# Patient Record
Sex: Female | Born: 1960 | Race: White | Hispanic: No | Marital: Married | Smoking: Never smoker
Health system: Southern US, Community
[De-identification: ages and names within clinical notes are randomized; demographics above are authoritative.]

## PROBLEM LIST (undated history)

## (undated) DIAGNOSIS — C801 Malignant (primary) neoplasm, unspecified: Secondary | ICD-10-CM

## (undated) DIAGNOSIS — F329 Major depressive disorder, single episode, unspecified: Secondary | ICD-10-CM

## (undated) DIAGNOSIS — I10 Essential (primary) hypertension: Secondary | ICD-10-CM

## (undated) DIAGNOSIS — I4891 Unspecified atrial fibrillation: Secondary | ICD-10-CM

## (undated) DIAGNOSIS — M1990 Unspecified osteoarthritis, unspecified site: Secondary | ICD-10-CM

## (undated) DIAGNOSIS — F32A Depression, unspecified: Secondary | ICD-10-CM

## (undated) DIAGNOSIS — M797 Fibromyalgia: Secondary | ICD-10-CM

## (undated) HISTORY — DX: Unspecified atrial fibrillation: I48.91

## (undated) HISTORY — PX: LASER ABLATION: SHX1947

## (undated) HISTORY — PX: TONSILLECTOMY: SUR1361

## (undated) HISTORY — PX: BACK SURGERY: SHX140

## (undated) HISTORY — PX: VARICOSE VEIN SURGERY: SHX832

## (undated) HISTORY — PX: FOOT OSTEOTOMY W/ PLANTAR FASCIA RELEASE: SHX1665

## (undated) HISTORY — DX: Unspecified osteoarthritis, unspecified site: M19.90

## (undated) HISTORY — PX: CARPAL TUNNEL RELEASE: SHX101

---

## 2003-03-06 ENCOUNTER — Encounter: Payer: Self-pay | Admitting: Neurosurgery

## 2003-03-06 ENCOUNTER — Inpatient Hospital Stay (HOSPITAL_COMMUNITY): Admission: RE | Admit: 2003-03-06 | Discharge: 2003-03-07 | Payer: Self-pay | Admitting: Neurosurgery

## 2003-06-18 ENCOUNTER — Other Ambulatory Visit: Admission: RE | Admit: 2003-06-18 | Discharge: 2003-06-18 | Payer: Self-pay | Admitting: *Deleted

## 2005-08-30 ENCOUNTER — Ambulatory Visit (HOSPITAL_COMMUNITY): Admission: RE | Admit: 2005-08-30 | Discharge: 2005-08-30 | Payer: Self-pay | Admitting: Internal Medicine

## 2006-05-23 ENCOUNTER — Ambulatory Visit (HOSPITAL_COMMUNITY): Admission: RE | Admit: 2006-05-23 | Discharge: 2006-05-23 | Payer: Self-pay | Admitting: Neurosurgery

## 2006-05-23 IMAGING — RF IR MYELOGRAM [PERSON_NAME]
13 of 14 series · 13 of 14 positions shown · IV contrast (omnipaque)
Comparison: none

CLINICAL DATA: Low back pain.
 MYELOGRAM ? LUMBAR :
TECHNIQUE: Lumbar puncture was performed by Dr. KLEVER at L3-4 in a left paramedian approach.  
 Contrast:  10 cc Omnipaque 300 IV.
TECHNIQUE: Contrast was maneuvered into the thoracic subarachnoid space.
TECHNIQUE: Multidetector CT imaging of the thoracic spine was performed after intrathecal injection of contrast.  Multiplanar CT image reconstructions were also generated.
TECHNIQUE: Multidetector CT imaging of the lumbar spine was performed after intrathecal injection of contrast.  Multiplanar CT image reconstructions were also generated.

[Series 1: run · 1 of 1 slices shown (1 of 11)]
[im 1/1]
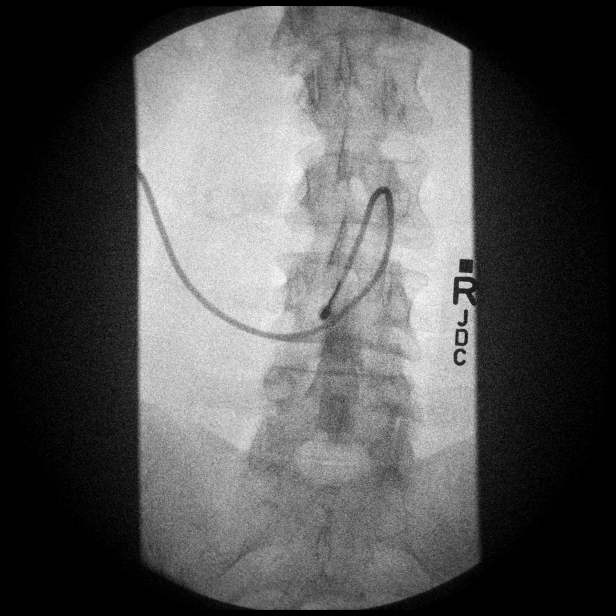

[Series 2: run · 1 of 1 slices shown (2 of 11)]
[im 1/1]
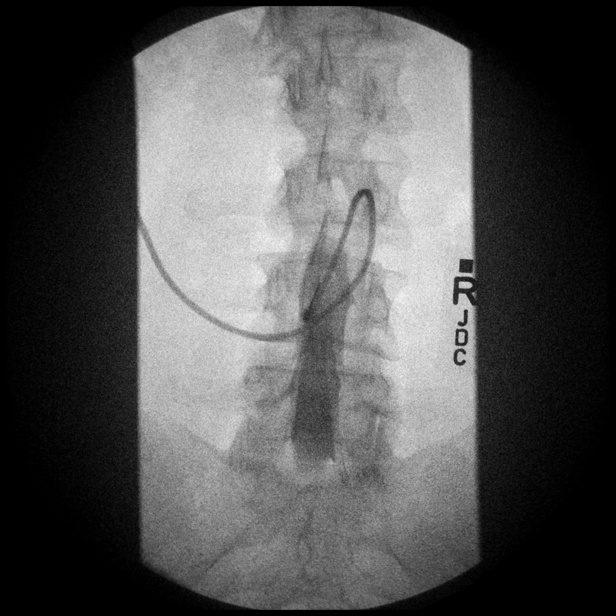

[Series 3: run · 1 of 1 slices shown (3 of 11)]
[im 1/1]
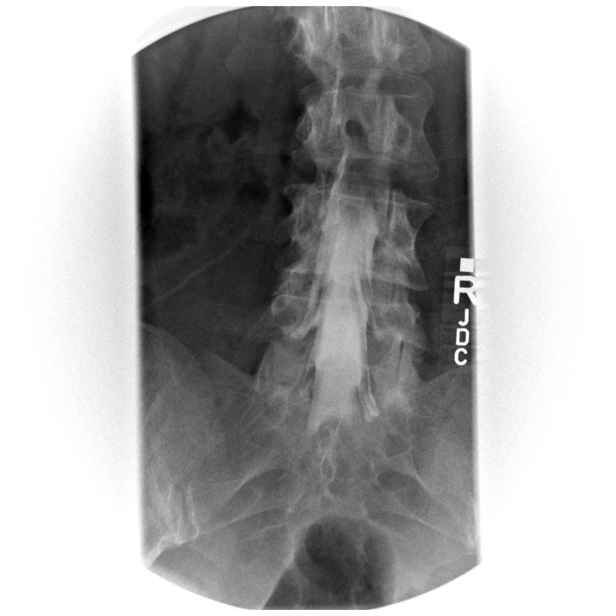

[Series 4: run · 1 of 1 slices shown (4 of 11)]
[im 1/1]
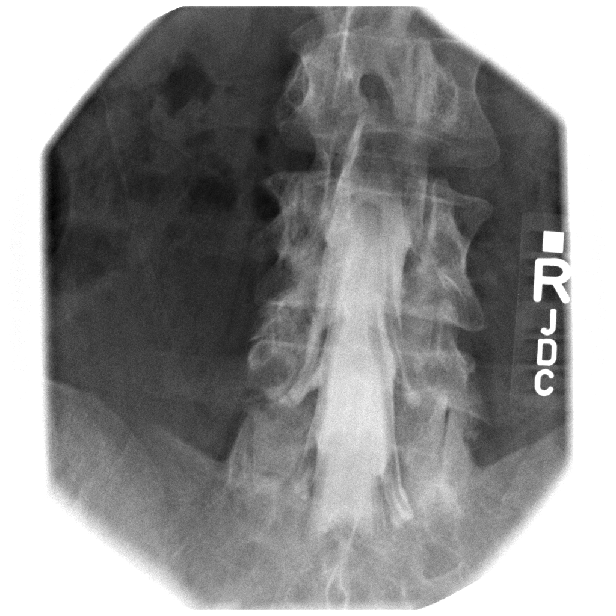

[Series 5: run · 1 of 1 slices shown (5 of 11)]
[im 1/1]
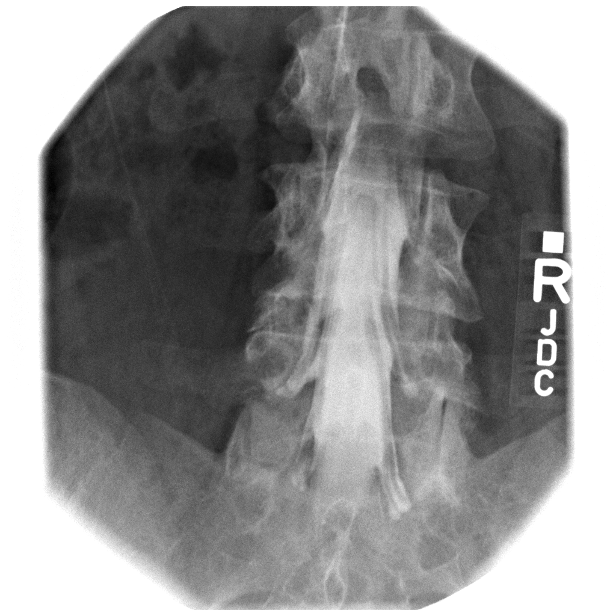

[Series 6: run · 1 of 1 slices shown (6 of 11)]
[im 1/1]
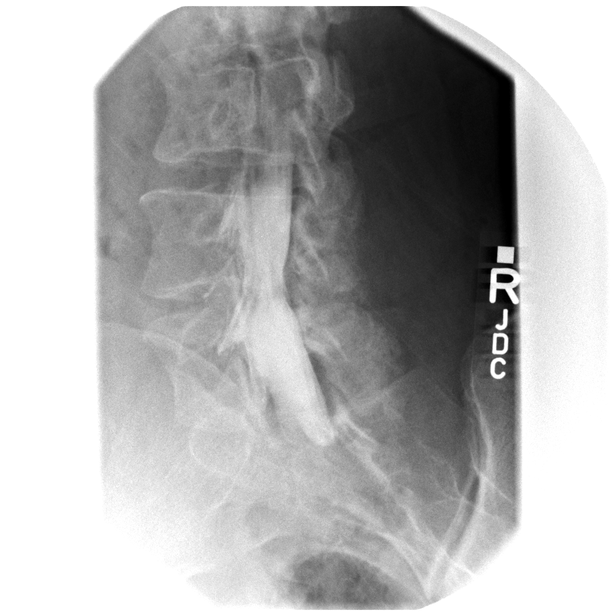

[Series 8: run · 1 of 1 slices shown (7 of 11)]
[im 1/1]
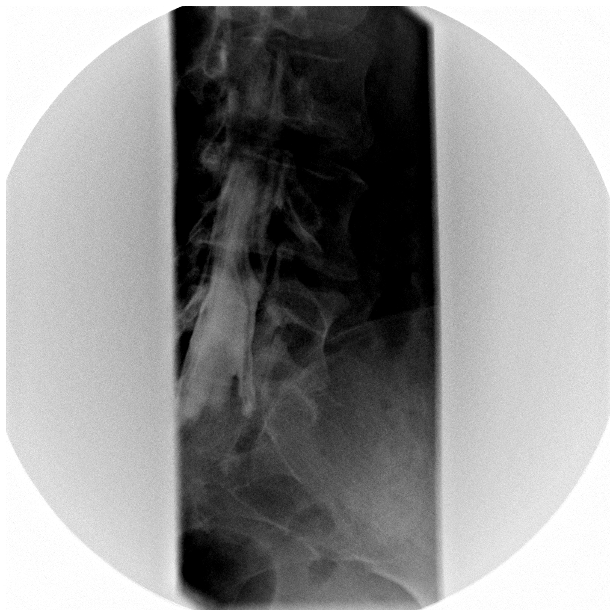

[Series 9: run · 1 of 1 slices shown (8 of 11)]
[im 1/1]
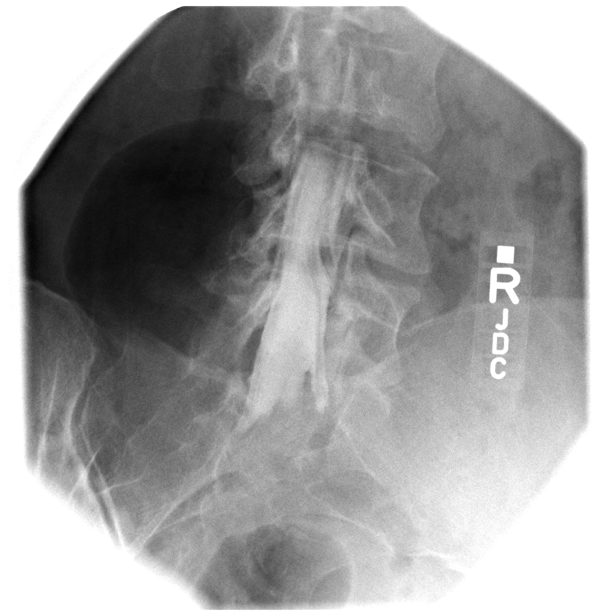

[Series 10: run · 1 of 1 slices shown (9 of 11)]
[im 1/1]
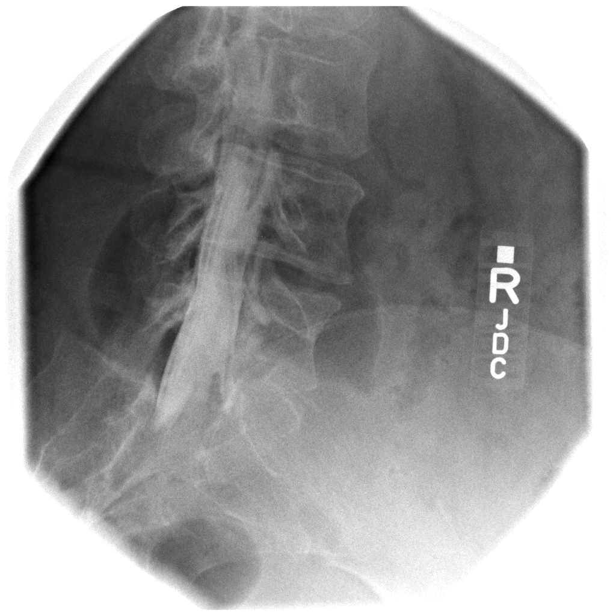

[Series 11: run · 1 of 1 slices shown (10 of 11)]
[im 1/1]
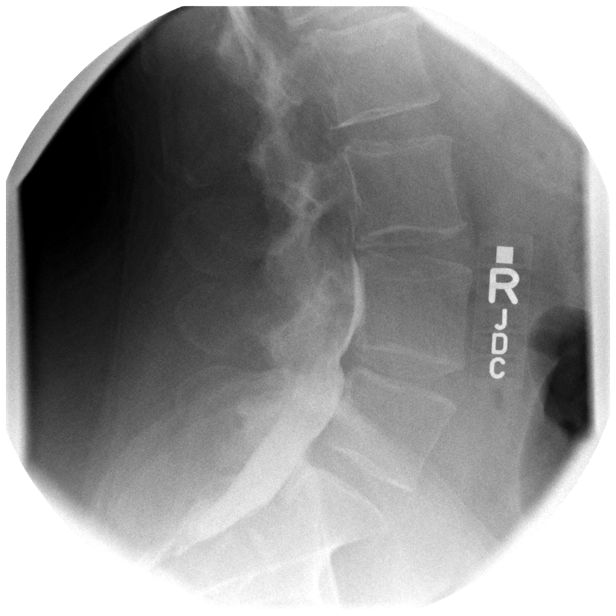

[Series 12: run · 1 of 1 slices shown (11 of 11)]
[im 1/1]
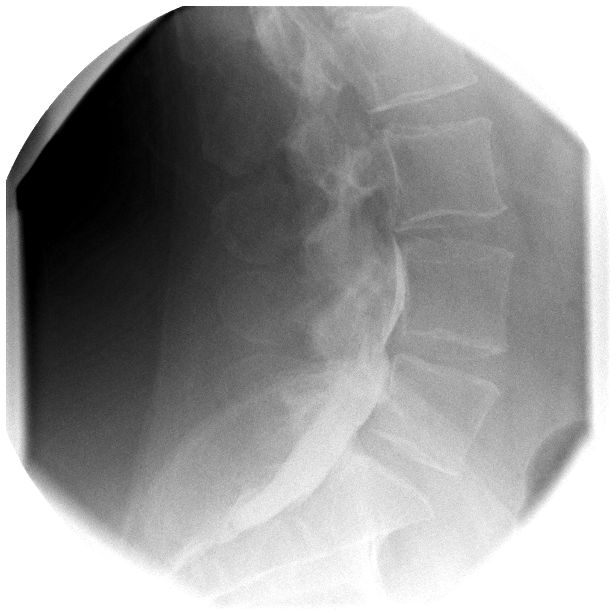

[Series 1001: view not recorded · 0.20mm/px · 1 of 1 slices shown (1 of 2)]
[im 1/1]
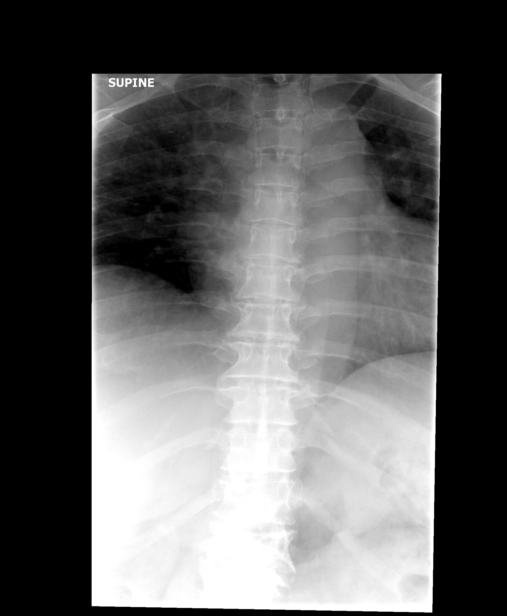

[Series 1002: view not recorded · 0.20mm/px · 1 of 1 slices shown (2 of 2)]
[im 1/1]
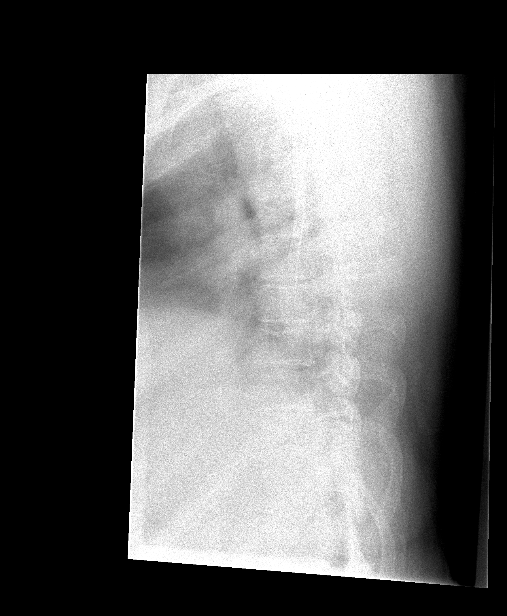

[13 of 14 positions shown; findings below may reference images not displayed]

FINDINGS: AP, lateral, and oblique views show a ventral defect at L4-5.  No definite L5 nerve root encroachment.  There is a wedge defect at L1 with slight retropulsion of bone.
IMPRESSION: As above.
 MYELOGRAM ? THORACIC:
FINDINGS: No frank cord compression is seen.  The conus is mildly displaced posteriorly by the superior end-plate of L1.
IMPRESSION: Thoracic defects as described.
 CT THORACIC SPINE WITH CONTRAST (POST-MYELOGRAM):
FINDINGS: Previous cervical fusion at C6 and above is incidentally noted.
 T1-2:   Normal interspace. 
 T2-3:  Normal interspace. 
 T3-4:  Normal interspace. 
 T4-5:  Normal interspace. 
 T5-6:  Normal interspace. 
 T6-7:  Normal interspace. 
 T7-8:  Normal interspace. 
 T8-9:  Shallow and partially calcified left paracentral protrusion.  Mild cord flattening.  
 T9-10:  Normal interspace. 
 T10-11:   Normal interspace. 
 T11-12:  Normal interspace. 
 T12-L1:  Calcified central protrusion on the right.  Mild stenosis with canal diameter of 10-11 mm.
IMPRESSION: 1.  Left paracentral protrusion, partially calcified, at T8-9, as described, with mild cord flattening.
 2.  Right paracentral protrusion at T12-L1 with mild posterior displacement of the conus.
 CT LUMBAR SPINE WITH CONTRAST (POST-MYELOGRAM):
FINDINGS: There is marked wedging of L1 with loss of approximately two/thirds of vertebral body height.  There is slight retropulsion of bone into the canal.
 L1-2:  Left paracentral protrusion along with asymmetric posterior displacement of the vertebral body on the left.  This compresses the left L1 and L2 nerve roots.  There may be associated soft disk protrusion at this level.
 L2-3:  Mild bulge.
 L3-4:  Mild bulge.
 L4-5:  Central protrusion with posterior element hypertrophy.  Mild bilateral L5 nerve root effacement is seen.
 L5-S1:  Mild facet arthropathy.  No stenosis or disk protrusion.
IMPRESSION: 1.  The dominant finding is significant compression fracture at L1 with retropulsion of bone and possibly disk material on the left at L1-2; left L1 and left L2 nerve root encroachment is present.  
 2.  Central protrusion at L4-5 associated with posterior element hypertrophy; moderate stenosis is present along with bilateral L5 nerve root effacement.

## 2006-05-23 IMAGING — CT CT L SPINE W/ CM
4 of 12 series · 10 of 33 positions shown, 11 images · IV contrast (omnipaque)
Comparison: none

CLINICAL DATA: Low back pain.
 MYELOGRAM ? LUMBAR :
TECHNIQUE: Lumbar puncture was performed by Dr. KLEVER at L3-4 in a left paramedian approach.  
 Contrast:  10 cc Omnipaque 300 IV.
TECHNIQUE: Contrast was maneuvered into the thoracic subarachnoid space.
TECHNIQUE: Multidetector CT imaging of the thoracic spine was performed after intrathecal injection of contrast.  Multiplanar CT image reconstructions were also generated.
TECHNIQUE: Multidetector CT imaging of the lumbar spine was performed after intrathecal injection of contrast.  Multiplanar CT image reconstructions were also generated.

[Series 2: t spine · axial · 0.33mm/px · z∈[-343,-180]mm · 2 of 196 slices shown, 3 images]
[im 66/196  soft-tissue]
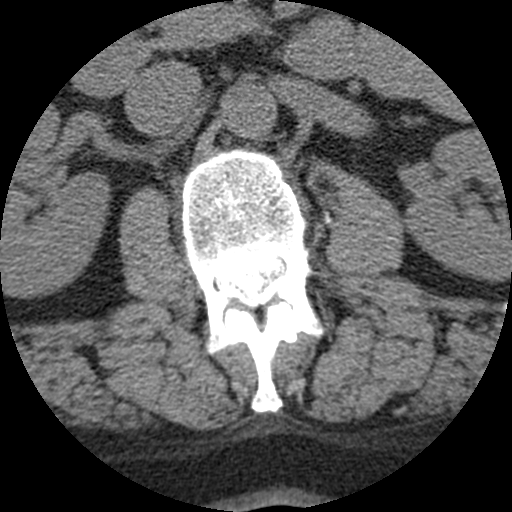
[im 66/196  bone]
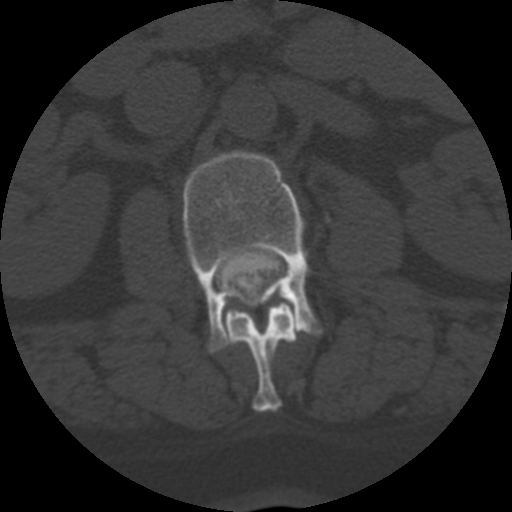
[im 131/196  bone]
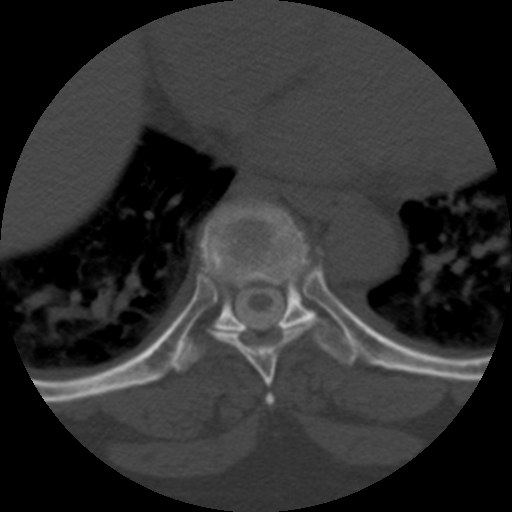

[Series 3: recon 2: t spine · axial · 0.33mm/px · z∈[-343,-180]mm · 2 of 196 slices shown]
[im 66/196  bone]
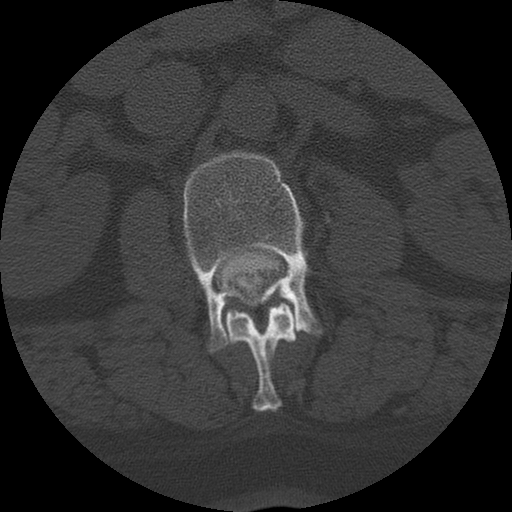
[im 131/196  bone]
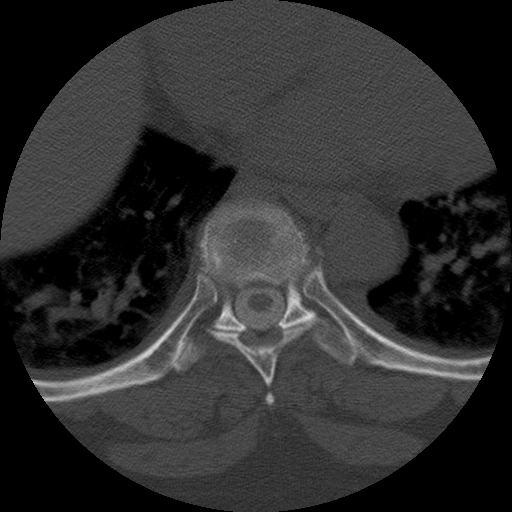

[Series 103: reformatted · coronal · 0.98mm/px · 1 of 60 slices shown (1 of 2)]
[im 30/60  bone]
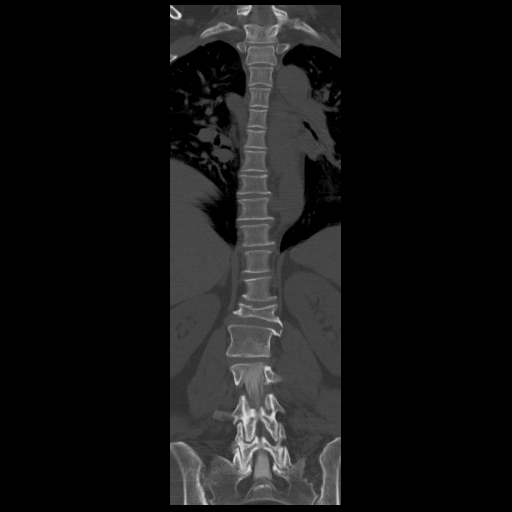

[Series 104: reformatted · sagittal · 0.98mm/px · 5 of 40 slices shown (2 of 2)]
[im 7/40  bone]
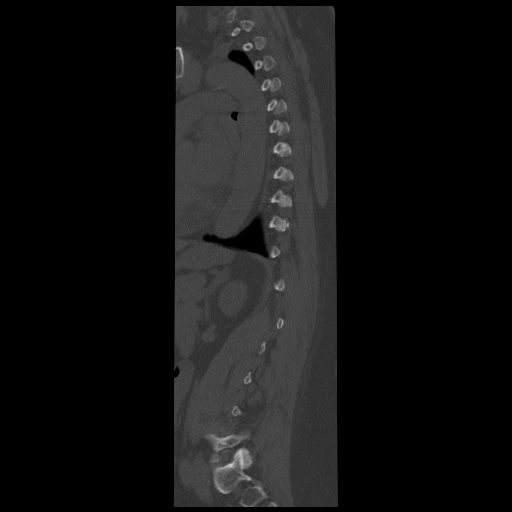
[im 14/40  bone]
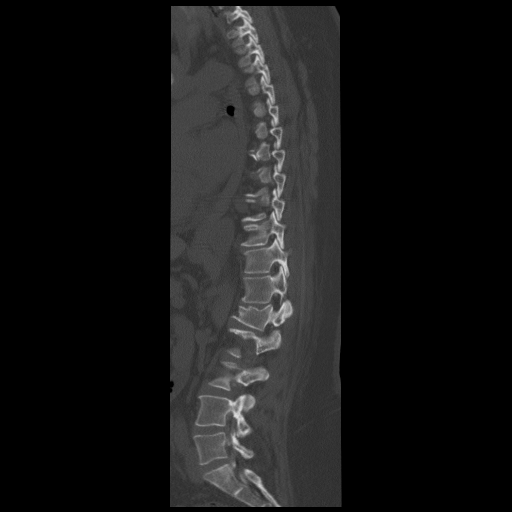
[im 20/40  bone]
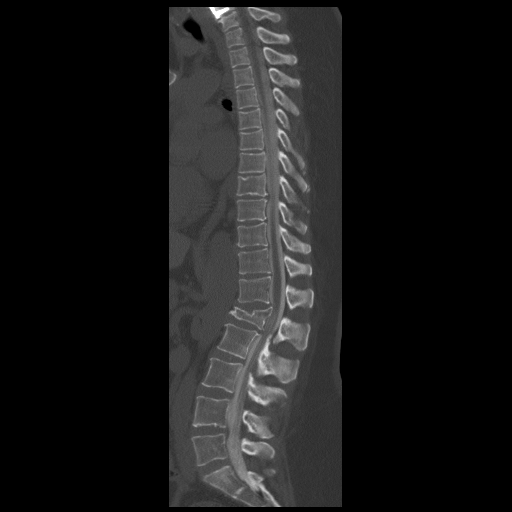
[im 27/40  bone]
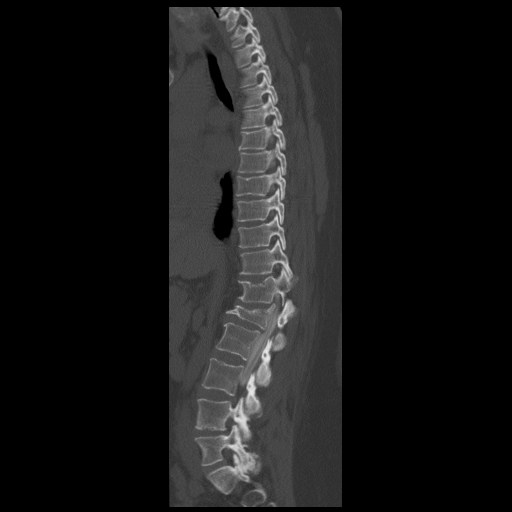
[im 33/40  bone]
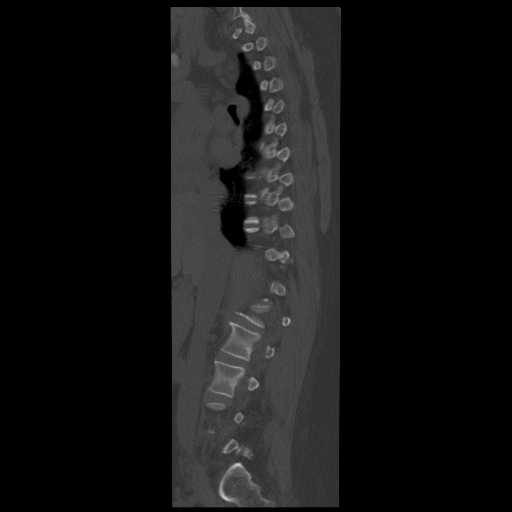

[10 of 33 positions shown; findings below may reference images not displayed]

FINDINGS: AP, lateral, and oblique views show a ventral defect at L4-5.  No definite L5 nerve root encroachment.  There is a wedge defect at L1 with slight retropulsion of bone.
IMPRESSION: As above.
 MYELOGRAM ? THORACIC:
FINDINGS: No frank cord compression is seen.  The conus is mildly displaced posteriorly by the superior end-plate of L1.
IMPRESSION: Thoracic defects as described.
 CT THORACIC SPINE WITH CONTRAST (POST-MYELOGRAM):
FINDINGS: Previous cervical fusion at C6 and above is incidentally noted.
 T1-2:   Normal interspace. 
 T2-3:  Normal interspace. 
 T3-4:  Normal interspace. 
 T4-5:  Normal interspace. 
 T5-6:  Normal interspace. 
 T6-7:  Normal interspace. 
 T7-8:  Normal interspace. 
 T8-9:  Shallow and partially calcified left paracentral protrusion.  Mild cord flattening.  
 T9-10:  Normal interspace. 
 T10-11:   Normal interspace. 
 T11-12:  Normal interspace. 
 T12-L1:  Calcified central protrusion on the right.  Mild stenosis with canal diameter of 10-11 mm.
IMPRESSION: 1.  Left paracentral protrusion, partially calcified, at T8-9, as described, with mild cord flattening.
 2.  Right paracentral protrusion at T12-L1 with mild posterior displacement of the conus.
 CT LUMBAR SPINE WITH CONTRAST (POST-MYELOGRAM):
FINDINGS: There is marked wedging of L1 with loss of approximately two/thirds of vertebral body height.  There is slight retropulsion of bone into the canal.
 L1-2:  Left paracentral protrusion along with asymmetric posterior displacement of the vertebral body on the left.  This compresses the left L1 and L2 nerve roots.  There may be associated soft disk protrusion at this level.
 L2-3:  Mild bulge.
 L3-4:  Mild bulge.
 L4-5:  Central protrusion with posterior element hypertrophy.  Mild bilateral L5 nerve root effacement is seen.
 L5-S1:  Mild facet arthropathy.  No stenosis or disk protrusion.
IMPRESSION: 1.  The dominant finding is significant compression fracture at L1 with retropulsion of bone and possibly disk material on the left at L1-2; left L1 and left L2 nerve root encroachment is present.  
 2.  Central protrusion at L4-5 associated with posterior element hypertrophy; moderate stenosis is present along with bilateral L5 nerve root effacement.

## 2006-08-31 ENCOUNTER — Ambulatory Visit (HOSPITAL_COMMUNITY): Admission: RE | Admit: 2006-08-31 | Discharge: 2006-08-31 | Payer: Self-pay | Admitting: Internal Medicine

## 2007-12-04 ENCOUNTER — Ambulatory Visit (HOSPITAL_COMMUNITY): Admission: RE | Admit: 2007-12-04 | Discharge: 2007-12-04 | Payer: Self-pay | Admitting: Internal Medicine

## 2007-12-19 ENCOUNTER — Encounter: Admission: RE | Admit: 2007-12-19 | Discharge: 2007-12-19 | Payer: Self-pay | Admitting: Internal Medicine

## 2008-12-05 ENCOUNTER — Encounter: Admission: RE | Admit: 2008-12-05 | Discharge: 2008-12-05 | Payer: Self-pay | Admitting: Internal Medicine

## 2009-04-30 ENCOUNTER — Encounter: Admission: RE | Admit: 2009-04-30 | Discharge: 2009-04-30 | Payer: Self-pay | Admitting: Neurosurgery

## 2009-04-30 IMAGING — CT CT L SPINE W/ CM
3 of 17 series · 5 of 33 positions shown, 6 images · non-contrast
Comparison: none

CLINICAL DATA: Back and neck pain.  History of prior cervical
fusion.  History of L1 compression fracture (traumatic).
TECHNIQUE: Contiguous axial images were obtained through the
Cervical spine without infusion. Coronal and sagittal
reconstructions were obtained of the axial image sets.
Thoracic spine without infusion. Coronal and sagittal
TECHNIQUE: Contiguous axial images were obtained through the lumbar
spine without infusion. Coronal, sagittal, and disc space

[Series 6: t spine soft · axial · 0.33mm/px · z∈[-280,+58]mm · 3 of 136 slices shown, 4 images]
[im 1/136  soft-tissue]
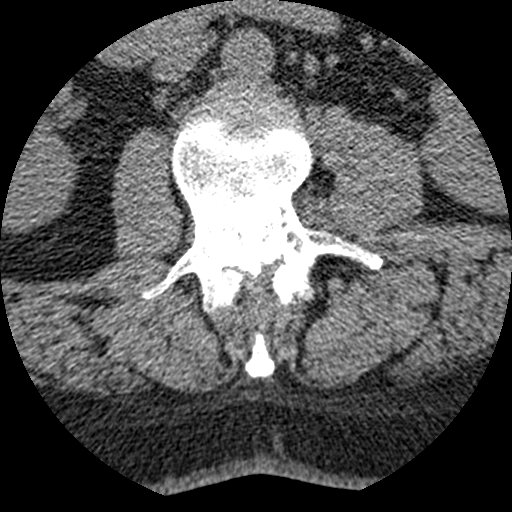
[im 1/136  bone]
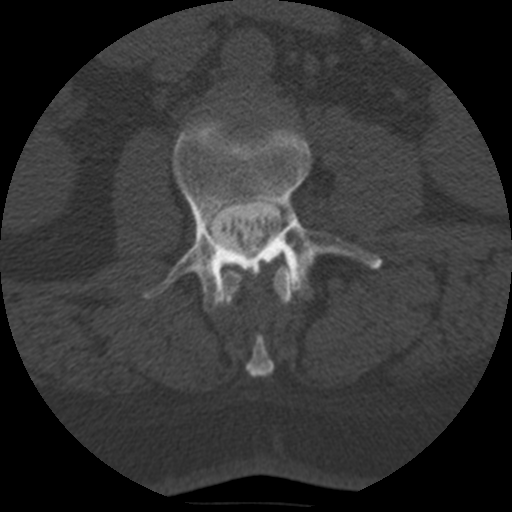
[im 68/136  bone]
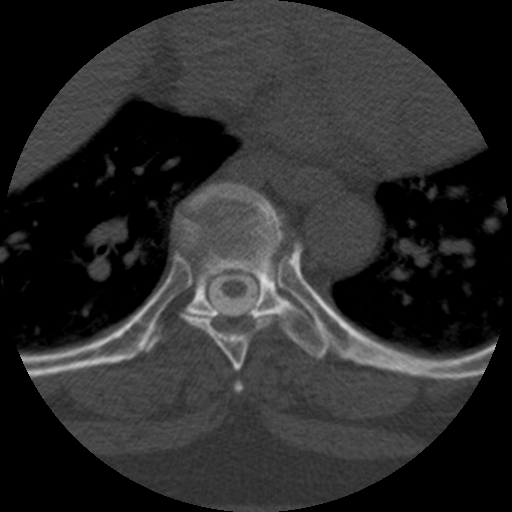
[im 136/136  bone]
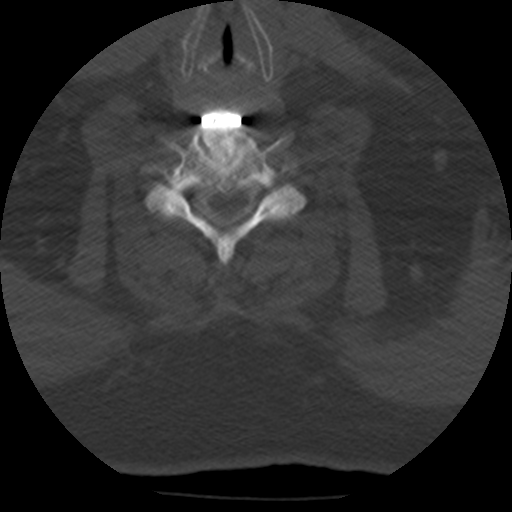

[Series 400: coronal · coronal · 0.44mm/px · 1 of 47 slices shown]
[im 24/47  bone]
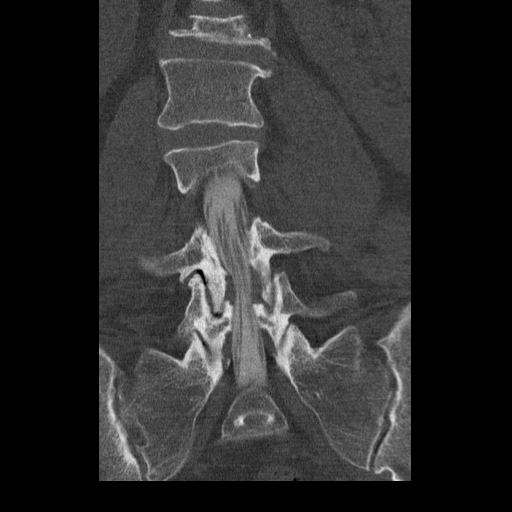

[Series 1000: sag c spine · sagittal · 0.30mm/px · 1 of 30 slices shown]
[im 15/30  bone]
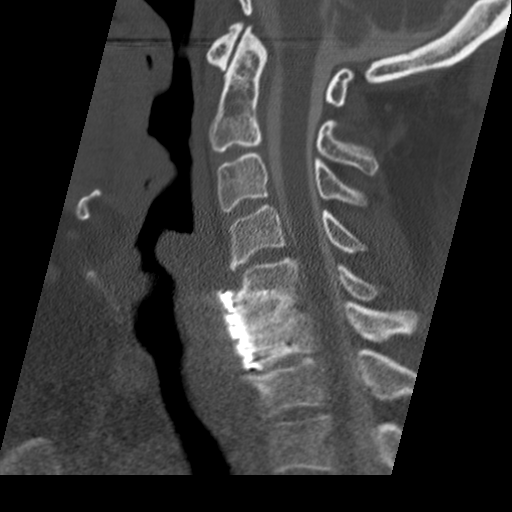

[5 of 33 positions shown; findings below may reference images not displayed]

INTRATHECAL INJECTION OF CONTRAST FOR CERVICAL AND LUMBAR
MYELOGRAM,CT LUMBAR SPINE WITH CONTRAST,CT CERVICAL SPINE WITH
CONTRAST,CT THORACIC SPINE WITH CONTRAST

Procedure: After thorough discussion of risks and benefits of the
procedure including bleeding, infection, injury to nerves, blood
vessels, adjacent structures as well as headache and CSF leak,
written and oral informed consent was obtained.   Consent was
obtained by Dr.KARAOGLAN.

Patient was positioned prone on the fluoroscopy table. Local
anesthesia was provided with 1% lidocaine without epinephrine after
prepped and draped in the usual sterile fashion. Puncture was
performed at L3-L4 using a 5 inches 22-gauge spinal needle via
right paramedian approach.  Using a single pass through the dura,
the needle was placed within the thecal sac, with return of clear
CSF. 15 mL of [9V] was injected into the thecal sac, with
normal opacification of the nerve roots and cauda equina consistent
with free flow within the subarachnoid space.

Fluoroscopy time: 4 minutes 49 seconds
FINDINGS: Cervical myelogram demonstrates C5-C6 anterior cervical
discectomy fusion plate.  Dilution of contrast and patient body
habitus produce suboptimal evaluation on plain film myelogram of
the cervical spine.  The thoracic spine demonstrates a small
anterior extradural impressions at T8-T9 and T9-T10.  The lumbar
spine shows a dextroconvex thoracolumbar scoliosis, probably
associated with remote L1 compression fracture.  Retropulsion of
the L1 vertebral body produces moderate central canal stenosis.
There is accelerated degenerative disease at L1-L2 with anterior
extradural impression.
IMPRESSION: 1.  Technically successful lumbar puncture for lumbar myelogram.
2.  C5-C6 anterior cervical discectomy fusion.
3.  Chronic L1 compression fracture with retropulsion.
4.  Degenerative disease of the thoracic and lumbar spine most
pronounced at T8-T9 and T9-T10.
5.  L1-L2 degenerative disc disease with anterior extradural
impression.

CT CERVICAL MYELOGRAM
FINDINGS: The alignment is nearly anatomic with 2 mm of
anterolisthesis of C4 on C5.  Anterior cervical discectomy fusion
plate is present at C5-C6.  There is no evidence of hardware
loosening or failure.  Small amount of bridging bone is present
consistent with completed fusion, best seen on coronal image number
11.  Cervical cord demonstrates a normal caliber.  Atlantodental
degenerative disease.  Craniocervical alignment is normal.

C2-C3:  Mild right uncovertebral spurring is present which has mass
effect on the ventral C3 nerve root.  Minimal central stenosis.
Right foraminal stenosis is mild.
C3-C4:  Mild to moderate left greater than right facet arthrosis
and uncovertebral spurring produce right foraminal stenosis without
significant left foraminal stenosis.  Central canal is adequately
patent.
C4-C5:  2 millimeters anterolisthesis of C4 on C5. Mild uncoverage
of the disc and a shallow broad-based posterior disc bulge produces
mild central canal stenosis.  The AP diameter of the central canal
is 1 cm.  Neural foramina appear patent.  There is mild flattening
of the cervical cord associated with the broad-based posterior disc
bulge.
C5-C6:  There is some posterior osseous ridging at C6, with this
exerts mass effect on the thecal sac and contacts the ventral
aspect of the cervical cord.  No foraminal stenosis is identified.
The hardware appears within normal limits.
C6-C7:  Degenerated disc with loss of height and anterior
osteophyte.  Mild right-sided uncovertebral spurring produces right
foraminal narrowing.  The facets appear within normal limits.
C7-T1:  Mild bilateral facet arthrosis, slightly worse on the
right.  No central stenosis.
IMPRESSION: 1.  C5-C6 anterior cervical discectomy fusion.  Posterior osseous
ridging is present inferior to C6 which produces mass effect on the
ventral aspect of the thecal sac and indents the cervical cord.
Foramina patent.
2.  2 mm of anterolisthesis of C4 on C5 with uncoverage of the
disc.  Mild central stenosis.
3.  Right C2-C3 uncovertebral spurring with mass effect on the
ventral C3 nerve root.
4.  Right C3-C4 foraminal stenosis associated with facet arthrosis
and uncovertebral spurring.

CT THORACIC MYELOGRAM
FINDINGS: There is a very mild S-shaped thoracic scoliosis.
Prominent Schmorl's node in the superior T6 endplate.  Large
Schmorl's node is also present in the superior T12 endplate.  Aside
from levels mentioned below, there is no significant stenosis.

T4-T5:  Minimal left-sided disc protrusion and posterior
longitudinal ligament thickening without significant stenosis.

T7-T8:  Shallow broad-based disc protrusion with minimal mass
effect on the thecal sac.
T8-T9:  Central and left paracentral disc protrusion with endplate
osteophytes produce mild to moderate central canal stenosis with
effacement of ventral CSF space.  This disc osteophyte complex
contacts the ventral aspect of the thoracic cord, indenting it
slightly.  The dorsal CSF space preserved.  AP diameter of the
spinal canal is 1 cm.
T9-T10:  Minimal disc bulging without stenosis.
IMPRESSION: Mild thoracic spine degenerative disease most pronounced at T8-T9
where there is a disc osteophyte complex that produces mild to
moderate central canal stenosis.  Disc osteophyte complex contacts
and indents the ventral aspect of the thoracic cord, displacing it
slightly posterior.

CT LUMBAR MYELOGRAM
FINDINGS: The paraspinal soft tissues appear within normal limits.
Dextroconvex lumbar scoliosis centered around L1-L2.  L1
compression fracture with 75% loss of anterior vertebral body
height and retropulsion of the posterior vertebral body.  Five
lumbar type vertebral bodies are identified.

T12-L1: There is moderate central stenosis associated with
retropulsion of the L1 vertebral body.  Mild bilateral facet
arthrosis. The neural foramina appear adequately patent.  Mild
bilateral lateral recess stenosis. Bone from the compression
fracture in the left extraforaminal region contacts the exiting
left a L1 nerve root.

L1-L2: Retropulsion of the L1 vertebral body and disc protrusion at
L1-L2 produce mild to moderate central canal stenosis.  There is a
broad-based posterior disc bulge, which has endplate osteophytes.
There is prominent osteophyte in the left paracentral region
impressing on the thecal sac and narrowing of the left lateral
recess. Mild left foraminal stenosis that is multifactorial.

L2-L3: Small amount of calcification in the disc.  Disc height is
preserved.  Facet hypertrophy and mild ligamentum flavum redundancy
indents the left posterior aspect of the thecal sac.  No neural
compression.  Foramina appear adequately patent bilaterally.

L3-L4: Relative preservation of disc height.  Small left foraminal
protrusion is present which may contact the exiting left L3 nerve
root in the lateral aspect of the foramen.  No definite neural
compression.  Right neural foramen appears adequately patent.
Lateral recesses patent. Moderate bilateral facet arthrosis, with
mild spurring.

L4-L5: Mild loss of disc height with shallow posterior bulging.  A
combination of facet arthrosis, scoliosis and disc bulging narrows
the right neural foramen.  Minimal bilateral lateral recess
narrowing without compression.  The left neural foramen is patent.
Severe bilateral facet arthrosis is present with vacuum joint on
the right.  Mild central canal stenosis predominately secondary to
ligamentum flavum redundancy and facet hypertrophy.

L5-S1: Severe right greater than left facet arthrosis.  There is a
small central disc protrusion that indents the ventral aspect of
the thecal sac in the right paracentral location.  Minimal mass
effect on the right lateral recess, potentially affecting the right
S1 nerve root.  Severe right facet spurring also encroaches on the
right lateral recess.  The left neural foramen is patent.  Despite
the degenerated facet, right foraminal stenosis is mild.
IMPRESSION: 1.  Chronic L1 compression fracture with retropulsion appears
unchanged compared to prior exam.  Moderate central canal stenosis
with retropulsed disc contacting and displacing the conus
medullaris.  Adjacent L1-L2 degenerative disc disease and
retropulsion, with left-sided central stenosis.
2.  Multilevel severe facet arthrosis in the lower lumbar spine.
3.  L5-S1 central disc protrusion with mild mass effect on the
thecal sac.  This protrusion and facet hypertrophy/spurring
encroach on the the right lateral recess, potentially affecting the
right S1 nerve root.

## 2009-04-30 IMAGING — RF IR MYELOGRAM [PERSON_NAME]
9 of 24 series · 9 of 24 positions shown · non-contrast
Comparison: none

CLINICAL DATA: Back and neck pain.  History of prior cervical
fusion.  History of L1 compression fracture (traumatic).
TECHNIQUE: Contiguous axial images were obtained through the
Cervical spine without infusion. Coronal and sagittal
reconstructions were obtained of the axial image sets.
Thoracic spine without infusion. Coronal and sagittal
TECHNIQUE: Contiguous axial images were obtained through the lumbar
spine without infusion. Coronal, sagittal, and disc space

[Series 2: myelogram  white · 1 of 1 slices shown (1 of 9)]
[im 1/1]
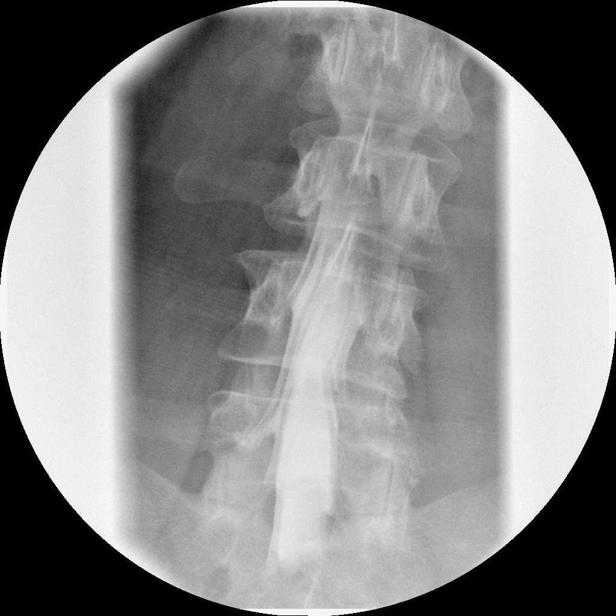

[Series 5: myelogram  white · 1 of 1 slices shown (2 of 9)]
[im 1/1]
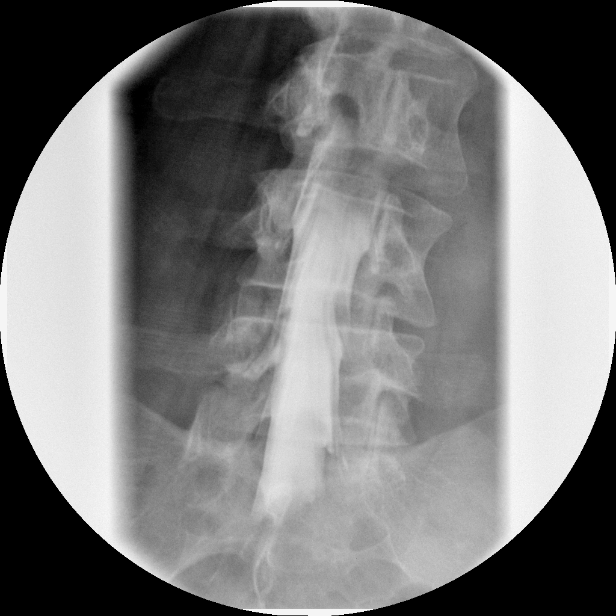

[Series 7: myelogram  white · 1 of 1 slices shown (3 of 9)]
[im 1/1]
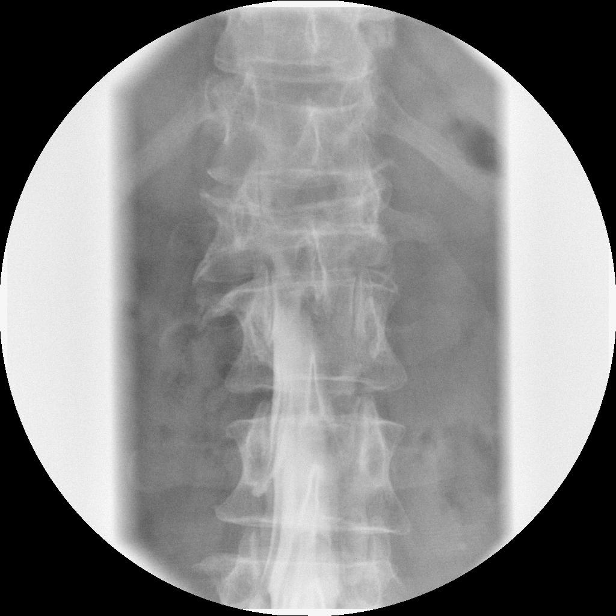

[Series 10: myelogram  white · 1 of 1 slices shown (4 of 9)]
[im 1/1]
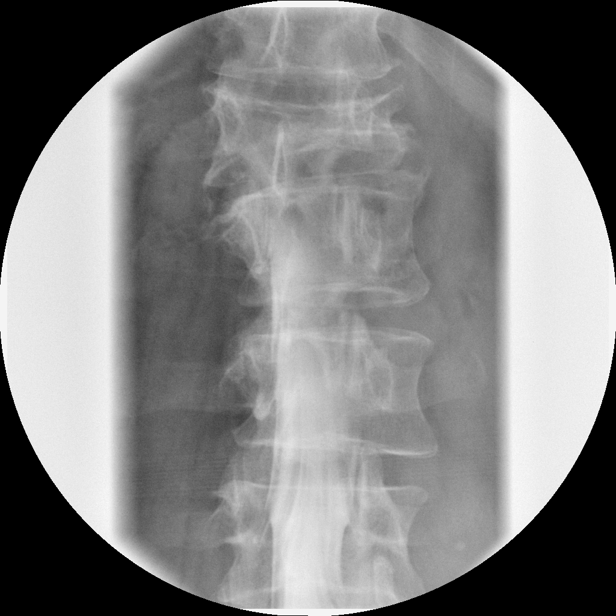

[Series 13: myelogram  white · 1 of 1 slices shown (5 of 9)]
[im 1/1]
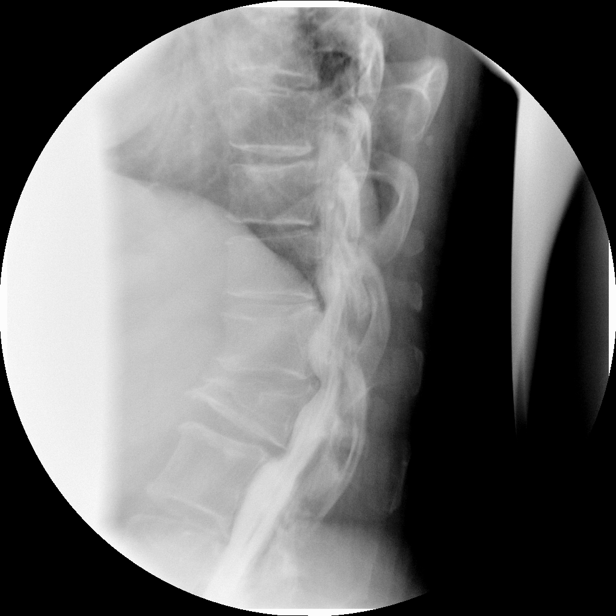

[Series 15: myelogram  white · 1 of 1 slices shown (6 of 9)]
[im 1/1]
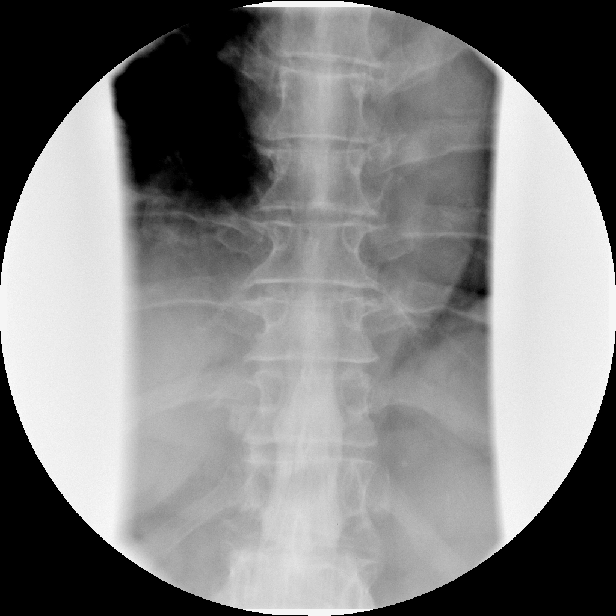

[Series 18: myelogram  white · 1 of 1 slices shown (7 of 9)]
[im 1/1]
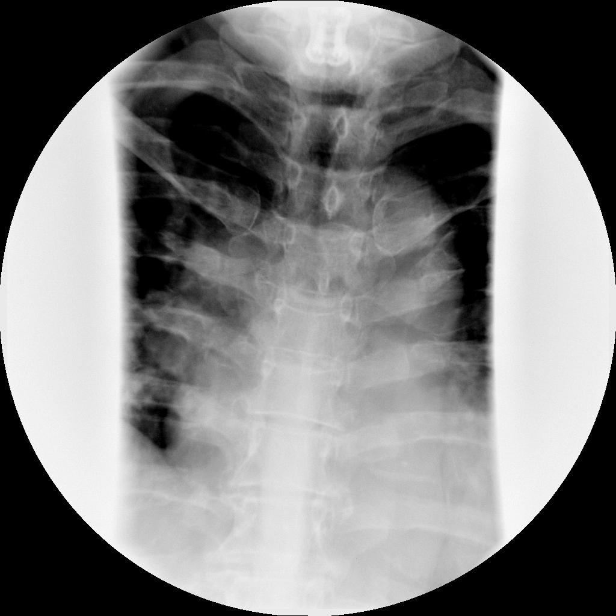

[Series 21: myelogram  white · 1 of 1 slices shown (8 of 9)]
[im 1/1]
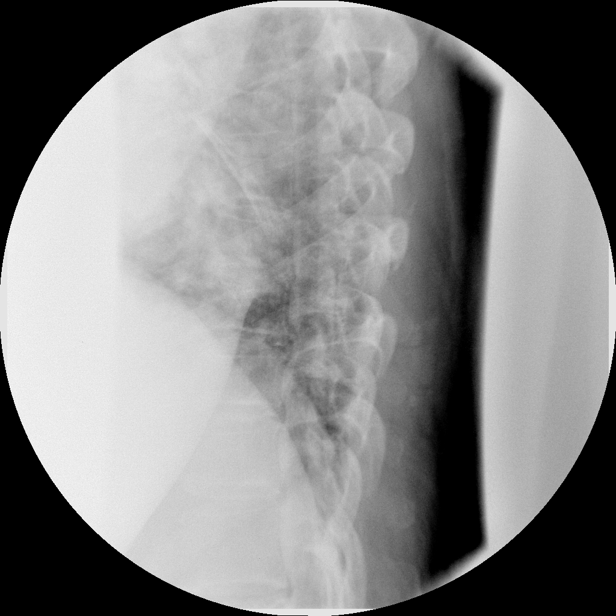

[Series 23: myelogram  white · 1 of 1 slices shown (9 of 9)]
[im 1/1]
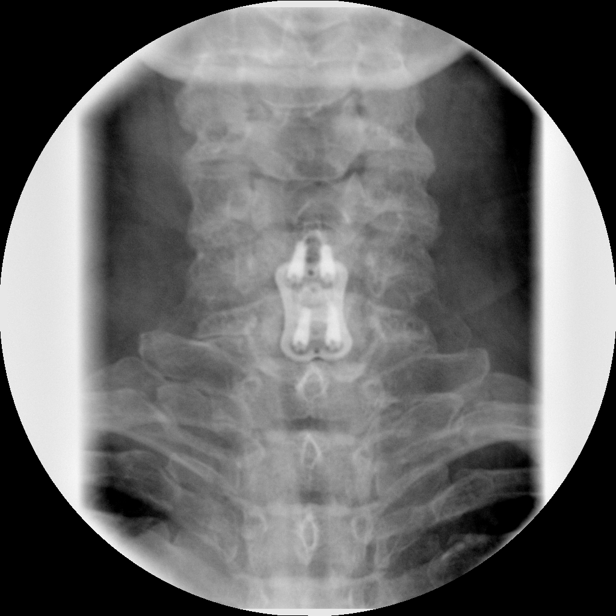

[9 of 24 positions shown; findings below may reference images not displayed]

INTRATHECAL INJECTION OF CONTRAST FOR CERVICAL AND LUMBAR
MYELOGRAM,CT LUMBAR SPINE WITH CONTRAST,CT CERVICAL SPINE WITH
CONTRAST,CT THORACIC SPINE WITH CONTRAST

Procedure: After thorough discussion of risks and benefits of the
procedure including bleeding, infection, injury to nerves, blood
vessels, adjacent structures as well as headache and CSF leak,
written and oral informed consent was obtained.   Consent was
obtained by Dr.KARAOGLAN.

Patient was positioned prone on the fluoroscopy table. Local
anesthesia was provided with 1% lidocaine without epinephrine after
prepped and draped in the usual sterile fashion. Puncture was
performed at L3-L4 using a 5 inches 22-gauge spinal needle via
right paramedian approach.  Using a single pass through the dura,
the needle was placed within the thecal sac, with return of clear
CSF. 15 mL of [9V] was injected into the thecal sac, with
normal opacification of the nerve roots and cauda equina consistent
with free flow within the subarachnoid space.

Fluoroscopy time: 4 minutes 49 seconds
FINDINGS: Cervical myelogram demonstrates C5-C6 anterior cervical
discectomy fusion plate.  Dilution of contrast and patient body
habitus produce suboptimal evaluation on plain film myelogram of
the cervical spine.  The thoracic spine demonstrates a small
anterior extradural impressions at T8-T9 and T9-T10.  The lumbar
spine shows a dextroconvex thoracolumbar scoliosis, probably
associated with remote L1 compression fracture.  Retropulsion of
the L1 vertebral body produces moderate central canal stenosis.
There is accelerated degenerative disease at L1-L2 with anterior
extradural impression.
IMPRESSION: 1.  Technically successful lumbar puncture for lumbar myelogram.
2.  C5-C6 anterior cervical discectomy fusion.
3.  Chronic L1 compression fracture with retropulsion.
4.  Degenerative disease of the thoracic and lumbar spine most
pronounced at T8-T9 and T9-T10.
5.  L1-L2 degenerative disc disease with anterior extradural
impression.

CT CERVICAL MYELOGRAM
FINDINGS: The alignment is nearly anatomic with 2 mm of
anterolisthesis of C4 on C5.  Anterior cervical discectomy fusion
plate is present at C5-C6.  There is no evidence of hardware
loosening or failure.  Small amount of bridging bone is present
consistent with completed fusion, best seen on coronal image number
11.  Cervical cord demonstrates a normal caliber.  Atlantodental
degenerative disease.  Craniocervical alignment is normal.

C2-C3:  Mild right uncovertebral spurring is present which has mass
effect on the ventral C3 nerve root.  Minimal central stenosis.
Right foraminal stenosis is mild.
C3-C4:  Mild to moderate left greater than right facet arthrosis
and uncovertebral spurring produce right foraminal stenosis without
significant left foraminal stenosis.  Central canal is adequately
patent.
C4-C5:  2 millimeters anterolisthesis of C4 on C5. Mild uncoverage
of the disc and a shallow broad-based posterior disc bulge produces
mild central canal stenosis.  The AP diameter of the central canal
is 1 cm.  Neural foramina appear patent.  There is mild flattening
of the cervical cord associated with the broad-based posterior disc
bulge.
C5-C6:  There is some posterior osseous ridging at C6, with this
exerts mass effect on the thecal sac and contacts the ventral
aspect of the cervical cord.  No foraminal stenosis is identified.
The hardware appears within normal limits.
C6-C7:  Degenerated disc with loss of height and anterior
osteophyte.  Mild right-sided uncovertebral spurring produces right
foraminal narrowing.  The facets appear within normal limits.
C7-T1:  Mild bilateral facet arthrosis, slightly worse on the
right.  No central stenosis.
IMPRESSION: 1.  C5-C6 anterior cervical discectomy fusion.  Posterior osseous
ridging is present inferior to C6 which produces mass effect on the
ventral aspect of the thecal sac and indents the cervical cord.
Foramina patent.
2.  2 mm of anterolisthesis of C4 on C5 with uncoverage of the
disc.  Mild central stenosis.
3.  Right C2-C3 uncovertebral spurring with mass effect on the
ventral C3 nerve root.
4.  Right C3-C4 foraminal stenosis associated with facet arthrosis
and uncovertebral spurring.

CT THORACIC MYELOGRAM
FINDINGS: There is a very mild S-shaped thoracic scoliosis.
Prominent Schmorl's node in the superior T6 endplate.  Large
Schmorl's node is also present in the superior T12 endplate.  Aside
from levels mentioned below, there is no significant stenosis.

T4-T5:  Minimal left-sided disc protrusion and posterior
longitudinal ligament thickening without significant stenosis.

T7-T8:  Shallow broad-based disc protrusion with minimal mass
effect on the thecal sac.
T8-T9:  Central and left paracentral disc protrusion with endplate
osteophytes produce mild to moderate central canal stenosis with
effacement of ventral CSF space.  This disc osteophyte complex
contacts the ventral aspect of the thoracic cord, indenting it
slightly.  The dorsal CSF space preserved.  AP diameter of the
spinal canal is 1 cm.
T9-T10:  Minimal disc bulging without stenosis.
IMPRESSION: Mild thoracic spine degenerative disease most pronounced at T8-T9
where there is a disc osteophyte complex that produces mild to
moderate central canal stenosis.  Disc osteophyte complex contacts
and indents the ventral aspect of the thoracic cord, displacing it
slightly posterior.

CT LUMBAR MYELOGRAM
FINDINGS: The paraspinal soft tissues appear within normal limits.
Dextroconvex lumbar scoliosis centered around L1-L2.  L1
compression fracture with 75% loss of anterior vertebral body
height and retropulsion of the posterior vertebral body.  Five
lumbar type vertebral bodies are identified.

T12-L1: There is moderate central stenosis associated with
retropulsion of the L1 vertebral body.  Mild bilateral facet
arthrosis. The neural foramina appear adequately patent.  Mild
bilateral lateral recess stenosis. Bone from the compression
fracture in the left extraforaminal region contacts the exiting
left a L1 nerve root.

L1-L2: Retropulsion of the L1 vertebral body and disc protrusion at
L1-L2 produce mild to moderate central canal stenosis.  There is a
broad-based posterior disc bulge, which has endplate osteophytes.
There is prominent osteophyte in the left paracentral region
impressing on the thecal sac and narrowing of the left lateral
recess. Mild left foraminal stenosis that is multifactorial.

L2-L3: Small amount of calcification in the disc.  Disc height is
preserved.  Facet hypertrophy and mild ligamentum flavum redundancy
indents the left posterior aspect of the thecal sac.  No neural
compression.  Foramina appear adequately patent bilaterally.

L3-L4: Relative preservation of disc height.  Small left foraminal
protrusion is present which may contact the exiting left L3 nerve
root in the lateral aspect of the foramen.  No definite neural
compression.  Right neural foramen appears adequately patent.
Lateral recesses patent. Moderate bilateral facet arthrosis, with
mild spurring.

L4-L5: Mild loss of disc height with shallow posterior bulging.  A
combination of facet arthrosis, scoliosis and disc bulging narrows
the right neural foramen.  Minimal bilateral lateral recess
narrowing without compression.  The left neural foramen is patent.
Severe bilateral facet arthrosis is present with vacuum joint on
the right.  Mild central canal stenosis predominately secondary to
ligamentum flavum redundancy and facet hypertrophy.

L5-S1: Severe right greater than left facet arthrosis.  There is a
small central disc protrusion that indents the ventral aspect of
the thecal sac in the right paracentral location.  Minimal mass
effect on the right lateral recess, potentially affecting the right
S1 nerve root.  Severe right facet spurring also encroaches on the
right lateral recess.  The left neural foramen is patent.  Despite
the degenerated facet, right foraminal stenosis is mild.
IMPRESSION: 1.  Chronic L1 compression fracture with retropulsion appears
unchanged compared to prior exam.  Moderate central canal stenosis
with retropulsed disc contacting and displacing the conus
medullaris.  Adjacent L1-L2 degenerative disc disease and
retropulsion, with left-sided central stenosis.
2.  Multilevel severe facet arthrosis in the lower lumbar spine.
3.  L5-S1 central disc protrusion with mild mass effect on the
thecal sac.  This protrusion and facet hypertrophy/spurring
encroach on the the right lateral recess, potentially affecting the
right S1 nerve root.

## 2009-04-30 IMAGING — CR IR MYELOGRAM [PERSON_NAME]
1 series · 1 of 1 positions shown · non-contrast
Comparison: none

CLINICAL DATA: Back and neck pain.  History of prior cervical
fusion.  History of L1 compression fracture (traumatic).
TECHNIQUE: Contiguous axial images were obtained through the
Cervical spine without infusion. Coronal and sagittal
reconstructions were obtained of the axial image sets.
Thoracic spine without infusion. Coronal and sagittal
TECHNIQUE: Contiguous axial images were obtained through the lumbar
spine without infusion. Coronal, sagittal, and disc space

[view not recorded]
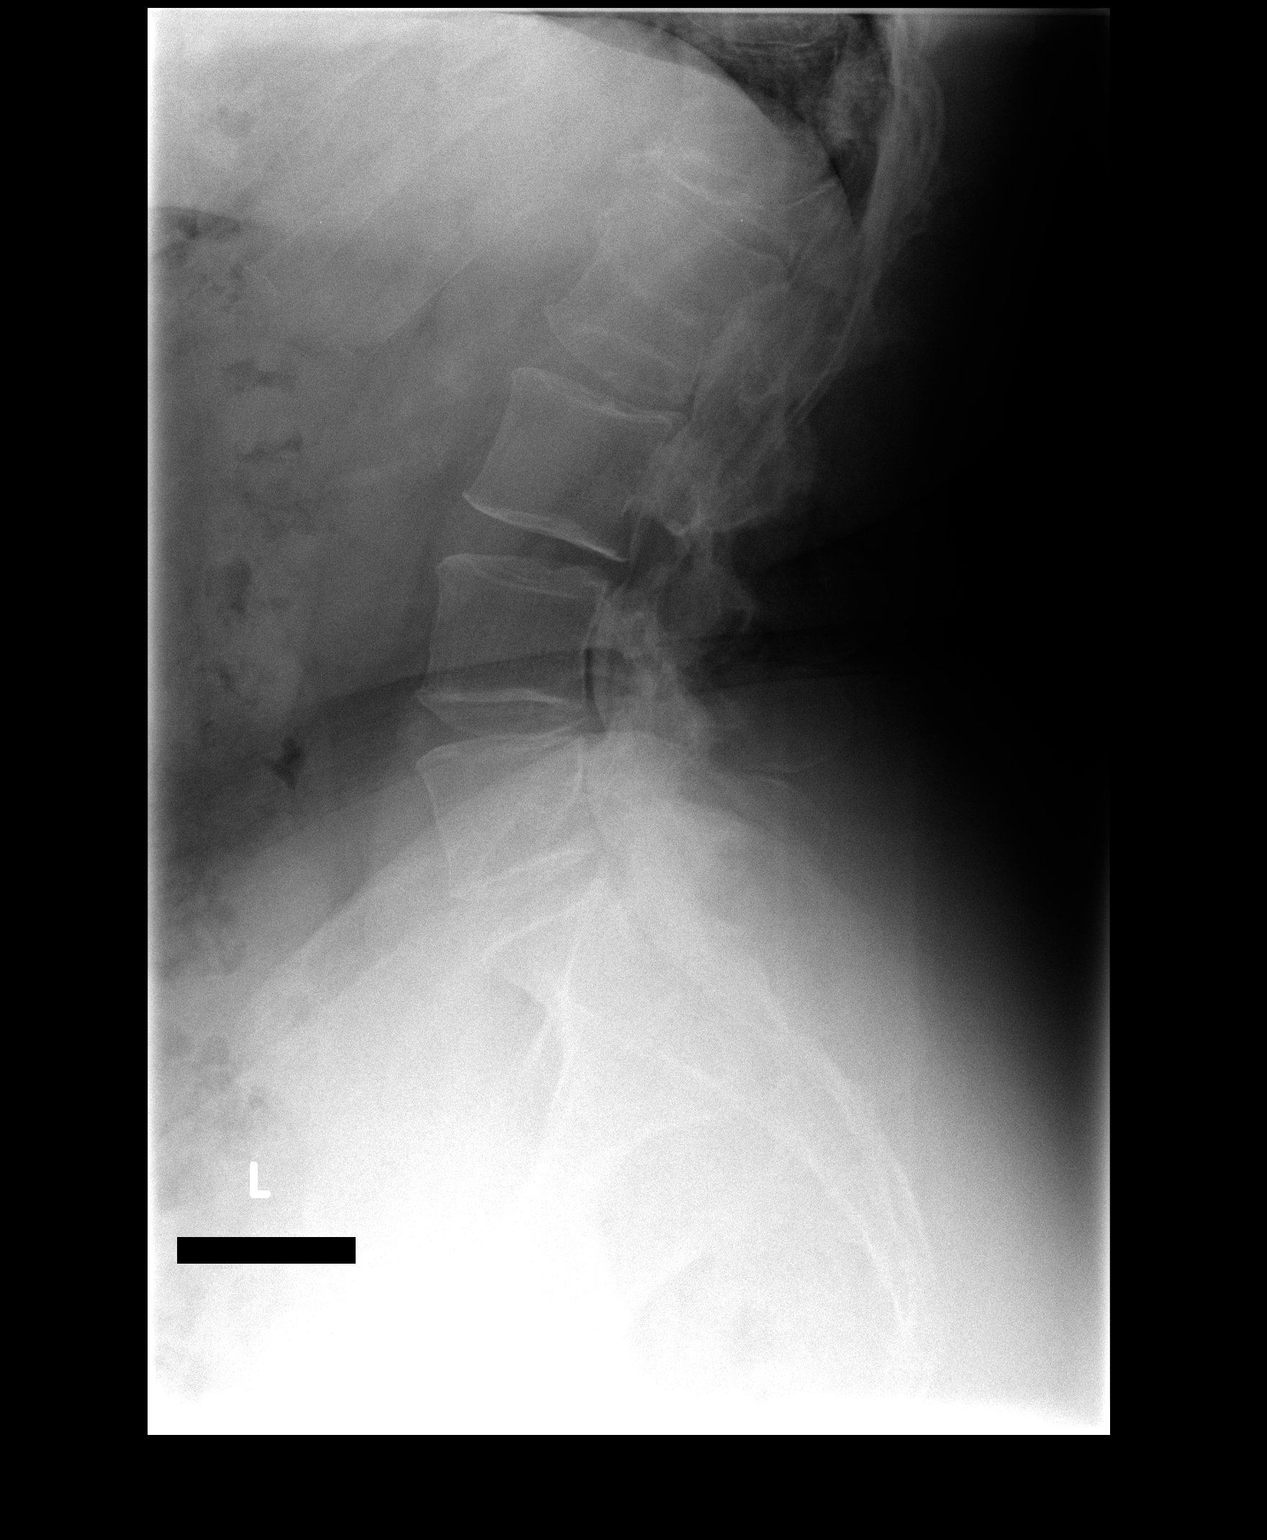

[1 of 1 positions shown; findings below may reference images not displayed]

INTRATHECAL INJECTION OF CONTRAST FOR CERVICAL AND LUMBAR
MYELOGRAM,CT LUMBAR SPINE WITH CONTRAST,CT CERVICAL SPINE WITH
CONTRAST,CT THORACIC SPINE WITH CONTRAST

Procedure: After thorough discussion of risks and benefits of the
procedure including bleeding, infection, injury to nerves, blood
vessels, adjacent structures as well as headache and CSF leak,
written and oral informed consent was obtained.   Consent was
obtained by Dr.KARAOGLAN.

Patient was positioned prone on the fluoroscopy table. Local
anesthesia was provided with 1% lidocaine without epinephrine after
prepped and draped in the usual sterile fashion. Puncture was
performed at L3-L4 using a 5 inches 22-gauge spinal needle via
right paramedian approach.  Using a single pass through the dura,
the needle was placed within the thecal sac, with return of clear
CSF. 15 mL of [9V] was injected into the thecal sac, with
normal opacification of the nerve roots and cauda equina consistent
with free flow within the subarachnoid space.

Fluoroscopy time: 4 minutes 49 seconds
FINDINGS: Cervical myelogram demonstrates C5-C6 anterior cervical
discectomy fusion plate.  Dilution of contrast and patient body
habitus produce suboptimal evaluation on plain film myelogram of
the cervical spine.  The thoracic spine demonstrates a small
anterior extradural impressions at T8-T9 and T9-T10.  The lumbar
spine shows a dextroconvex thoracolumbar scoliosis, probably
associated with remote L1 compression fracture.  Retropulsion of
the L1 vertebral body produces moderate central canal stenosis.
There is accelerated degenerative disease at L1-L2 with anterior
extradural impression.
IMPRESSION: 1.  Technically successful lumbar puncture for lumbar myelogram.
2.  C5-C6 anterior cervical discectomy fusion.
3.  Chronic L1 compression fracture with retropulsion.
4.  Degenerative disease of the thoracic and lumbar spine most
pronounced at T8-T9 and T9-T10.
5.  L1-L2 degenerative disc disease with anterior extradural
impression.

CT CERVICAL MYELOGRAM
FINDINGS: The alignment is nearly anatomic with 2 mm of
anterolisthesis of C4 on C5.  Anterior cervical discectomy fusion
plate is present at C5-C6.  There is no evidence of hardware
loosening or failure.  Small amount of bridging bone is present
consistent with completed fusion, best seen on coronal image number
11.  Cervical cord demonstrates a normal caliber.  Atlantodental
degenerative disease.  Craniocervical alignment is normal.

C2-C3:  Mild right uncovertebral spurring is present which has mass
effect on the ventral C3 nerve root.  Minimal central stenosis.
Right foraminal stenosis is mild.
C3-C4:  Mild to moderate left greater than right facet arthrosis
and uncovertebral spurring produce right foraminal stenosis without
significant left foraminal stenosis.  Central canal is adequately
patent.
C4-C5:  2 millimeters anterolisthesis of C4 on C5. Mild uncoverage
of the disc and a shallow broad-based posterior disc bulge produces
mild central canal stenosis.  The AP diameter of the central canal
is 1 cm.  Neural foramina appear patent.  There is mild flattening
of the cervical cord associated with the broad-based posterior disc
bulge.
C5-C6:  There is some posterior osseous ridging at C6, with this
exerts mass effect on the thecal sac and contacts the ventral
aspect of the cervical cord.  No foraminal stenosis is identified.
The hardware appears within normal limits.
C6-C7:  Degenerated disc with loss of height and anterior
osteophyte.  Mild right-sided uncovertebral spurring produces right
foraminal narrowing.  The facets appear within normal limits.
C7-T1:  Mild bilateral facet arthrosis, slightly worse on the
right.  No central stenosis.
IMPRESSION: 1.  C5-C6 anterior cervical discectomy fusion.  Posterior osseous
ridging is present inferior to C6 which produces mass effect on the
ventral aspect of the thecal sac and indents the cervical cord.
Foramina patent.
2.  2 mm of anterolisthesis of C4 on C5 with uncoverage of the
disc.  Mild central stenosis.
3.  Right C2-C3 uncovertebral spurring with mass effect on the
ventral C3 nerve root.
4.  Right C3-C4 foraminal stenosis associated with facet arthrosis
and uncovertebral spurring.

CT THORACIC MYELOGRAM
FINDINGS: There is a very mild S-shaped thoracic scoliosis.
Prominent Schmorl's node in the superior T6 endplate.  Large
Schmorl's node is also present in the superior T12 endplate.  Aside
from levels mentioned below, there is no significant stenosis.

T4-T5:  Minimal left-sided disc protrusion and posterior
longitudinal ligament thickening without significant stenosis.

T7-T8:  Shallow broad-based disc protrusion with minimal mass
effect on the thecal sac.
T8-T9:  Central and left paracentral disc protrusion with endplate
osteophytes produce mild to moderate central canal stenosis with
effacement of ventral CSF space.  This disc osteophyte complex
contacts the ventral aspect of the thoracic cord, indenting it
slightly.  The dorsal CSF space preserved.  AP diameter of the
spinal canal is 1 cm.
T9-T10:  Minimal disc bulging without stenosis.
IMPRESSION: Mild thoracic spine degenerative disease most pronounced at T8-T9
where there is a disc osteophyte complex that produces mild to
moderate central canal stenosis.  Disc osteophyte complex contacts
and indents the ventral aspect of the thoracic cord, displacing it
slightly posterior.

CT LUMBAR MYELOGRAM
FINDINGS: The paraspinal soft tissues appear within normal limits.
Dextroconvex lumbar scoliosis centered around L1-L2.  L1
compression fracture with 75% loss of anterior vertebral body
height and retropulsion of the posterior vertebral body.  Five
lumbar type vertebral bodies are identified.

T12-L1: There is moderate central stenosis associated with
retropulsion of the L1 vertebral body.  Mild bilateral facet
arthrosis. The neural foramina appear adequately patent.  Mild
bilateral lateral recess stenosis. Bone from the compression
fracture in the left extraforaminal region contacts the exiting
left a L1 nerve root.

L1-L2: Retropulsion of the L1 vertebral body and disc protrusion at
L1-L2 produce mild to moderate central canal stenosis.  There is a
broad-based posterior disc bulge, which has endplate osteophytes.
There is prominent osteophyte in the left paracentral region
impressing on the thecal sac and narrowing of the left lateral
recess. Mild left foraminal stenosis that is multifactorial.

L2-L3: Small amount of calcification in the disc.  Disc height is
preserved.  Facet hypertrophy and mild ligamentum flavum redundancy
indents the left posterior aspect of the thecal sac.  No neural
compression.  Foramina appear adequately patent bilaterally.

L3-L4: Relative preservation of disc height.  Small left foraminal
protrusion is present which may contact the exiting left L3 nerve
root in the lateral aspect of the foramen.  No definite neural
compression.  Right neural foramen appears adequately patent.
Lateral recesses patent. Moderate bilateral facet arthrosis, with
mild spurring.

L4-L5: Mild loss of disc height with shallow posterior bulging.  A
combination of facet arthrosis, scoliosis and disc bulging narrows
the right neural foramen.  Minimal bilateral lateral recess
narrowing without compression.  The left neural foramen is patent.
Severe bilateral facet arthrosis is present with vacuum joint on
the right.  Mild central canal stenosis predominately secondary to
ligamentum flavum redundancy and facet hypertrophy.

L5-S1: Severe right greater than left facet arthrosis.  There is a
small central disc protrusion that indents the ventral aspect of
the thecal sac in the right paracentral location.  Minimal mass
effect on the right lateral recess, potentially affecting the right
S1 nerve root.  Severe right facet spurring also encroaches on the
right lateral recess.  The left neural foramen is patent.  Despite
the degenerated facet, right foraminal stenosis is mild.
IMPRESSION: 1.  Chronic L1 compression fracture with retropulsion appears
unchanged compared to prior exam.  Moderate central canal stenosis
with retropulsed disc contacting and displacing the conus
medullaris.  Adjacent L1-L2 degenerative disc disease and
retropulsion, with left-sided central stenosis.
2.  Multilevel severe facet arthrosis in the lower lumbar spine.
3.  L5-S1 central disc protrusion with mild mass effect on the
thecal sac.  This protrusion and facet hypertrophy/spurring
encroach on the the right lateral recess, potentially affecting the
right S1 nerve root.

## 2009-05-26 ENCOUNTER — Encounter: Admission: RE | Admit: 2009-05-26 | Discharge: 2009-05-26 | Payer: Self-pay | Admitting: Neurosurgery

## 2009-06-23 ENCOUNTER — Encounter: Admission: RE | Admit: 2009-06-23 | Discharge: 2009-06-23 | Payer: Self-pay | Admitting: Neurosurgery

## 2009-12-12 ENCOUNTER — Encounter: Admission: RE | Admit: 2009-12-12 | Discharge: 2009-12-12 | Payer: Self-pay | Admitting: Internal Medicine

## 2010-11-23 ENCOUNTER — Other Ambulatory Visit: Payer: Self-pay | Admitting: Internal Medicine

## 2010-11-23 DIAGNOSIS — Z1231 Encounter for screening mammogram for malignant neoplasm of breast: Secondary | ICD-10-CM

## 2010-12-11 NOTE — H&P (Signed)
NAME:  Jody Turner, Jody Turner                             ACCOUNT NO.:  [id number]   MEDICAL RECORD NO.:  [id number]                   PATIENT TYPE:  INP   LOCATION:  3172                                 FACILITY:  [facility]   PHYSICIAN:  Hilda Lias, M.D.                DATE OF BIRTH:  09/13/1960   DATE OF ADMISSION:  03/06/2003  DATE OF DISCHARGE:                                HISTORY & PHYSICAL   HISTORY OF PRESENT ILLNESS:  Ms. Harston is a lady who was seen by me in the  past because of lower back pain. 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 She was seen by me ten days ago  because of the sudden onset of neck pain. She was seen at the request of her  medical doctor as an emergency. She said that for the past four months she  had been complaining of weakness in the arm, associated with neck pain going  to both arms, and the sensation of difficulty moving her hand. Because of  the MRI, she was sent to [place] as emergency.   PAST MEDICAL HISTORY:  1. Foot surgery.  2. Varicose veins.  3. Tonsillectomy.   ALLERGIES:  She is allergic to DEMEROL.   SOCIAL HISTORY:  Negative.   FAMILY HISTORY:  Positive for high blood pressure, chest pressure.   PHYSICAL EXAMINATION:  VITAL SIGNS:  Height 5' 7, weight 208 pounds.  GENERAL:  The patient came to my office walking with short steps.  HEENT:  Normal.  NECK:  She is able to flex, but extension laterally causes discomfort going  to the shoulder.  LUNGS:  Clear.  HEART:  Normal.  ABDOMEN:  Normal.  EXTREMITIES:  Normal pulses.  NEUROLOGICAL:  Mental status normal. Cranial nerves normal. Strength is  normal in both biceps and both wrist extensors. Reflexes symmetrical 1+ with  absence of the right biceps. She has radiation to the lumbar spine.   DIAGNOSTIC STUDIES:  The MRI showed markedly herniated disk at C5-6 with  calcification and displacement of the spinal cord.   CLINICAL IMPRESSION:  1.  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  2. Chronic back pain with degenerative disk disease at L4-5 and L5-S1 and     old fracture at L5-S1.    RECOMMENDATIONS:  The patient is going to be admitted for anterior cervical  diskectomy at C5-6. She knows all the risks such as infection, CSF leak,  worsening pain, need for further surgery, and no improvement whatsoever in  her pain.                                                Hilda Lias, M.D.  EB/MEDQ  D:  03/06/2003  T:  03/06/2003  Job:  [id number]

## 2010-12-11 NOTE — Op Note (Signed)
NAME:  Jody Turner, MACFARLANE                             ACCOUNT NO.:  [id number]   MEDICAL RECORD NO.:  [id number]                   PATIENT TYPE:  INP   LOCATION:  3011                                 FACILITY:  [facility]   PHYSICIAN:  Hilda Lias, M.D.                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   OPERATION PERFORMED:  Anterior 5-6 diskectomy, decompression of the spinal  cord, bilateral foraminotomy, interbody allograft fusion, plate A2-1,  microscope.   SURGEON:  Hilda Lias, M.D.   ASSISTANT:  Hewitt Shorts, M.D.   ANESTHESIA:  General.   INDICATIONS FOR PROCEDURE:  The patient is a 50 year old female, 5 feet 7  inches, over 280 pounds complaining of neck pain with radiation to both  upper extremities.  MRI showed that she had a large herniated disk  at the  level of 5-6 with displacement of spinal cord.  Surgery was advised.  The  risks were explained in the history and physical.   DESCRIPTION OF PROCEDURE:  The patient was taken to the operating room  __________  position.  No traction was used.  No pillow under the shoulder.  Nevertheless the patient has a short neck.  The neck was prepped with  Betadine and transverse incision was made.  The incision was carried down to  the platysma, down to cervical spine.  We put three needles in three  different disks and the highest one was the level 4-5.  From then on, we  identified 5-6.  We brought the microscope into the area.  She had anterior  osteophytes which were removed.  We opened the anterior ligament and we did  total diskectomy.  Indeed, the patient has a large herniated disk with  __________  in the midline.  We opened the posterior  ligament with good  decompression of the spinal cord and bilateral foraminotomy was done.  At  the end we had good decompression.  The endplate was trimmed and a piece of  allograft 7 mm high was inserted, followed by a plate which was bifixed.  Investigation of the esophagus, trachea and carotids was normal.  Indeed  because of the size of the patient, we decided not to do any more x-ray, but  we knew there was good space between posterior aspect of the graft and  spinal cord.  From then on the area was irrigated and dressed with Vicryl  and Steri-Strips.  Hilda Lias, M.D.    EB/MEDQ  D:  03/06/2003  T:  03/06/2003  Job:  [id number]

## 2010-12-15 ENCOUNTER — Ambulatory Visit
Admission: RE | Admit: 2010-12-15 | Discharge: 2010-12-15 | Disposition: A | Discharge status: Home / Self Care | Payer: Medicare Other | Source: Ambulatory Visit | Attending: Internal Medicine | Admitting: Internal Medicine

## 2010-12-15 DIAGNOSIS — Z1231 Encounter for screening mammogram for malignant neoplasm of breast: Secondary | ICD-10-CM

## 2011-07-16 ENCOUNTER — Other Ambulatory Visit: Payer: Self-pay | Admitting: Neurosurgery

## 2011-07-16 DIAGNOSIS — M549 Dorsalgia, unspecified: Secondary | ICD-10-CM

## 2011-07-22 ENCOUNTER — Ambulatory Visit
Admission: RE | Admit: 2011-07-22 | Discharge: 2011-07-22 | Disposition: A | Discharge status: Home / Self Care | Payer: Medicare Other | Source: Ambulatory Visit | Attending: Neurosurgery | Admitting: Neurosurgery

## 2011-07-22 DIAGNOSIS — M549 Dorsalgia, unspecified: Secondary | ICD-10-CM
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
## 2011-07-22 MED ORDER — MEPERIDINE HCL 50 MG/ML IJ SOLN: 50.0000 mg | Freq: Once | INTRAMUSCULAR | Status: DC

## 2011-07-22 MED ORDER — IOHEXOL 300 MG/ML  SOLN
10.0000 mL | Freq: Once | INTRAMUSCULAR | Status: AC | PRN
  Administered 2011-07-22: 10 mL via INTRATHECAL

## 2011-07-22 MED ORDER — ONDANSETRON HCL 4 MG/2ML IJ SOLN: 4.0000 mg | Freq: Once | INTRAMUSCULAR | Status: DC

## 2011-07-22 MED ORDER — DIAZEPAM 5 MG PO TABS: 10.0000 mg | ORAL_TABLET | Freq: Once | ORAL | Status: DC

## 2011-07-22 NOTE — Progress Notes (Signed)
8413  Informed Consent obtained.  Review verbal & written discharge instructions w/ patient, she understands.  1015 Patient c/o back discomfort & nausea.  Given Rx per Dr Benard Rink.  1040  Resting more comfortably at present.  States that pain is easing.  Does state that she has a mild h/a.  1105  Dr Benard Rink here to see patient.    1110  Reviewed discharge instructions w/ patient & her daughter.  Ambulates alone, gait steady.  1115  Discharged to home.  Daughter to drive.

## 2011-08-13 ENCOUNTER — Other Ambulatory Visit: Payer: Self-pay | Admitting: Neurosurgery

## 2011-08-24 ENCOUNTER — Encounter (HOSPITAL_COMMUNITY): Payer: Self-pay | Admitting: Pharmacy Technician

## 2011-08-27 ENCOUNTER — Encounter (HOSPITAL_COMMUNITY)
Admission: RE | Admit: 2011-08-27 | Discharge: 2011-08-27 | Disposition: A | Discharge status: Home / Self Care | Payer: Medicare Other | Source: Ambulatory Visit | Attending: Neurosurgery | Admitting: Neurosurgery

## 2011-08-27 ENCOUNTER — Other Ambulatory Visit: Payer: Self-pay

## 2011-08-27 ENCOUNTER — Encounter (HOSPITAL_COMMUNITY): Payer: Self-pay

## 2011-08-27 ENCOUNTER — Encounter (HOSPITAL_COMMUNITY)
Admission: RE | Admit: 2011-08-27 | Discharge: 2011-08-27 | Disposition: A | Discharge status: Home / Self Care | Payer: Medicare Other | Source: Ambulatory Visit | Attending: Anesthesiology | Admitting: Anesthesiology

## 2011-08-27 DIAGNOSIS — Z01818 Encounter for other preprocedural examination: Secondary | ICD-10-CM | POA: Insufficient documentation

## 2011-08-27 HISTORY — DX: Fibromyalgia: M79.7

## 2011-08-27 HISTORY — DX: Malignant (primary) neoplasm, unspecified: C80.1

## 2011-08-27 HISTORY — DX: Essential (primary) hypertension: I10

## 2011-08-27 HISTORY — DX: Unspecified osteoarthritis, unspecified site: M19.90

## 2011-08-27 HISTORY — DX: Major depressive disorder, single episode, unspecified: F32.9

## 2011-08-27 HISTORY — DX: Depression, unspecified: F32.A

## 2011-08-27 LAB — SURGICAL PCR SCREEN
MRSA, PCR: NEGATIVE
Staphylococcus aureus: NEGATIVE

## 2011-08-27 IMAGING — CR DG CHEST 2V
2 series · 2 of 2 positions shown · non-contrast
Comparison: CT thoracic myelogram [DATE].

CLINICAL DATA: 50-year-old female preoperative study for lumbar
surgery.

CHEST - 2 VIEW

[view not recorded (1 of 2)]
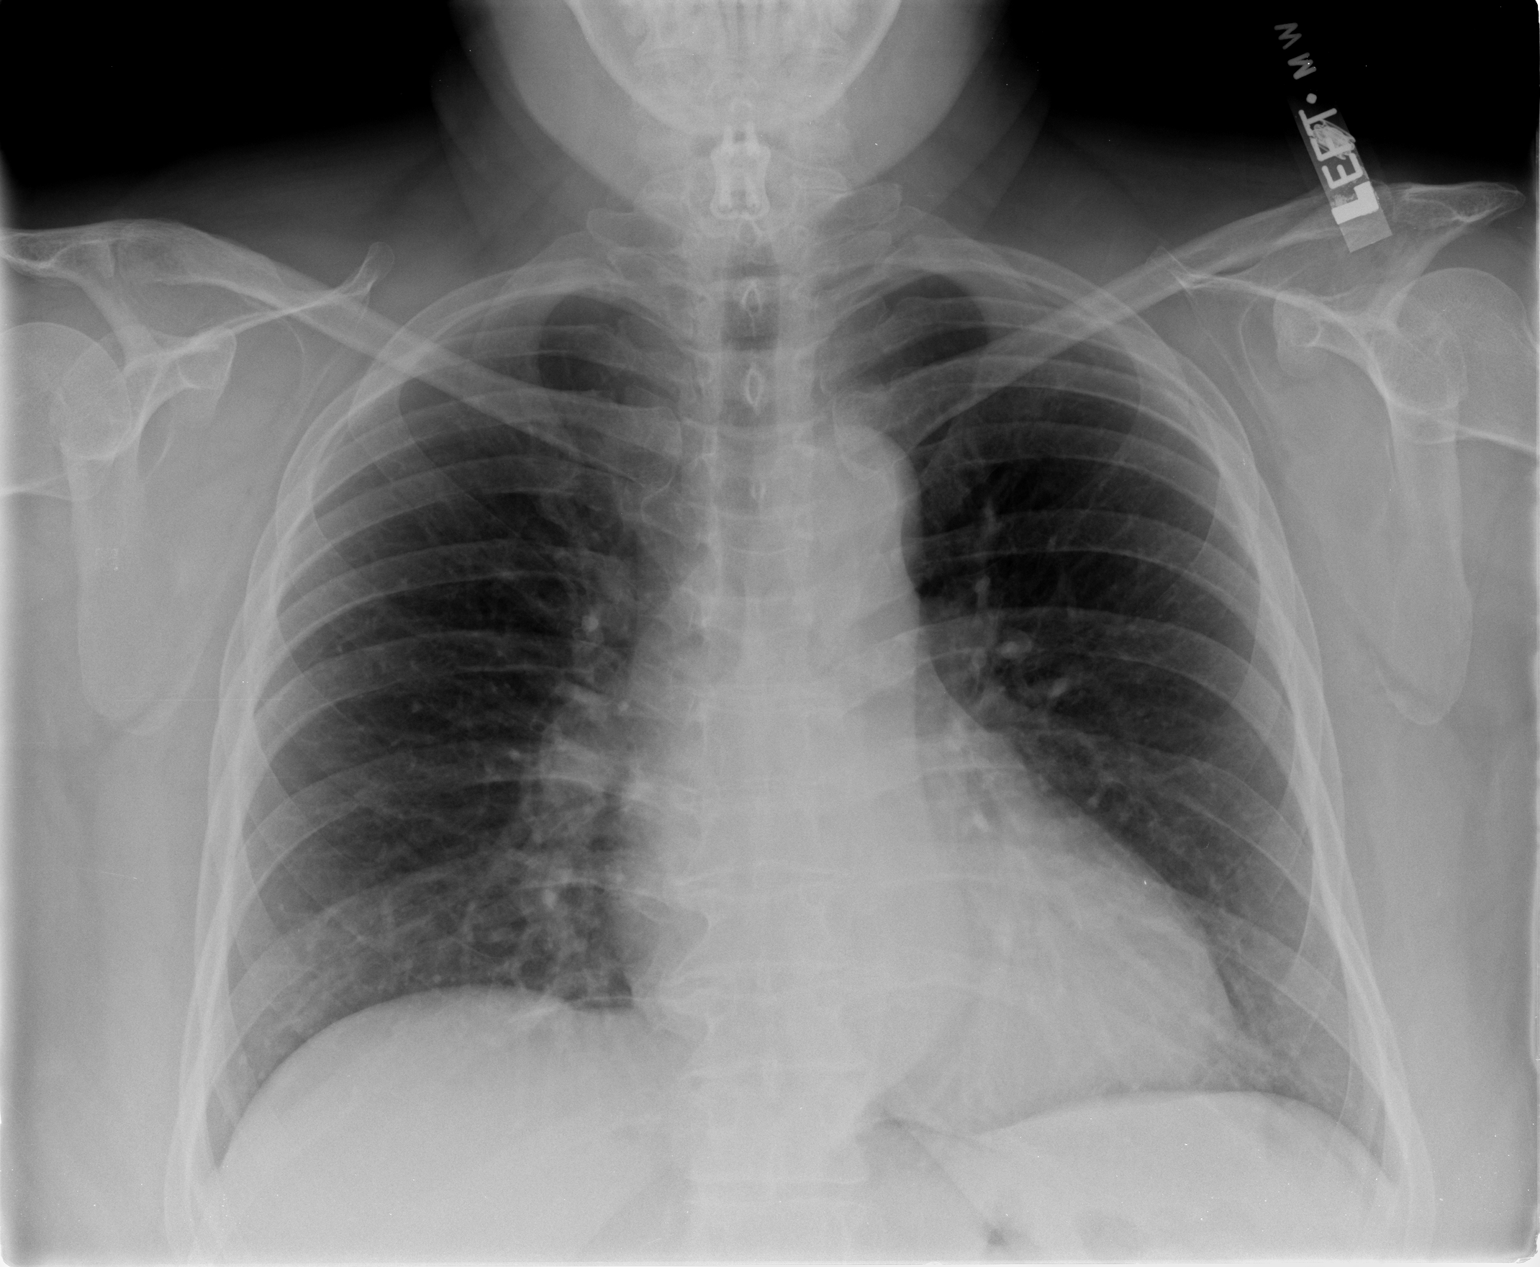

[view not recorded (2 of 2)]
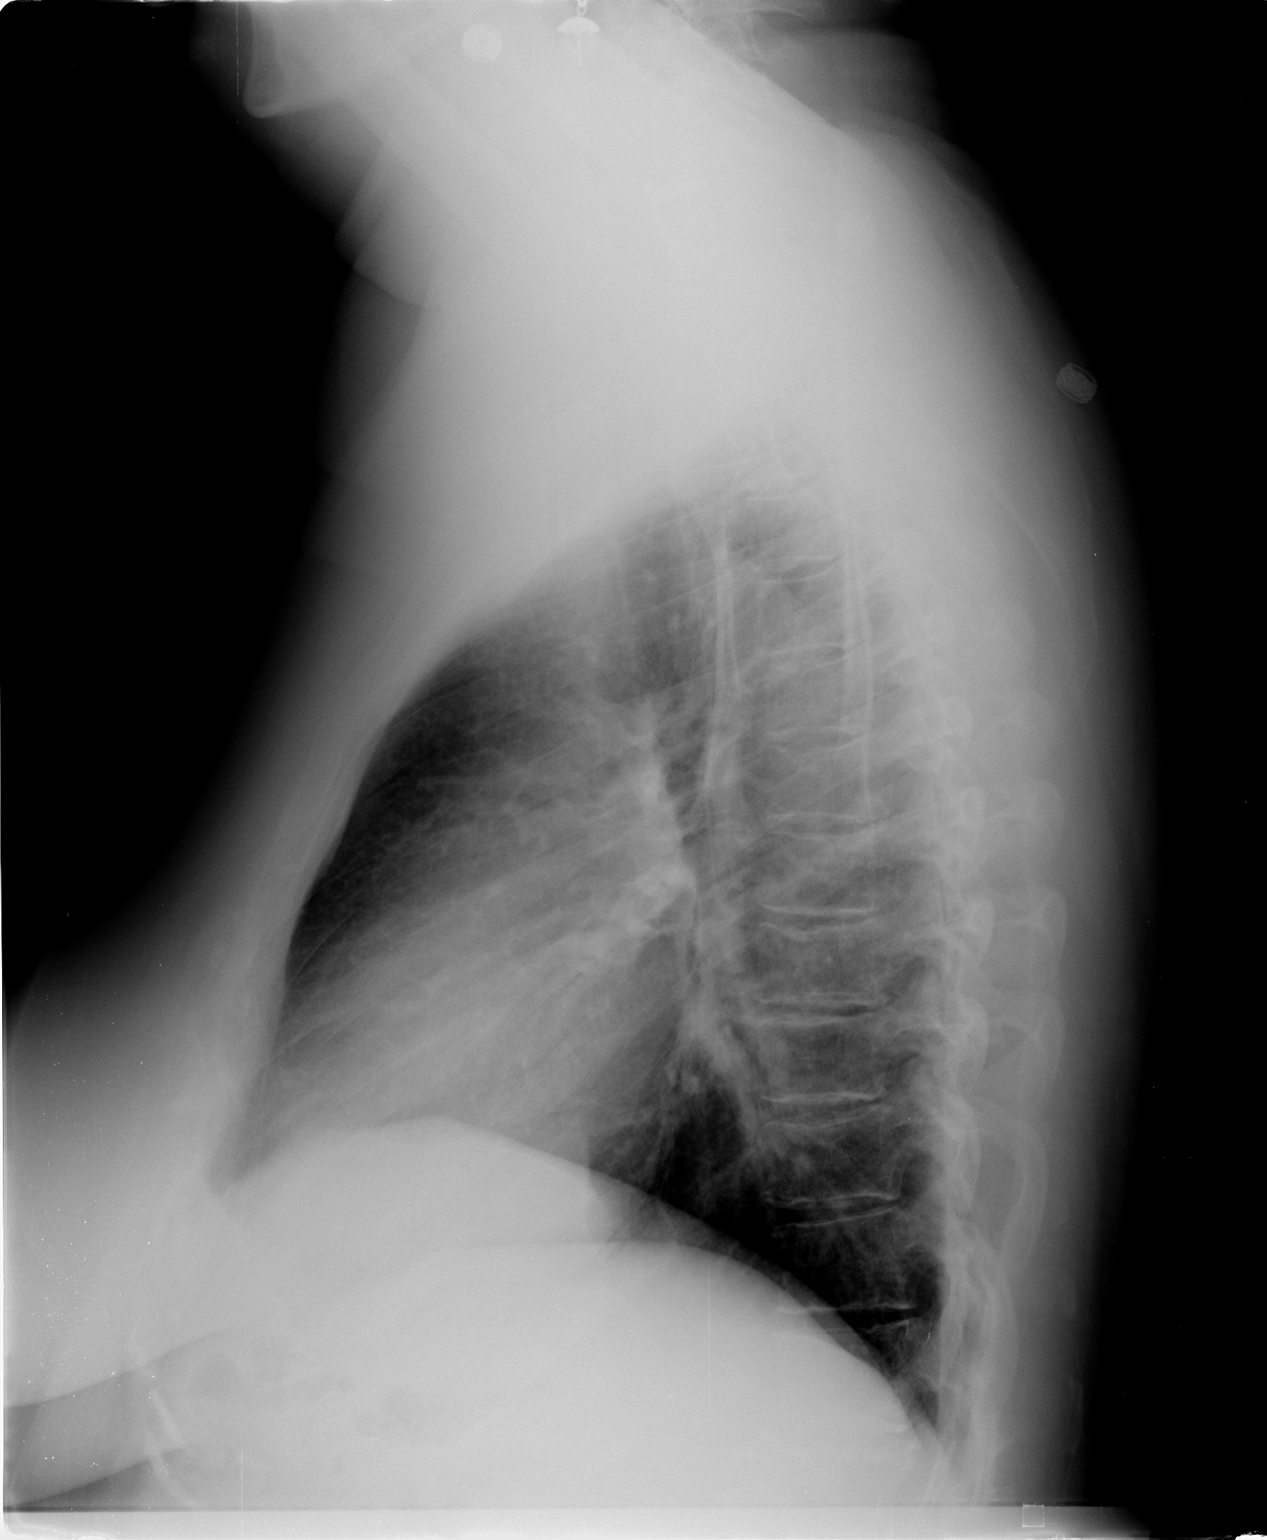

[2 of 2 positions shown; findings below may reference images not displayed]

FINDINGS: Lung volumes are within normal limits.  Cardiac size at
the upper limits of normal. Other mediastinal contours are within
normal limits.  Visualized tracheal air column is within normal
limits.  The lungs are clear.  Cervical ACDF hardware partially
visible.  Chronic L1 compression fracture.
IMPRESSION: No acute cardiopulmonary abnormality.

## 2011-08-27 NOTE — Pre-Procedure Instructions (Signed)
20 Jody Turner  08/27/2011   Your procedure is scheduled on:  09/17/2011  Report to [facility] at 5:30 AM.  Call this number if you have problems the morning of surgery: [phone]   Remember:   Do not eat food:After Midnight.  May have clear liquids: up to 4 Hours before arrival.  Clear liquids include soda, tea, black coffee, apple or grape juice, broth.  Take these medicines the morning of surgery with A SIP OF WATER: NONE   Do not wear jewelry, make-up or nail polish.  Do not wear lotions, powders, or perfumes. You may wear deodorant.  Do not shave 48 hours prior to surgery.  Do not bring valuables to the hospital.  Contacts, dentures or bridgework may not be worn into surgery.  Leave suitcase in the car. After surgery it may be brought to your room.  For patients admitted to the hospital, checkout time is 11:00 AM the day of discharge.   Patients discharged the day of surgery will not be allowed to drive home.  Name and phone number of your driver:   With spouse  Special Instructions: CHG Shower Use Special Wash: 1/2 bottle night before surgery and 1/2 bottle morning of surgery.   Please read over the following fact sheets that you were given: Pain Booklet, Coughing and Deep Breathing, MRSA Information and Surgical Site Infection Prevention

## 2011-09-16 MED ORDER — CEFAZOLIN SODIUM-DEXTROSE 2-3 GM-% IV SOLR
2.0000 g | INTRAVENOUS | Status: AC
  Administered 2011-09-17: 2 g via INTRAVENOUS
  Filled 2011-09-16: qty 50

## 2011-09-17 ENCOUNTER — Encounter (HOSPITAL_COMMUNITY)
Admission: RE | Disposition: A | Discharge status: Home / Self Care | Payer: Self-pay | Source: Ambulatory Visit | Attending: Neurosurgery

## 2011-09-17 ENCOUNTER — Encounter (HOSPITAL_COMMUNITY): Payer: Self-pay | Admitting: Anesthesiology

## 2011-09-17 ENCOUNTER — Inpatient Hospital Stay (HOSPITAL_COMMUNITY): Payer: Medicare Other

## 2011-09-17 ENCOUNTER — Inpatient Hospital Stay (HOSPITAL_COMMUNITY)
Admission: RE | Admit: 2011-09-17 | Discharge: 2011-09-18 | DRG: 491 | Disposition: A | Discharge status: Home / Self Care | Payer: Medicare Other | Source: Ambulatory Visit | Attending: Neurosurgery | Admitting: Neurosurgery

## 2011-09-17 ENCOUNTER — Encounter (HOSPITAL_COMMUNITY): Payer: Self-pay | Admitting: *Deleted

## 2011-09-17 ENCOUNTER — Inpatient Hospital Stay (HOSPITAL_COMMUNITY): Payer: Medicare Other | Admitting: Anesthesiology

## 2011-09-17 DIAGNOSIS — M5126 Other intervertebral disc displacement, lumbar region: Secondary | ICD-10-CM | POA: Diagnosis present

## 2011-09-17 DIAGNOSIS — I129 Hypertensive chronic kidney disease with stage 1 through stage 4 chronic kidney disease, or unspecified chronic kidney disease: Secondary | ICD-10-CM | POA: Diagnosis present

## 2011-09-17 DIAGNOSIS — Z79899 Other long term (current) drug therapy: Secondary | ICD-10-CM

## 2011-09-17 DIAGNOSIS — N189 Chronic kidney disease, unspecified: Secondary | ICD-10-CM | POA: Diagnosis present

## 2011-09-17 DIAGNOSIS — E669 Obesity, unspecified: Secondary | ICD-10-CM | POA: Diagnosis present

## 2011-09-17 DIAGNOSIS — Z981 Arthrodesis status: Secondary | ICD-10-CM

## 2011-09-17 DIAGNOSIS — IMO0001 Reserved for inherently not codable concepts without codable children: Secondary | ICD-10-CM | POA: Diagnosis present

## 2011-09-17 DIAGNOSIS — Z85828 Personal history of other malignant neoplasm of skin: Secondary | ICD-10-CM

## 2011-09-17 DIAGNOSIS — Z882 Allergy status to sulfonamides status: Secondary | ICD-10-CM

## 2011-09-17 DIAGNOSIS — Z9889 Other specified postprocedural states: Secondary | ICD-10-CM

## 2011-09-17 DIAGNOSIS — F3289 Other specified depressive episodes: Secondary | ICD-10-CM | POA: Diagnosis present

## 2011-09-17 DIAGNOSIS — F329 Major depressive disorder, single episode, unspecified: Secondary | ICD-10-CM | POA: Diagnosis present

## 2011-09-17 DIAGNOSIS — Z885 Allergy status to narcotic agent status: Secondary | ICD-10-CM

## 2011-09-17 DIAGNOSIS — Z888 Allergy status to other drugs, medicaments and biological substances status: Secondary | ICD-10-CM

## 2011-09-17 DIAGNOSIS — M47817 Spondylosis without myelopathy or radiculopathy, lumbosacral region: Principal | ICD-10-CM | POA: Diagnosis present

## 2011-09-17 DIAGNOSIS — M1990 Unspecified osteoarthritis, unspecified site: Secondary | ICD-10-CM | POA: Diagnosis present

## 2011-09-17 HISTORY — PX: LUMBAR LAMINECTOMY/DECOMPRESSION MICRODISCECTOMY: SHX5026

## 2011-09-17 LAB — BASIC METABOLIC PANEL
BUN: 16 mg/dL (ref 6–23)
Calcium: 9.3 mg/dL (ref 8.4–10.5)
Chloride: 105 mEq/L (ref 96–112)
Creatinine, Ser: 0.72 mg/dL (ref 0.50–1.10)
GFR calc Af Amer: >90 mL/min (ref >90)

## 2011-09-17 LAB — CBC
HCT: 35.9 % — ABNORMAL LOW (ref 36.0–46.0)
MCHC: 34.5 g/dL (ref 30.0–36.0)
Platelets: 230 10*3/uL (ref 150–400)
RDW: 14.6 % (ref 11.5–15.5)
WBC: 4.4 10*3/uL (ref 4.0–10.5)

## 2011-09-17 IMAGING — CR DG LUMBAR SPINE 1V
1 series · 1 of 1 positions shown · non-contrast
Comparison: [DATE] post myelogram CT.

CLINICAL DATA: Localization for lumbar surgery.

LUMBAR SPINE - 1 VIEW

[view not recorded]
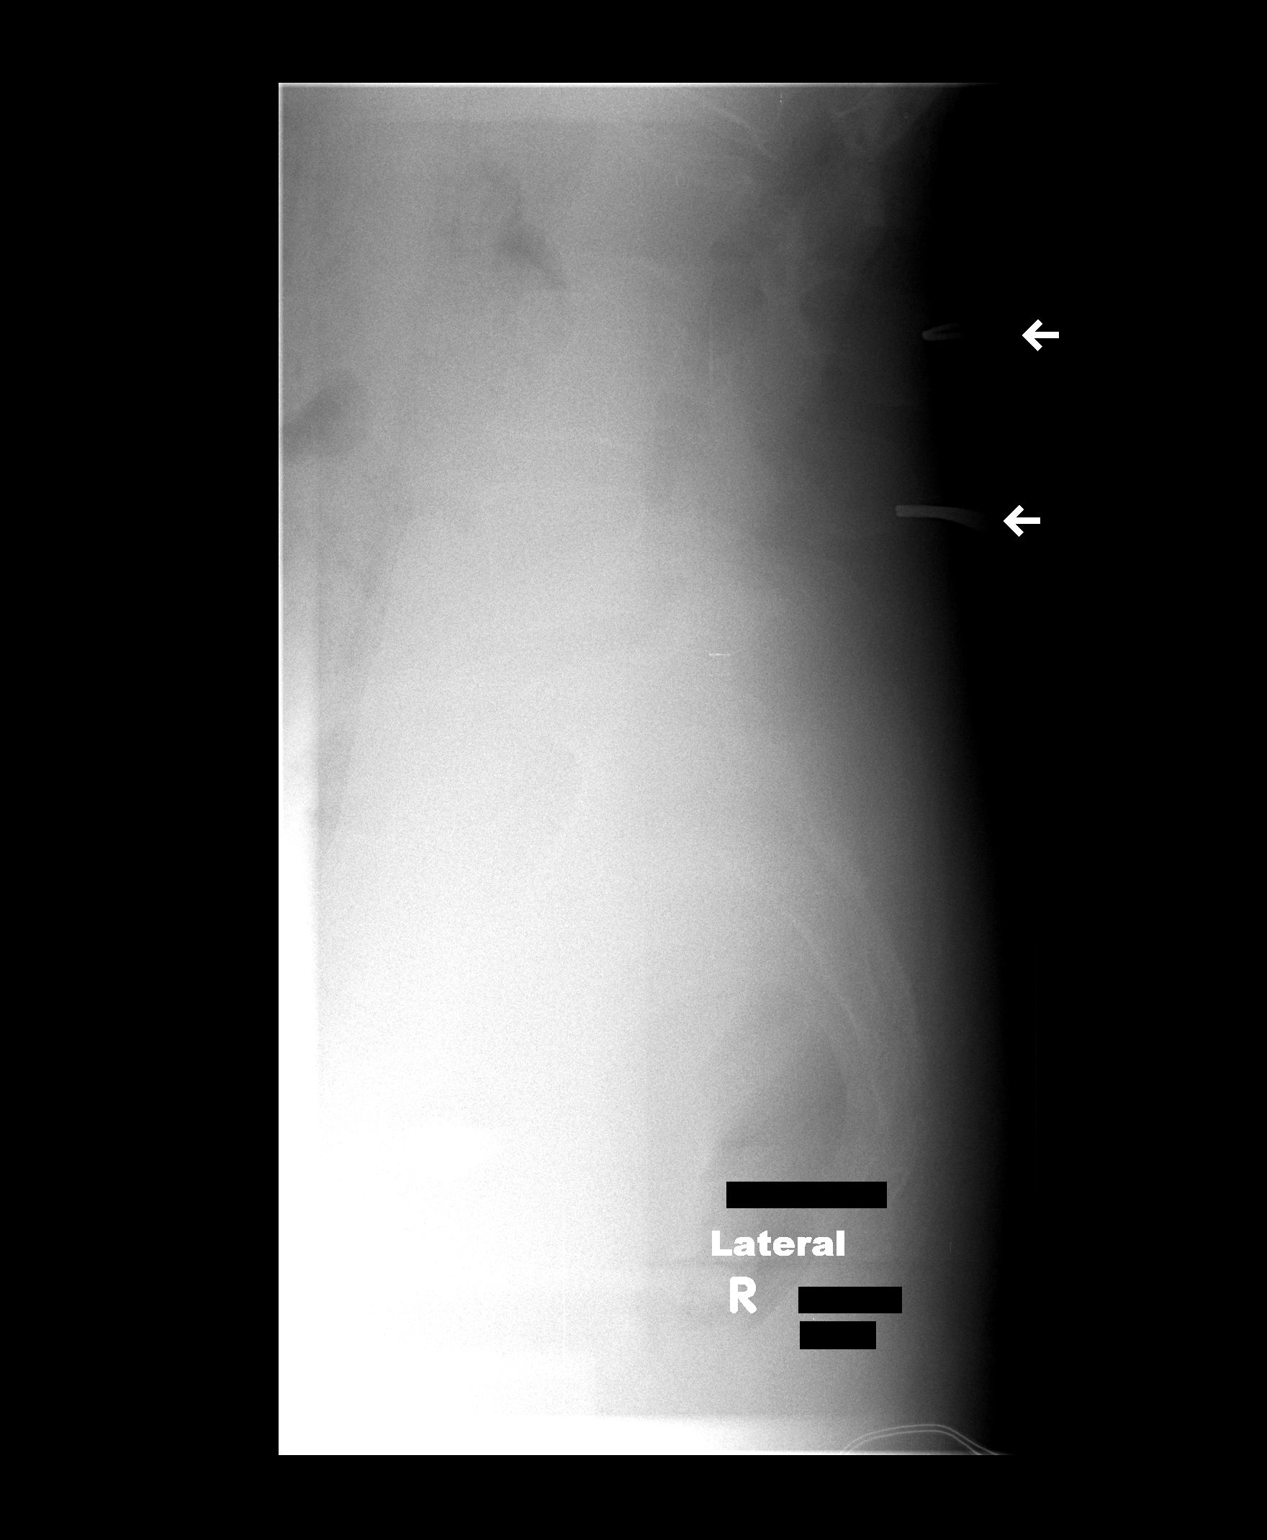

[1 of 1 positions shown; findings below may reference images not displayed]

FINDINGS: Exam limited by patient's habitus.  Lateral view
intraoperative film reveals two metallic probes in place.  The
superior metallic probe appears at the L3 spinous process level and
the inferior metallic probe at the L4 spinous process.

Results discussed by phone with Dr. SONIAH.  On follow-up
examinations, increased penetration may prove helpful.
IMPRESSION: Localization L3 and L4 spinous process.  Please see above.

## 2011-09-17 IMAGING — CR DG LUMBAR SPINE 1V
1 series · 1 of 1 positions shown · non-contrast
Comparison: Studies from same date and [DATE].

CLINICAL DATA: Lumbar microdiskectomy.

LUMBAR SPINE - 1 VIEW

[view not recorded]
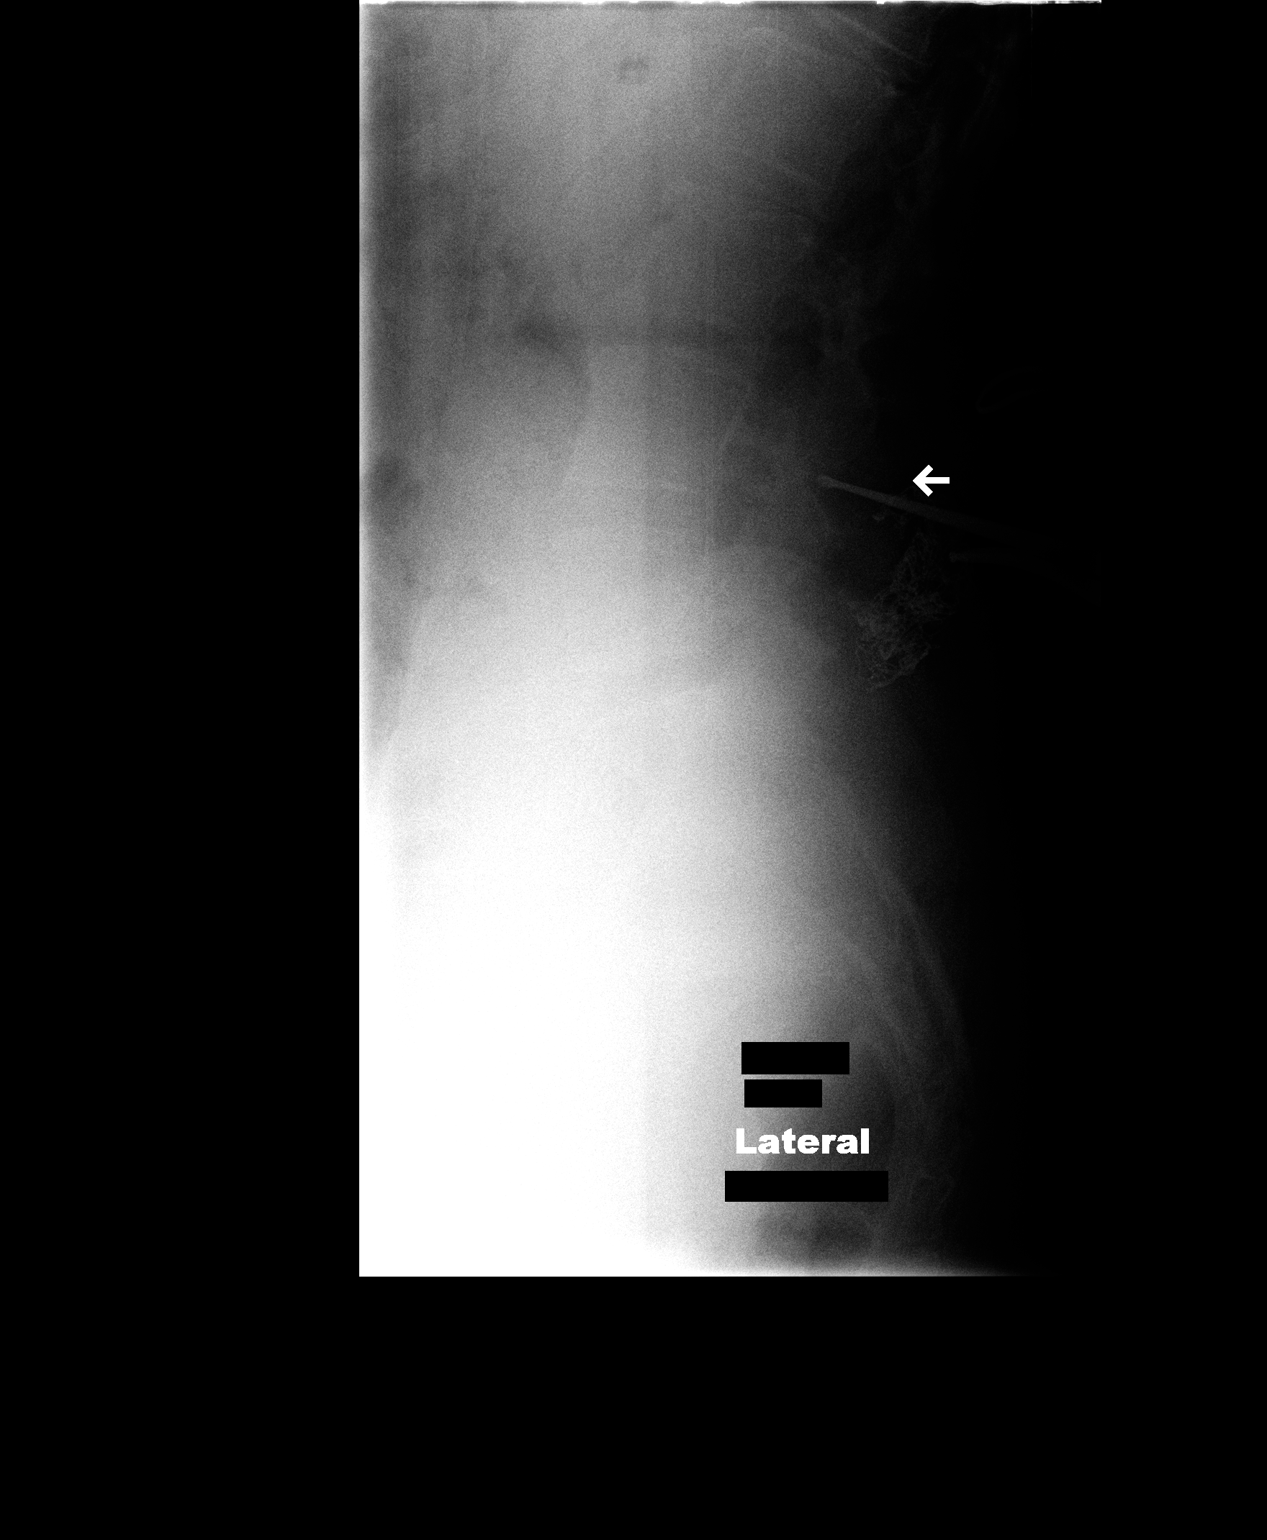

[1 of 1 positions shown; findings below may reference images not displayed]

FINDINGS: Single lateral view labeled [NI] hours.  Surgical device
projects posterior to the L4 vertebral body.  Underpenetration.
Degraded by patient body habitus.
IMPRESSION: Intraoperative localization of L4.

## 2011-09-17 IMAGING — CR DG SPINE 1V PORT
1 series · 1 of 1 positions shown · non-contrast
Comparison: Earlier today at [XI] hours.

CLINICAL DATA: Microdiskectomy.

PORTABLE SPINE - 1 VIEW

[view not recorded]
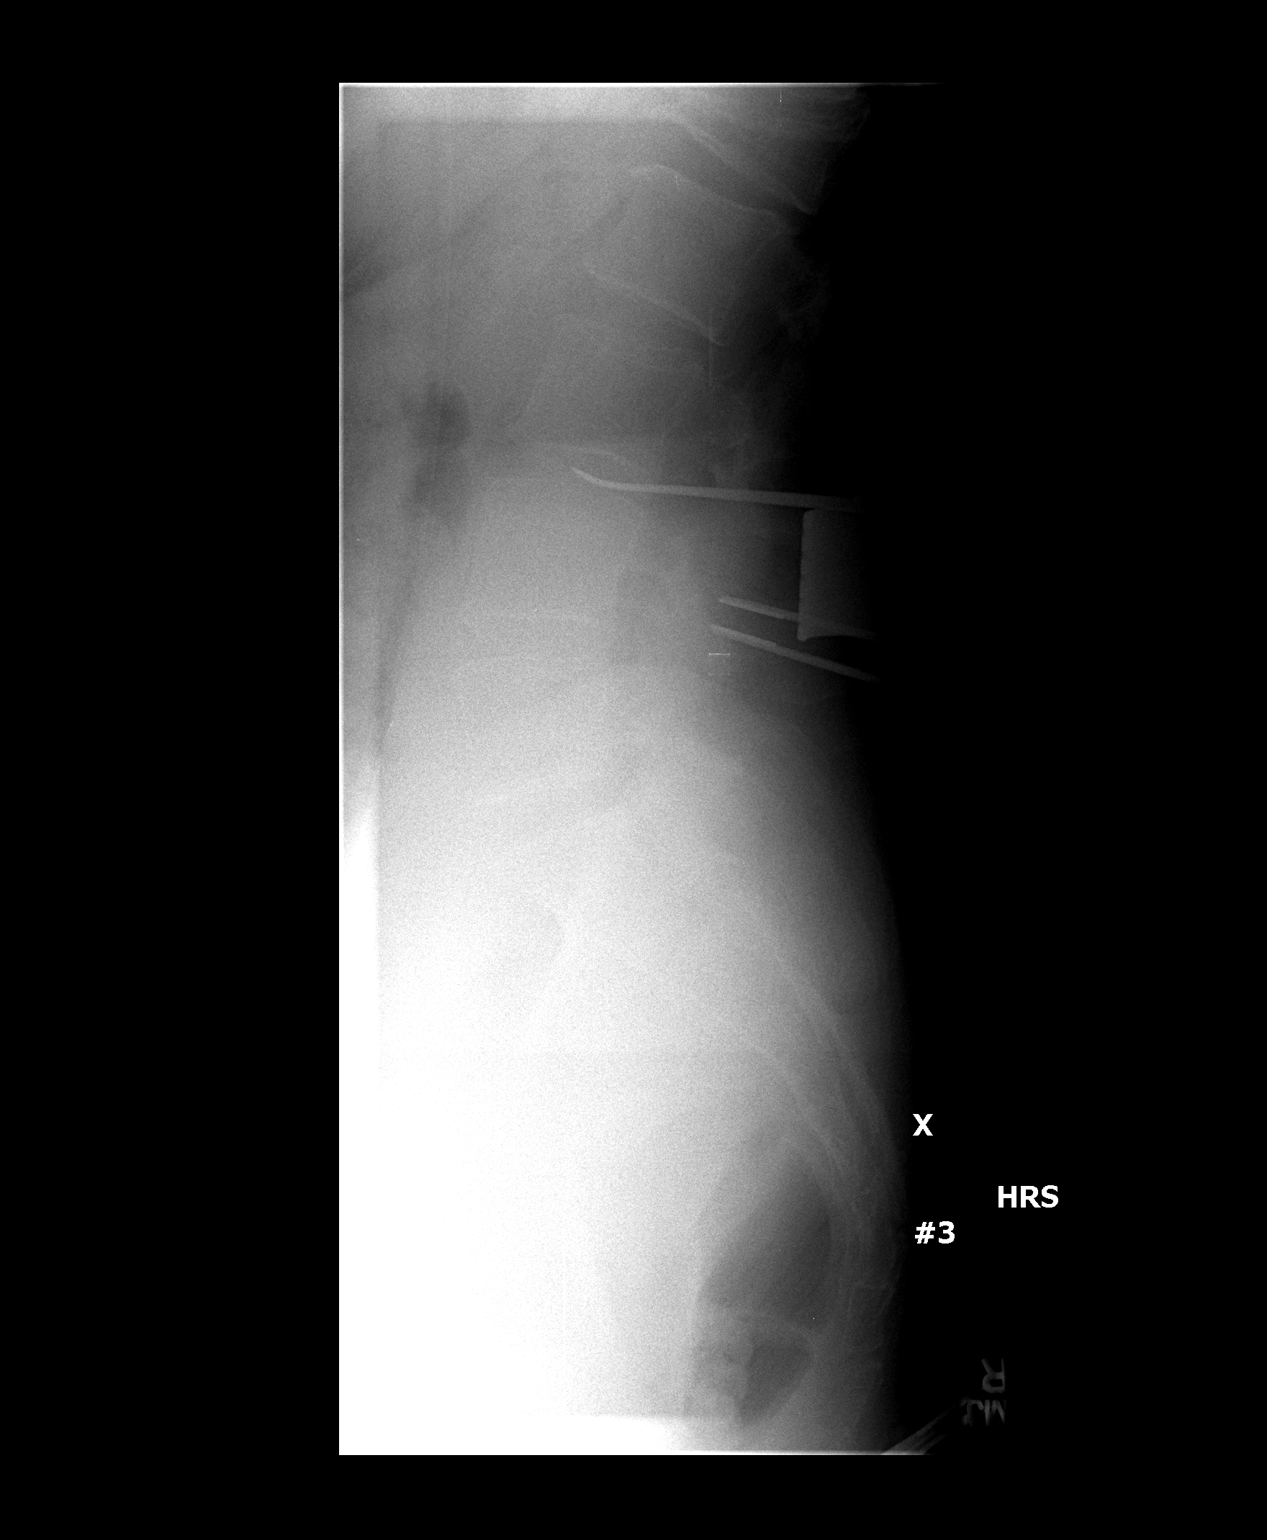

[1 of 1 positions shown; findings below may reference images not displayed]

FINDINGS: Single lateral view labeled [XI] hours.  Serve device
projects over the L3-L4 interspace.  Surgical devices also project
posterior to the inferior aspect of the L4 vertebral body.
IMPRESSION: Intraoperative localization of L3-L4.

## 2011-09-17 SURGERY — LUMBAR LAMINECTOMY/DECOMPRESSION MICRODISCECTOMY
Anesthesia: General | Site: Spine Lumbar | Wound class: Clean

## 2011-09-17 MED ORDER — DIAZEPAM 5 MG PO TABS
5.0000 mg | ORAL_TABLET | Freq: Four times a day (QID) | ORAL | Status: DC | PRN
  Administered 2011-09-17 – 2011-09-18 (×2): 5 mg via ORAL
  Filled 2011-09-17 (×2): qty 1

## 2011-09-17 MED ORDER — LACTATED RINGERS IV SOLN
INTRAVENOUS | Status: DC | PRN
  Administered 2011-09-17 (×3): via INTRAVENOUS

## 2011-09-17 MED ORDER — ONDANSETRON HCL 4 MG/2ML IJ SOLN
INTRAMUSCULAR | Status: DC | PRN
  Administered 2011-09-17 (×2): 4 mg via INTRAVENOUS

## 2011-09-17 MED ORDER — ACETAMINOPHEN 325 MG PO TABS
650.0000 mg | ORAL_TABLET | ORAL | Status: DC | PRN
  Administered 2011-09-17: 650 mg via ORAL
  Filled 2011-09-17: qty 2

## 2011-09-17 MED ORDER — DOCUSATE SODIUM 100 MG PO CAPS
100.0000 mg | ORAL_CAPSULE | Freq: Two times a day (BID) | ORAL | Status: DC
  Administered 2011-09-17: 100 mg via ORAL
  Filled 2011-09-17 (×2): qty 1

## 2011-09-17 MED ORDER — BUPIVACAINE LIPOSOME 1.3 % IJ SUSP
20.0000 mL | Freq: Once | INTRAMUSCULAR | Status: DC
  Filled 2011-09-17: qty 20

## 2011-09-17 MED ORDER — ZOLPIDEM TARTRATE 10 MG PO TABS: 10.0000 mg | ORAL_TABLET | Freq: Every evening | ORAL | Status: DC | PRN

## 2011-09-17 MED ORDER — BUPIVACAINE LIPOSOME 1.3 % IJ SUSP
INTRAMUSCULAR | Status: DC | PRN
  Administered 2011-09-17: 20 mL

## 2011-09-17 MED ORDER — SODIUM CHLORIDE 0.9 % IJ SOLN
3.0000 mL | Freq: Two times a day (BID) | INTRAMUSCULAR | Status: DC
  Administered 2011-09-17: 3 mL via INTRAVENOUS

## 2011-09-17 MED ORDER — ROCURONIUM BROMIDE 100 MG/10ML IV SOLN
INTRAVENOUS | Status: DC | PRN
  Administered 2011-09-17: 50 mg via INTRAVENOUS

## 2011-09-17 MED ORDER — HYDROMORPHONE HCL PF 1 MG/ML IJ SOLN
0.5000 mg | INTRAMUSCULAR | Status: DC | PRN
Start: 2011-09-17 — End: 2011-09-17
  Administered 2011-09-17: 0.5 mg via INTRAVENOUS

## 2011-09-17 MED ORDER — SODIUM CHLORIDE 0.9 % IV SOLN: INTRAVENOUS | Status: DC

## 2011-09-17 MED ORDER — ACETAMINOPHEN 650 MG RE SUPP: 650.0000 mg | RECTAL | Status: DC | PRN

## 2011-09-17 MED ORDER — CITALOPRAM HYDROBROMIDE 20 MG PO TABS
20.0000 mg | ORAL_TABLET | Freq: Every day | ORAL | Status: DC
  Administered 2011-09-17: 20 mg via ORAL
  Filled 2011-09-17 (×2): qty 1

## 2011-09-17 MED ORDER — 0.9 % SODIUM CHLORIDE (POUR BTL) OPTIME
TOPICAL | Status: DC | PRN
  Administered 2011-09-17: 1000 mL

## 2011-09-17 MED ORDER — MENTHOL 3 MG MT LOZG: 1.0000 | LOZENGE | OROMUCOSAL | Status: DC | PRN

## 2011-09-17 MED ORDER — SODIUM CHLORIDE 0.9 % IV SOLN: 250.0000 mL | INTRAVENOUS | Status: DC

## 2011-09-17 MED ORDER — ONDANSETRON HCL 4 MG/2ML IJ SOLN: 4.0000 mg | Freq: Once | INTRAMUSCULAR | Status: DC | PRN

## 2011-09-17 MED ORDER — MIDAZOLAM HCL 5 MG/5ML IJ SOLN
INTRAMUSCULAR | Status: DC | PRN
  Administered 2011-09-17: 2 mg via INTRAVENOUS

## 2011-09-17 MED ORDER — VITAMIN D (ERGOCALCIFEROL) 1.25 MG (50000 UNIT) PO CAPS
50000.0000 [IU] | ORAL_CAPSULE | ORAL | Status: DC
  Administered 2011-09-17: 50000 [IU] via ORAL
  Filled 2011-09-17: qty 1

## 2011-09-17 MED ORDER — GLYCOPYRROLATE 0.2 MG/ML IJ SOLN
INTRAMUSCULAR | Status: DC | PRN
  Administered 2011-09-17: .4 mg via INTRAVENOUS

## 2011-09-17 MED ORDER — HYDROMORPHONE HCL PF 1 MG/ML IJ SOLN
0.2500 mg | INTRAMUSCULAR | Status: DC | PRN
  Administered 2011-09-17 (×5): 0.5 mg via INTRAVENOUS

## 2011-09-17 MED ORDER — DEXAMETHASONE SODIUM PHOSPHATE 10 MG/ML IJ SOLN
INTRAMUSCULAR | Status: DC | PRN
  Administered 2011-09-17: 8 mg via INTRAVENOUS

## 2011-09-17 MED ORDER — SODIUM CHLORIDE 0.9 % IJ SOLN: 3.0000 mL | INTRAMUSCULAR | Status: DC | PRN

## 2011-09-17 MED ORDER — LISINOPRIL 20 MG PO TABS
20.0000 mg | ORAL_TABLET | Freq: Every day | ORAL | Status: DC
Start: 2011-09-18 — End: 2011-09-18
  Filled 2011-09-17: qty 1

## 2011-09-17 MED ORDER — HYDROMORPHONE HCL PF 1 MG/ML IJ SOLN
INTRAMUSCULAR | Status: AC
  Administered 2011-09-17: 0.5 mg via INTRAVENOUS
  Filled 2011-09-17: qty 1

## 2011-09-17 MED ORDER — EPHEDRINE SULFATE 50 MG/ML IJ SOLN
INTRAMUSCULAR | Status: DC | PRN
  Administered 2011-09-17 (×2): 5 mg via INTRAVENOUS

## 2011-09-17 MED ORDER — FENTANYL CITRATE 0.05 MG/ML IJ SOLN
INTRAMUSCULAR | Status: DC | PRN
  Administered 2011-09-17: 50 ug via INTRAVENOUS
  Administered 2011-09-17: 100 ug via INTRAVENOUS
  Administered 2011-09-17 (×3): 50 ug via INTRAVENOUS

## 2011-09-17 MED ORDER — HEMOSTATIC AGENTS (NO CHARGE) OPTIME
TOPICAL | Status: DC | PRN
  Administered 2011-09-17: 1 via TOPICAL

## 2011-09-17 MED ORDER — PROPOFOL 10 MG/ML IV EMUL
INTRAVENOUS | Status: DC | PRN
  Administered 2011-09-17: 300 mg via INTRAVENOUS

## 2011-09-17 MED ORDER — HYDROMORPHONE HCL PF 1 MG/ML IJ SOLN
INTRAMUSCULAR | Status: AC
  Filled 2011-09-17: qty 1

## 2011-09-17 MED ORDER — DIAZEPAM 5 MG PO TABS: 5.0000 mg | ORAL_TABLET | Freq: Four times a day (QID) | ORAL | Status: DC | PRN

## 2011-09-17 MED ORDER — MORPHINE SULFATE 2 MG/ML IJ SOLN: 0.0500 mg/kg | INTRAMUSCULAR | Status: DC | PRN

## 2011-09-17 MED ORDER — OXYCODONE-ACETAMINOPHEN 5-325 MG PO TABS
2.0000 | ORAL_TABLET | ORAL | Status: DC | PRN
  Administered 2011-09-17 – 2011-09-18 (×3): 2 via ORAL
  Filled 2011-09-17 (×3): qty 2

## 2011-09-17 MED ORDER — ONDANSETRON HCL 4 MG/2ML IJ SOLN: 4.0000 mg | INTRAMUSCULAR | Status: DC | PRN

## 2011-09-17 MED ORDER — CEFAZOLIN SODIUM 1-5 GM-% IV SOLN
1.0000 g | Freq: Three times a day (TID) | INTRAVENOUS | Status: AC
  Administered 2011-09-17 – 2011-09-18 (×2): 1 g via INTRAVENOUS
  Filled 2011-09-17 (×2): qty 50

## 2011-09-17 MED ORDER — MORPHINE SULFATE 4 MG/ML IJ SOLN: 4.0000 mg | INTRAMUSCULAR | Status: DC | PRN

## 2011-09-17 MED ORDER — PHENOL 1.4 % MT LIQD: 1.0000 | OROMUCOSAL | Status: DC | PRN

## 2011-09-17 MED ORDER — SCOPOLAMINE 1 MG/3DAYS TD PT72
MEDICATED_PATCH | TRANSDERMAL | Status: AC
  Administered 2011-09-17: 1 via TRANSDERMAL
  Filled 2011-09-17: qty 1

## 2011-09-17 MED ORDER — NEOSTIGMINE METHYLSULFATE 1 MG/ML IJ SOLN
INTRAMUSCULAR | Status: DC | PRN
  Administered 2011-09-17: 3 mg via INTRAVENOUS

## 2011-09-17 MED ORDER — MEPERIDINE HCL 25 MG/ML IJ SOLN: 6.2500 mg | INTRAMUSCULAR | Status: DC | PRN

## 2011-09-17 MED ORDER — THROMBIN 5000 UNITS EX KIT
PACK | CUTANEOUS | Status: DC | PRN
  Administered 2011-09-17 (×2): 5000 [IU] via TOPICAL

## 2011-09-17 SURGICAL SUPPLY — 58 items
BENZOIN TINCTURE PRP APPL 2/3 (GAUZE/BANDAGES/DRESSINGS) ×2 IMPLANT
BLADE SURG ROTATE 9660 (MISCELLANEOUS) IMPLANT
BUR ACORN 6.0 (BURR) ×2 IMPLANT
BUR MATCHSTICK NEURO 3.0 LAGG (BURR) ×2 IMPLANT
CANISTER SUCTION 2500CC (MISCELLANEOUS) ×2 IMPLANT
CLOTH BEACON ORANGE TIMEOUT ST (SAFETY) ×2 IMPLANT
CONT SPEC 4OZ CLIKSEAL STRL BL (MISCELLANEOUS) ×2 IMPLANT
DRAPE LAPAROTOMY 100X72X124 (DRAPES) ×2 IMPLANT
DRAPE MICROSCOPE LEICA (MISCELLANEOUS) ×2 IMPLANT
DRAPE POUCH INSTRU U-SHP 10X18 (DRAPES) ×2 IMPLANT
DRAPE PROXIMA HALF (DRAPES) ×2 IMPLANT
DRSG PAD ABDOMINAL 8X10 ST (GAUZE/BANDAGES/DRESSINGS) ×2 IMPLANT
DURAPREP 26ML APPLICATOR (WOUND CARE) ×2 IMPLANT
ELECT BLADE 4.0 EZ CLEAN MEGAD (MISCELLANEOUS) ×2
ELECT REM PT RETURN 9FT ADLT (ELECTROSURGICAL) ×2
ELECTRODE BLDE 4.0 EZ CLN MEGD (MISCELLANEOUS) ×1 IMPLANT
ELECTRODE REM PT RTRN 9FT ADLT (ELECTROSURGICAL) ×1 IMPLANT
GAUZE SPONGE 4X4 16PLY XRAY LF (GAUZE/BANDAGES/DRESSINGS) ×2 IMPLANT
GLOVE BIOGEL M 8.0 STRL (GLOVE) ×2 IMPLANT
GLOVE BIOGEL PI IND STRL 7.5 (GLOVE) ×1 IMPLANT
GLOVE BIOGEL PI IND STRL 8 (GLOVE) ×3 IMPLANT
GLOVE BIOGEL PI INDICATOR 7.5 (GLOVE) ×1
GLOVE BIOGEL PI INDICATOR 8 (GLOVE) ×3
GLOVE ECLIPSE 7.5 STRL STRAW (GLOVE) ×12 IMPLANT
GLOVE EXAM NITRILE LRG STRL (GLOVE) IMPLANT
GLOVE EXAM NITRILE MD LF STRL (GLOVE) IMPLANT
GLOVE EXAM NITRILE XL STR (GLOVE) IMPLANT
GLOVE EXAM NITRILE XS STR PU (GLOVE) IMPLANT
GOWN BRE IMP SLV AUR LG STRL (GOWN DISPOSABLE) ×4 IMPLANT
GOWN BRE IMP SLV AUR XL STRL (GOWN DISPOSABLE) ×2 IMPLANT
GOWN STRL REIN 2XL LVL4 (GOWN DISPOSABLE) ×4 IMPLANT
KIT BASIN OR (CUSTOM PROCEDURE TRAY) ×2 IMPLANT
KIT ROOM TURNOVER OR (KITS) ×2 IMPLANT
NEEDLE HYPO 18GX1.5 BLUNT FILL (NEEDLE) IMPLANT
NEEDLE HYPO 21X1.5 SAFETY (NEEDLE) ×2 IMPLANT
NEEDLE HYPO 25X1 1.5 SAFETY (NEEDLE) ×2 IMPLANT
NEEDLE SPNL 20GX3.5 QUINCKE YW (NEEDLE) ×2 IMPLANT
NS IRRIG 1000ML POUR BTL (IV SOLUTION) ×2 IMPLANT
PACK LAMINECTOMY NEURO (CUSTOM PROCEDURE TRAY) ×2 IMPLANT
PAD ARMBOARD 7.5X6 YLW CONV (MISCELLANEOUS) ×10 IMPLANT
PATTIES SURGICAL .5 X1 (DISPOSABLE) ×2 IMPLANT
RUBBERBAND STERILE (MISCELLANEOUS) ×4 IMPLANT
SPONGE GAUZE 4X4 12PLY (GAUZE/BANDAGES/DRESSINGS) ×2 IMPLANT
SPONGE GAUZE 4X4 STERILE 39 (GAUZE/BANDAGES/DRESSINGS) ×2 IMPLANT
SPONGE LAP 4X18 X RAY DECT (DISPOSABLE) IMPLANT
SPONGE SURGIFOAM ABS GEL SZ50 (HEMOSTASIS) ×2 IMPLANT
STRIP CLOSURE SKIN 1/2X4 (GAUZE/BANDAGES/DRESSINGS) ×2 IMPLANT
SUT VIC AB 0 CT1 18XCR BRD8 (SUTURE) ×1 IMPLANT
SUT VIC AB 0 CT1 8-18 (SUTURE) ×1
SUT VIC AB 2-0 CP2 18 (SUTURE) ×2 IMPLANT
SUT VIC AB 3-0 SH 8-18 (SUTURE) ×2 IMPLANT
SYR 20CC LL (SYRINGE) ×2 IMPLANT
SYR 20ML ECCENTRIC (SYRINGE) ×2 IMPLANT
SYR 5ML LL (SYRINGE) IMPLANT
TAPE CLOTH SURG 4X10 WHT LF (GAUZE/BANDAGES/DRESSINGS) ×2 IMPLANT
TOWEL OR 17X24 6PK STRL BLUE (TOWEL DISPOSABLE) ×2 IMPLANT
TOWEL OR 17X26 10 PK STRL BLUE (TOWEL DISPOSABLE) ×2 IMPLANT
WATER STERILE IRR 1000ML POUR (IV SOLUTION) ×2 IMPLANT

## 2011-09-17 NOTE — Progress Notes (Signed)
Left l34 discectomy and right l45 foraminotomy done.

## 2011-09-17 NOTE — Preoperative (Signed)
Beta Blockers   Reason not to administer Beta Blockers:Not Applicable 
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
## 2011-09-17 NOTE — Progress Notes (Signed)
C/o incisional pain. No leg pain.

## 2011-09-17 NOTE — Transfer of Care (Signed)
Immediate Anesthesia Transfer of Care Note  Patient: Jody Turner  Procedure(s) Performed: Procedure(s) (LRB): LUMBAR LAMINECTOMY/DECOMPRESSION MICRODISCECTOMY (N/A)  Patient Location: PACU  Anesthesia Type: General  Level of Consciousness: awake  Airway & Oxygen Therapy: Patient Spontanous Breathing and Patient connected to nasal cannula oxygen  Post-op Assessment: Report given to PACU RN and Post -op Vital signs reviewed and stable  Post vital signs: stable  Complications: No apparent anesthesia complications

## 2011-09-17 NOTE — Anesthesia Preprocedure Evaluation (Addendum)
Anesthesia Evaluation  Patient identified by MRN, date of birth, ID band Patient awake    Reviewed: Allergy & Precautions, H&P , NPO status , Patient's Chart, lab work & pertinent test results  History of Anesthesia Complications (+) PONV  Airway Mallampati: I TM Distance: >3 FB Neck ROM: Full    Dental  (+) Teeth Intact and Dental Advisory Given   Pulmonary  clear to auscultation        Cardiovascular hypertension, Pt. on medications Regular Normal    Neuro/Psych  Neuromuscular disease    GI/Hepatic   Endo/Other  Morbid obesity  Renal/GU      Musculoskeletal  (+) Fibromyalgia -  Abdominal   Peds  Hematology   Anesthesia Other Findings   Reproductive/Obstetrics                         Anesthesia Physical Anesthesia Plan  ASA: II  Anesthesia Plan: General   Post-op Pain Management:    Induction: Intravenous  Airway Management Planned: Oral ETT  Additional Equipment:   Intra-op Plan:   Post-operative Plan: Possible Post-op intubation/ventilation  Informed Consent: I have reviewed the patients History and Physical, chart, labs and discussed the procedure including the risks, benefits and alternatives for the proposed anesthesia with the patient or authorized representative who has indicated his/her understanding and acceptance.   Dental advisory given  Plan Discussed with: CRNA, Anesthesiologist and Surgeon  Anesthesia Plan Comments:         Anesthesia Quick Evaluation

## 2011-09-17 NOTE — Anesthesia Postprocedure Evaluation (Signed)
  Anesthesia Post-op Note  Patient: Jody Turner  Procedure(s) Performed: Procedure(s) (LRB): LUMBAR LAMINECTOMY/DECOMPRESSION MICRODISCECTOMY (N/A)  Patient Location: PACU  Anesthesia Type: General  Level of Consciousness: awake, alert  and oriented  Airway and Oxygen Therapy: Patient Spontanous Breathing and Patient connected to nasal cannula oxygen  Post-op Pain: mild  Post-op Assessment: Post-op Vital signs reviewed, Patient's Cardiovascular Status Stable, Respiratory Function Stable, Patent Airway, No signs of Nausea or vomiting and Pain level controlled  Post-op Vital Signs: Reviewed and stable  Complications: No apparent anesthesia complications

## 2011-09-17 NOTE — Anesthesia Procedure Notes (Signed)
Procedure Name: Intubation Date/Time: 09/17/2011 11:42 AM Performed by: Romie Minus Pre-anesthesia Checklist: Patient identified, Emergency Drugs available, Suction available and Patient being monitored Patient Re-evaluated:Patient Re-evaluated prior to inductionOxygen Delivery Method: Circle system utilized Preoxygenation: Pre-oxygenation with 100% oxygen Intubation Type: IV induction Ventilation: Mask ventilation without difficulty and Oral airway inserted - appropriate to patient size Grade View: Grade I Tube type: Oral Tube size: 7.5 mm Number of attempts: 1 Airway Equipment and Method: Video-laryngoscopy Placement Confirmation: positive ETCO2 and breath sounds checked- equal and bilateral Secured at: 22 cm Tube secured with: Tape Dental Injury: Teeth and Oropharynx as per pre-operative assessment

## 2011-09-17 NOTE — Addendum Note (Signed)
Addendum  created 09/17/11 1511 by Kerby Nora, MD   Modules edited:Orders

## 2011-09-18 NOTE — Discharge Summary (Signed)
Physician Discharge Summary  Patient ID: Jody Turner MRN: [id number] DOB/AGE: [id number] y.o.  Admit date: 09/17/2011 Discharge date: 09/18/2011  Admission Diagnoses: lumbar spondylosis and disk herniation    Discharge Diagnoses: same   Discharged Condition: good  Hospital Course: The patient was admitted on 09/17/2011 and taken to the operating room where the patient underwent LL. The patient tolerated the procedure well and was taken to the recovery room and then to the floor in stable condition. The hospital course was routine. There were no complications. The wound remained clean dry and intact. The patient remained afebrile with stable vital signs, and tolerated a regular diet. The patient continued to increase activities, and pain was well controlled with oral pain medications.   Consults: None  Significant Diagnostic Studies:  Results for orders placed during the hospital encounter of 09/17/11  BASIC METABOLIC PANEL      Component Value Range   Sodium 139  135 - 145 (mEq/L)   Potassium 4.7  3.5 - 5.1 (mEq/L)   Chloride 105  96 - 112 (mEq/L)   CO2 28  19 - 32 (mEq/L)   Glucose, Bld 104 (*) 70 - 99 (mg/dL)   BUN 16  6 - 23 (mg/dL)   Creatinine, Ser 9.52  0.50 - 1.10 (mg/dL)   Calcium 9.3  8.4 - 84.1 (mg/dL)   GFR calc non Af Amer >90  >90 (mL/min)   GFR calc Af Amer >90  >90 (mL/min)  CBC      Component Value Range   WBC 4.4  4.0 - 10.5 (K/uL)   RBC 4.20  3.87 - 5.11 (MIL/uL)   Hemoglobin 12.4  12.0 - 15.0 (g/dL)   HCT 32.4 (*) 40.1 - 46.0 (%)   MCV 85.5  78.0 - 100.0 (fL)   MCH 29.5  26.0 - 34.0 (pg)   MCHC 34.5  30.0 - 36.0 (g/dL)   RDW 02.7  25.3 - 66.4 (%)   Platelets 230  150 - 400 (K/uL)  HCG, SERUM, QUALITATIVE      Component Value Range   Preg, Serum NEGATIVE  NEGATIVE     Dg Chest 2 View  08/27/2011  *RADIOLOGY REPORT*  Clinical Data: 51 year old female preoperative study for lumbar surgery.  CHEST - 2 VIEW  Comparison: CT thoracic myelogram  07/22/2011.  Findings: Lung volumes are within normal limits.  Cardiac size at the upper limits of normal. Other mediastinal contours are within normal limits.  Visualized tracheal air column is within normal limits.  The lungs are clear.  Cervical ACDF hardware partially visible.  Chronic L1 compression fracture.  IMPRESSION: No acute cardiopulmonary abnormality.  Original Report Authenticated By: Harley Hallmark, M.D.   Dg Lumbar Spine 1 View  09/17/2011  *RADIOLOGY REPORT*  Clinical Data: Lumbar microdiskectomy.  LUMBAR SPINE - 1 VIEW  Comparison: Studies from same date and 07/22/2011.  Findings: Single lateral view labeled 1215 hours.  Surgical device projects posterior to the L4 vertebral body.  Underpenetration. Degraded by patient body habitus.  IMPRESSION: Intraoperative localization of L4.  Original Report Authenticated By: Consuello Bossier, M.D.   Dg Lumbar Spine 1 View  09/17/2011  *RADIOLOGY REPORT*  Clinical Data: Localization for lumbar surgery.  LUMBAR SPINE - 1 VIEW  Comparison: 07/22/2011 post myelogram CT.  Findings: Exam limited by patient's habitus.  Lateral view intraoperative film reveals two metallic probes in place.  The superior metallic probe appears at the L3 spinous process level and the inferior metallic probe at the  L4 spinous process.  Results discussed by phone with Dr. Jeral Fruit.  On follow-up examinations, increased penetration may prove helpful.  IMPRESSION: Localization L3 and L4 spinous process.  Please see above.  Original Report Authenticated By: Fuller Canada, M.D.   Dg Spine Portable 1 View  09/17/2011  *RADIOLOGY REPORT*  Clinical Data: Microdiskectomy.  PORTABLE SPINE - 1 VIEW  Comparison:  Earlier today at 1216 hours.  Findings: Single lateral view labeled 1300 hours.  Serve device projects over the L3-L4 interspace.  Surgical devices also project posterior to the inferior aspect of the L4 vertebral body.  IMPRESSION: Intraoperative localization of L3-L4.  Original  Report Authenticated By: Consuello Bossier, M.D.    Antibiotics:  Anti-infectives     Start     Dose/Rate Route Frequency Ordered Stop   09/17/11 1945   ceFAZolin (ANCEF) IVPB 1 g/50 mL premix        1 g 100 mL/hr over 30 Minutes Intravenous Every 8 hours 09/17/11 1603 09/18/11 0349   09/16/11 1516   ceFAZolin (ANCEF) IVPB 2 g/50 mL premix        2 g 100 mL/hr over 30 Minutes Intravenous 60 min pre-op 09/16/11 1516 09/17/11 1145          Discharge Exam: Blood pressure 104/62, pulse 64, temperature 98.5 F (36.9 C), temperature source Oral, resp. rate 18, height 5\' 5"  (1.651 m), weight 117.935 kg (260 lb), last menstrual period 08/27/2011, SpO2 96.00%. Neurologic: Grossly normal  Discharge Medications:   Medication List  As of 09/18/2011  9:47 AM   TAKE these medications         citalopram 20 MG tablet   Commonly known as: CELEXA   Take 20 mg by mouth daily at 2 PM daily at 2 PM.      diazepam 5 MG tablet   Commonly known as: VALIUM   Take 5 mg by mouth every 8 (eight) hours as needed. For spasms      HYDROcodone-acetaminophen 5-325 MG per tablet   Commonly known as: NORCO   Take 1 tablet by mouth every 8 (eight) hours as needed. For pain      lisinopril 20 MG tablet   Commonly known as: PRINIVIL,ZESTRIL   Take 20 mg by mouth every morning.      Vitamin D (Ergocalciferol) 50000 UNITS Caps   Commonly known as: DRISDOL   Take 50,000 Units by mouth every 7 (seven) days. Friday            Disposition: home   Final Dx: [name] and microdiskectomy  Discharge Orders    Future Orders Please Complete By Expires   Diet - low sodium heart healthy      Increase activity slowly      Driving Restrictions      Comments:   1 week   Lifting restrictions      Comments:   Less than 8 lbs   Remove dressing in 24 hours      Call MD for:  temperature >100.4      Call MD for:  persistant nausea and vomiting      Call MD for:  severe uncontrolled pain      Call MD for:   redness, tenderness, or signs of infection (pain, swelling, redness, odor or green/yellow discharge around incision site)      Call MD for:  difficulty breathing, headache or visual disturbances         Follow-up Information    Follow  up with Karn Cassis, MD in 2 weeks.   Contact information:   [address] [place] [address] [phone]           Signed: Tia Alert 09/18/2011, 9:47 AM

## 2011-09-18 NOTE — Op Note (Signed)
Jody Turner, Jody Turner                   ACCOUNT NO.:  [id number]  MEDICAL RECORD NO.:  [id number]  LOCATION:  3040                         FACILITY:  [facility]  PHYSICIAN:  Hilda Lias, M.D.   DATE OF BIRTH:  24-Nov-1960  DATE OF PROCEDURE: DATE OF DISCHARGE:                              OPERATIVE REPORT   ADMISSION DIAGNOSES:  Left L3-L4 herniated disk.  Right L4-5 foraminotomy.  Fracture of L1, old.  Status post cervical fusion. Obesity.  POSTOPERATIVE DIAGNOSES:  Left L3-L4 herniated disk. Right L4-5 foraminotomy. Fracture of L1, old.  Status post cervical fusion. Obesity.  PROCEDURE:  Left L3-L4 diskectomy and foraminotomy.  Right L4-5 foraminotomy.  Microscope.  SURGEON:  Hilda Lias, MD  ASSISTANT:  Reinaldo Meeker, MD  CLINICAL HISTORY:  Jody Turner is a lady who had been seen in my office for many years.  She previously had anterior cervical fusion.  She had been complaining of back pain with radiation to both legs.  She had failed conservative treatment.  She has an old fracture of L1.  X-rays show a herniated disk at the level of 3-4 to the left and 4-5 stenosis to the right.  Surgery was advised.  She has some step-off between 3-4, which does not move with flexion and extension.  The patient and her husband knew the risk of the surgery including the possibility of further surgery, which might include fusion, infection, CSF leak, no improvement whatsoever because of the chronicity of pain.  PROCEDURE:  The patient was taken to the OR and after intubation, she was positioned in a prone manner.  The back was cleaned with DuraPrep. We tried to feel the spinous process, but it was difficult secondary to her adipose tissue.  Nevertheless, we were able to feel the iliac crest. We had an incision in the midline through the skin, thick adipose tissue straight down to the lumbar spine.  We had to take at least 4 x-rays just before we were able to see that we were at the level of  3-4.  From then on, we opened 3-4 on the left and 4-5 on the right side.  More x- rays were done just to be 100% sure.  In the left side, the patient had a vertical canal.  It was difficult to get into the lamina but at the end, we did a laminotomy at L3-4 with removal of thick calcified disk. Retraction of the thecal sac was done.  Immediately, right at the level of 3-4, mostly compromising the L2 nerve root, there was a large herniated disk with extension laterally.  Incision was made and total gross diskectomy, medial and lateral, was achieved.  At the end, we had a good space for the L3 and L4 nerve root.  In the right side, we did the same procedure with opening of the lamina of 4-5 and through a thick, also calcified yellow ligament.  With the drill and the 2 and 3 mm Kerrison punch, we did a foraminotomy to decompress the L5 and L4 nerve roots.  At the end, we had a good space.  Valsalva maneuver was negative.  From then on, the area  was irrigated.  The wound was closed with Vicryl and Steri-Strips. About 40% of the surgical time procedure was localized in the x-ray. The patient did well.  She is going to go to the recovery room.          ______________________________ Hilda Lias, M.D.     EB/MEDQ  D:  09/17/2011  T:  09/18/2011  Job:  [id number]

## 2011-09-18 NOTE — Progress Notes (Signed)
Order received, chart reviewed, spoke with PT who felt like pt without OT issues. Spoke briefly with pt and husband and they do not foresee any issues with BADLs and following back precautions. Pt has all DME at home and A prn per her report between her husband and her daughter. No OT needs identified and time spent was 3 minutes. No charge.  Ignacia Palma, [place] [phone] 09/18/2011
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
## 2011-09-21 ENCOUNTER — Encounter (HOSPITAL_COMMUNITY): Payer: Self-pay | Admitting: Neurosurgery

## 2011-11-24 ENCOUNTER — Other Ambulatory Visit: Payer: Self-pay | Admitting: Family Medicine

## 2011-11-24 DIAGNOSIS — Z1231 Encounter for screening mammogram for malignant neoplasm of breast: Secondary | ICD-10-CM

## 2011-12-17 ENCOUNTER — Ambulatory Visit: Payer: Medicare Other

## 2011-12-30 ENCOUNTER — Ambulatory Visit
Admission: RE | Admit: 2011-12-30 | Discharge: 2011-12-30 | Disposition: A | Discharge status: Home / Self Care | Payer: Medicare Other | Source: Ambulatory Visit | Attending: Family Medicine | Admitting: Family Medicine

## 2011-12-30 DIAGNOSIS — Z1231 Encounter for screening mammogram for malignant neoplasm of breast: Secondary | ICD-10-CM

## 2012-09-19 ENCOUNTER — Ambulatory Visit: Payer: Self-pay | Admitting: Pain Medicine

## 2012-10-02 ENCOUNTER — Ambulatory Visit: Payer: Self-pay | Admitting: Pain Medicine

## 2012-10-31 ENCOUNTER — Ambulatory Visit: Payer: Self-pay | Admitting: Pain Medicine

## 2012-11-15 ENCOUNTER — Ambulatory Visit: Payer: Self-pay | Admitting: Pain Medicine

## 2012-12-14 ENCOUNTER — Ambulatory Visit: Payer: Self-pay | Admitting: Pain Medicine

## 2012-12-27 ENCOUNTER — Ambulatory Visit: Payer: Self-pay | Admitting: Pain Medicine

## 2013-01-02 ENCOUNTER — Other Ambulatory Visit: Payer: Self-pay

## 2013-01-02 DIAGNOSIS — Z1231 Encounter for screening mammogram for malignant neoplasm of breast: Secondary | ICD-10-CM

## 2013-01-09 ENCOUNTER — Ambulatory Visit: Payer: Self-pay | Admitting: Pain Medicine

## 2013-01-30 ENCOUNTER — Ambulatory Visit
Admission: RE | Admit: 2013-01-30 | Discharge: 2013-01-30 | Disposition: A | Discharge status: Home / Self Care | Payer: Medicare Other | Source: Ambulatory Visit

## 2013-01-30 DIAGNOSIS — Z1231 Encounter for screening mammogram for malignant neoplasm of breast: Secondary | ICD-10-CM

## 2013-03-13 ENCOUNTER — Ambulatory Visit: Payer: Self-pay | Admitting: Pain Medicine

## 2013-04-10 ENCOUNTER — Ambulatory Visit: Payer: Self-pay | Admitting: Pain Medicine

## 2013-07-10 ENCOUNTER — Ambulatory Visit: Payer: Self-pay | Admitting: Pain Medicine

## 2013-07-26 DIAGNOSIS — C4491 Basal cell carcinoma of skin, unspecified: Secondary | ICD-10-CM

## 2013-07-26 HISTORY — DX: Basal cell carcinoma of skin, unspecified: C44.91

## 2013-08-28 ENCOUNTER — Ambulatory Visit: Payer: Self-pay | Admitting: Pain Medicine

## 2013-10-23 ENCOUNTER — Ambulatory Visit: Payer: Self-pay | Admitting: Pain Medicine

## 2013-10-24 ENCOUNTER — Ambulatory Visit (INDEPENDENT_AMBULATORY_CARE_PROVIDER_SITE_OTHER): Payer: Medicare Other

## 2013-10-24 ENCOUNTER — Ambulatory Visit (INDEPENDENT_AMBULATORY_CARE_PROVIDER_SITE_OTHER): Payer: Medicare Other | Admitting: Podiatry

## 2013-10-24 ENCOUNTER — Encounter: Payer: Self-pay | Admitting: Podiatry

## 2013-10-24 VITALS — Resp 16 | Ht 66.0 in | Wt 280.0 lb

## 2013-10-24 DIAGNOSIS — B351 Tinea unguium: Secondary | ICD-10-CM

## 2013-10-24 DIAGNOSIS — M79609 Pain in unspecified limb: Secondary | ICD-10-CM

## 2013-10-24 DIAGNOSIS — M79673 Pain in unspecified foot: Secondary | ICD-10-CM

## 2013-10-24 DIAGNOSIS — M722 Plantar fascial fibromatosis: Secondary | ICD-10-CM

## 2013-10-24 MED ORDER — METHYLPREDNISOLONE (PAK) 4 MG PO TABS: ORAL_TABLET | ORAL | Status: DC

## 2013-10-24 MED ORDER — TAVABOROLE 5 % EX SOLN: CUTANEOUS | Status: DC

## 2013-10-24 NOTE — Progress Notes (Signed)
   Subjective:    Patient ID: Jody Turner, female    DOB: 03/06/1961, 53 y.o.   MRN: [id number]  HPI Comments: My feet hurt all over. The ankles, feet, the bottom and on top. They've been hurting for 6 months and they are getting worse. It hurts to walk and sit. i have used icy hot, mobic, excedrin, and elevating.  Foot Pain Associated symptoms include headaches, numbness and weakness.      Review of Systems  Eyes: Negative.   Respiratory: Negative.   Cardiovascular: Positive for leg swelling.  Gastrointestinal: Negative.   Endocrine: Negative.   Genitourinary: Negative.   Musculoskeletal:       Joint pain Back pain Difficulty walking Muscle pain  Skin: Negative.   Allergic/Immunologic: Positive for environmental allergies and food allergies.  Neurological: Positive for weakness, numbness and headaches.  Hematological: Negative.   Psychiatric/Behavioral: Negative.        Objective:   Physical Exam: I have reviewed her past medical history medications allergies surgeries social history. Pulses are strongly palpable bilateral neurologic sensorium is intact bilateral. Deep tendon reflexes are in non-elicitable. Muscle strength is 4/5 dorsiflexors plantar flexors inverters everters all intrinsic musculature is intact. She has pain on direct palpation of the medial continued tubercles bilateral. Radiographic evaluation does demonstrate a soft tissue increase in density at the plantar fascial calcaneal insertion sites bilateral. Cutaneous evaluation demonstrates supple well hydrated cutis with exception of the hallux nail plate left and fourth digit of the left foot which does appear to be mycotic.        Assessment & Plan:  Assessment: Plantar fasciitis with lateral compensatory syndrome bilateral. Onychomycosis hallux and fourth digit left.  Plan: Discussed etiology pathology conservative versus surgical therapies. I started him on a topical antifungal. And I also injected her  heels bilaterally with Kenalog and local anesthetic. She also received a night splint. She also received oral steroidal anti-inflammatories consisting of a Medrol Dosepak. She will discontinue her MOBIC during that time and restart after she is finished be prednisone.

## 2013-11-08 ENCOUNTER — Telehealth: Payer: Self-pay | Admitting: *Deleted

## 2013-11-08 NOTE — Telephone Encounter (Signed)
Pt called and stated JGGEZMOQHUTM called her regarding RX for kerydin. Told pt that it will cost $1,000.00 to fill and pt told them she did not want it. Pt states she wants the oral medication for the fungus sent over to [facility] on [address]. Do you want her to have the lamisil?

## 2013-11-08 NOTE — Telephone Encounter (Signed)
Yes Lamisil will be fine but she will need a cbc and liver profile.  You might want to call Maudie Mercury and let her know that her product had be refused by a patient for price.

## 2013-11-12 ENCOUNTER — Encounter: Payer: Self-pay | Admitting: Podiatry

## 2013-11-12 ENCOUNTER — Telehealth: Payer: Self-pay | Admitting: *Deleted

## 2013-11-12 ENCOUNTER — Other Ambulatory Visit: Payer: Self-pay | Admitting: Podiatry

## 2013-11-12 DIAGNOSIS — Z79899 Other long term (current) drug therapy: Secondary | ICD-10-CM

## 2013-11-12 MED ORDER — TERBINAFINE HCL 250 MG PO TABS: 250.0000 mg | ORAL_TABLET | Freq: Every day | ORAL | Status: DC

## 2013-11-12 NOTE — Telephone Encounter (Signed)
SENT LAMISIL 250 MG #30 0 REFILLS TO [facility] [address] [place] PER DR HYATT.

## 2013-11-12 NOTE — Progress Notes (Signed)
Open in error

## 2013-11-12 NOTE — Progress Notes (Signed)
Opened in error

## 2013-11-19 ENCOUNTER — Encounter: Payer: Self-pay | Admitting: Podiatry

## 2013-11-20 ENCOUNTER — Ambulatory Visit: Payer: Self-pay | Admitting: Pain Medicine

## 2013-12-05 ENCOUNTER — Ambulatory Visit (INDEPENDENT_AMBULATORY_CARE_PROVIDER_SITE_OTHER): Payer: Medicare Other | Admitting: Podiatry

## 2013-12-05 ENCOUNTER — Encounter: Payer: Self-pay | Admitting: Podiatry

## 2013-12-05 VITALS — BP 118/82 | HR 68 | Resp 16

## 2013-12-05 DIAGNOSIS — Z79899 Other long term (current) drug therapy: Secondary | ICD-10-CM

## 2013-12-05 DIAGNOSIS — M722 Plantar fascial fibromatosis: Secondary | ICD-10-CM

## 2013-12-05 MED ORDER — DICLOFENAC SODIUM 75 MG PO TBEC: 75.0000 mg | DELAYED_RELEASE_TABLET | Freq: Two times a day (BID) | ORAL | Status: DC

## 2013-12-05 MED ORDER — TERBINAFINE HCL 250 MG PO TABS: 250.0000 mg | ORAL_TABLET | Freq: Every day | ORAL | Status: DC

## 2013-12-05 NOTE — Progress Notes (Signed)
She presents today for followup of her Lamisil therapy as well as her plantar fasciitis she states she's doing quite well there is no erythema edema saline is drainage or odor his had no rashes. She has pain on palpation with ambulation with shoe gear.  Objective: Pulses are strongly palpable bilateral. Pain on palpation medial continued tubercle right heel greater than left. No changes in her plates as of yet.  Assessment: Onychomycosis and plantar fasciitis bilateral.  Plan: We will recheck her liver profile once again today and write a prescription for her final 90 days of Lamisil. She was also given bilateral steroid injections to the point of maximal tenderness of the bilateral heels and also changed her nonsteroidal anti-inflammatory drug from the meloxicam to diclofenac and I will followup with her in 4 months she will call sooner if needed.

## 2013-12-06 LAB — CBC WITH DIFFERENTIAL/PLATELET
BASOS ABS: 0 10*3/uL (ref 0.0–0.2)
Basos: 0 %
EOS ABS: 0.1 10*3/uL (ref 0.0–0.4)
Eos: 1 %
HCT: 37.8 % (ref 34.0–46.6)
HEMOGLOBIN: 12.9 g/dL (ref 11.1–15.9)
IMMATURE GRANS (ABS): 0 10*3/uL (ref 0.0–0.1)
Immature Granulocytes: 0 %
LYMPHS: 26 %
Lymphocytes Absolute: 2.1 10*3/uL (ref 0.7–3.1)
MCH: 28.5 pg (ref 26.6–33.0)
MCHC: 34.1 g/dL (ref 31.5–35.7)
MCV: 84 fL (ref 79–97)
MONOCYTES: 6 %
Monocytes Absolute: 0.5 10*3/uL (ref 0.1–0.9)
NEUTROS PCT: 67 %
Neutrophils Absolute: 5.5 10*3/uL (ref 1.4–7.0)
RBC: 4.52 x10E6/uL (ref 3.77–5.28)
RDW: 15.6 % — ABNORMAL HIGH (ref 12.3–15.4)
WBC: 8.2 10*3/uL (ref 3.4–10.8)

## 2013-12-06 LAB — HEPATIC FUNCTION PANEL
ALT: 26 IU/L (ref 0–32)
AST: 18 IU/L (ref 0–40)
Albumin: 4.1 g/dL (ref 3.5–5.5)
Alkaline Phosphatase: 71 IU/L (ref 39–117)
BILIRUBIN TOTAL: 0.4 mg/dL (ref 0.0–1.2)
Bilirubin, Direct: 0.12 mg/dL (ref 0.00–0.40)
Total Protein: 6.1 g/dL (ref 6.0–8.5)

## 2013-12-07 ENCOUNTER — Telehealth: Payer: Self-pay | Admitting: *Deleted

## 2013-12-07 NOTE — Telephone Encounter (Signed)
Message copied by Dierdre Searles on Fri Dec 07, 2013  4:35 PM ------      Message from: Tyson Dense T      Created: Thu Dec 06, 2013  7:17 AM       Blood work is good may continue with current regimen of medication. ------

## 2013-12-07 NOTE — Telephone Encounter (Signed)
Spoke to patient and told  Her that her bloodwork was fine and to continue with her medication

## 2013-12-25 ENCOUNTER — Ambulatory Visit: Payer: Self-pay | Admitting: Pain Medicine

## 2014-01-21 ENCOUNTER — Other Ambulatory Visit: Payer: Self-pay

## 2014-01-21 DIAGNOSIS — Z1231 Encounter for screening mammogram for malignant neoplasm of breast: Secondary | ICD-10-CM

## 2014-01-22 ENCOUNTER — Ambulatory Visit: Payer: Self-pay | Admitting: Pain Medicine

## 2014-02-01 ENCOUNTER — Encounter (INDEPENDENT_AMBULATORY_CARE_PROVIDER_SITE_OTHER): Payer: Self-pay

## 2014-02-01 ENCOUNTER — Ambulatory Visit
Admission: RE | Admit: 2014-02-01 | Discharge: 2014-02-01 | Disposition: A | Discharge status: Home / Self Care | Payer: Medicare Other | Source: Ambulatory Visit

## 2014-02-01 DIAGNOSIS — Z1231 Encounter for screening mammogram for malignant neoplasm of breast: Secondary | ICD-10-CM

## 2014-03-05 ENCOUNTER — Ambulatory Visit: Payer: Self-pay | Admitting: Pain Medicine

## 2014-04-10 ENCOUNTER — Encounter: Payer: Self-pay | Admitting: Podiatry

## 2014-04-10 ENCOUNTER — Ambulatory Visit (INDEPENDENT_AMBULATORY_CARE_PROVIDER_SITE_OTHER): Payer: Medicare Other | Admitting: Podiatry

## 2014-04-10 VITALS — BP 116/71 | HR 70 | Resp 20

## 2014-04-10 DIAGNOSIS — M79609 Pain in unspecified limb: Secondary | ICD-10-CM

## 2014-04-10 DIAGNOSIS — M722 Plantar fascial fibromatosis: Secondary | ICD-10-CM

## 2014-04-10 MED ORDER — METHYLPREDNISOLONE (PAK) 4 MG PO TABS: ORAL_TABLET | ORAL | Status: DC

## 2014-04-10 MED ORDER — NABUMETONE 750 MG PO TABS: 750.0000 mg | ORAL_TABLET | Freq: Two times a day (BID) | ORAL | Status: DC

## 2014-04-10 NOTE — Progress Notes (Signed)
She presents today for followup of her Lamisil therapy. She states that she is continuing to take her medication. Her toenails seem to be growing out very slowly. She's also complaining of bilateral heel pain. States that this is seems to be getting worse. We also discussed whether or not she decided to purchase orthotics and she states that she has not decided yet because she has no extremity.  Objective: Vital signs are stable she is alert and oriented x3. She's pain on palpation medial calcaneal tubercle of her bilateral heels. Her nails are growing out with exception of the fourth left.  Assessment: Onychomycosis chronic proximal plantar fasciitis bilateral.  Plan: Continue her Lamisil therapy and I will followup with her in a few months. Injected her bilateral heels today with Kenalog and local anesthetic and placed her on prednisone once again.

## 2014-04-16 ENCOUNTER — Ambulatory Visit: Payer: Self-pay | Admitting: Pain Medicine

## 2014-06-27 ENCOUNTER — Ambulatory Visit: Payer: Self-pay | Admitting: Emergency Medicine

## 2014-07-01 ENCOUNTER — Ambulatory Visit: Payer: Self-pay | Admitting: Pain Medicine

## 2014-07-10 ENCOUNTER — Ambulatory Visit: Payer: Self-pay | Admitting: Pain Medicine

## 2014-07-30 ENCOUNTER — Ambulatory Visit: Payer: Self-pay | Admitting: Pain Medicine

## 2014-08-29 ENCOUNTER — Ambulatory Visit: Payer: Self-pay | Admitting: Pain Medicine

## 2014-09-24 ENCOUNTER — Ambulatory Visit: Payer: Self-pay | Admitting: Pain Medicine

## 2014-11-05 ENCOUNTER — Ambulatory Visit
Admit: 2014-11-05 | Disposition: A | Discharge status: Home / Self Care | Payer: Self-pay | Attending: Pain Medicine | Admitting: Pain Medicine

## 2014-11-18 ENCOUNTER — Ambulatory Visit (INDEPENDENT_AMBULATORY_CARE_PROVIDER_SITE_OTHER): Payer: Medicare Other

## 2014-11-18 ENCOUNTER — Encounter: Payer: Self-pay | Admitting: Podiatry

## 2014-11-18 ENCOUNTER — Ambulatory Visit (INDEPENDENT_AMBULATORY_CARE_PROVIDER_SITE_OTHER): Payer: Medicare Other | Admitting: Podiatry

## 2014-11-18 VITALS — BP 121/86 | HR 76 | Resp 16

## 2014-11-18 DIAGNOSIS — M722 Plantar fascial fibromatosis: Secondary | ICD-10-CM

## 2014-11-18 DIAGNOSIS — M7751 Other enthesopathy of right foot: Secondary | ICD-10-CM | POA: Diagnosis not present

## 2014-11-18 DIAGNOSIS — M778 Other enthesopathies, not elsewhere classified: Secondary | ICD-10-CM

## 2014-11-18 DIAGNOSIS — M779 Enthesopathy, unspecified: Secondary | ICD-10-CM

## 2014-11-18 MED ORDER — DICLOFENAC SODIUM 75 MG PO TBEC: 75.0000 mg | DELAYED_RELEASE_TABLET | Freq: Two times a day (BID) | ORAL | Status: DC

## 2014-11-19 NOTE — Progress Notes (Signed)
She presents today with chief complaint of sharp intense pain to the right ankle as she points to the sinus tarsi area of the right foot she states that her heels are also hurting her. She states this is been going on for quite some time she's doing nothing other than ibuprofen to help.  Objective: Vital signs are stable she's alert and oriented 3 pulses are palpable bilateral. She has pain on palpation medial calcaneal tubercle bilateral foot. She also has pain on range of motion of the subtalar joint right foot.  Assessment: Plantar fasciitis bilateral subtalar joint capsulitis right.  Plan: Injected bilateral heels today with Kenalog and local anesthetic. And inject the subtalar joint today right with prolonged local anesthetic.

## 2014-12-03 ENCOUNTER — Encounter: Payer: Medicare Other | Admitting: Pain Medicine

## 2014-12-29 ENCOUNTER — Other Ambulatory Visit: Payer: Self-pay | Admitting: Pain Medicine

## 2014-12-29 DIAGNOSIS — M5481 Occipital neuralgia: Secondary | ICD-10-CM | POA: Insufficient documentation

## 2014-12-29 DIAGNOSIS — M5136 Other intervertebral disc degeneration, lumbar region: Secondary | ICD-10-CM

## 2014-12-29 DIAGNOSIS — M533 Sacrococcygeal disorders, not elsewhere classified: Secondary | ICD-10-CM | POA: Insufficient documentation

## 2014-12-29 DIAGNOSIS — M5030 Other cervical disc degeneration, unspecified cervical region: Secondary | ICD-10-CM | POA: Insufficient documentation

## 2014-12-29 DIAGNOSIS — M47816 Spondylosis without myelopathy or radiculopathy, lumbar region: Secondary | ICD-10-CM | POA: Insufficient documentation

## 2015-01-02 ENCOUNTER — Encounter: Payer: Self-pay | Admitting: Pain Medicine

## 2015-01-02 ENCOUNTER — Encounter (INDEPENDENT_AMBULATORY_CARE_PROVIDER_SITE_OTHER): Payer: Self-pay

## 2015-01-02 ENCOUNTER — Ambulatory Visit: Payer: Medicare Other | Attending: Pain Medicine | Admitting: Pain Medicine

## 2015-01-02 VITALS — BP 106/57 | HR 65 | Temp 96.3°F | Resp 16 | Ht 65.0 in | Wt 270.0 lb

## 2015-01-02 DIAGNOSIS — M5030 Other cervical disc degeneration, unspecified cervical region: Secondary | ICD-10-CM | POA: Diagnosis not present

## 2015-01-02 DIAGNOSIS — Z981 Arthrodesis status: Secondary | ICD-10-CM | POA: Diagnosis not present

## 2015-01-02 DIAGNOSIS — M5124 Other intervertebral disc displacement, thoracic region: Secondary | ICD-10-CM | POA: Insufficient documentation

## 2015-01-02 DIAGNOSIS — M79605 Pain in left leg: Secondary | ICD-10-CM | POA: Diagnosis present

## 2015-01-02 DIAGNOSIS — M5134 Other intervertebral disc degeneration, thoracic region: Secondary | ICD-10-CM | POA: Insufficient documentation

## 2015-01-02 DIAGNOSIS — M5136 Other intervertebral disc degeneration, lumbar region: Secondary | ICD-10-CM | POA: Insufficient documentation

## 2015-01-02 DIAGNOSIS — M47816 Spondylosis without myelopathy or radiculopathy, lumbar region: Secondary | ICD-10-CM

## 2015-01-02 DIAGNOSIS — M4806 Spinal stenosis, lumbar region: Secondary | ICD-10-CM | POA: Insufficient documentation

## 2015-01-02 DIAGNOSIS — I8390 Asymptomatic varicose veins of unspecified lower extremity: Secondary | ICD-10-CM | POA: Diagnosis not present

## 2015-01-02 DIAGNOSIS — M533 Sacrococcygeal disorders, not elsewhere classified: Secondary | ICD-10-CM | POA: Insufficient documentation

## 2015-01-02 DIAGNOSIS — M4186 Other forms of scoliosis, lumbar region: Secondary | ICD-10-CM | POA: Insufficient documentation

## 2015-01-02 DIAGNOSIS — M545 Low back pain: Secondary | ICD-10-CM | POA: Diagnosis present

## 2015-01-02 DIAGNOSIS — M5481 Occipital neuralgia: Secondary | ICD-10-CM

## 2015-01-02 DIAGNOSIS — M79604 Pain in right leg: Secondary | ICD-10-CM | POA: Diagnosis present

## 2015-01-02 MED ORDER — TIZANIDINE HCL 2 MG PO TABS: ORAL_TABLET | ORAL | Status: DC

## 2015-01-02 MED ORDER — HYDROCODONE-ACETAMINOPHEN 5-325 MG PO TABS: ORAL_TABLET | ORAL | Status: DC

## 2015-01-02 MED ORDER — TOPIRAMATE 25 MG PO TABS: ORAL_TABLET | ORAL | Status: DC

## 2015-01-02 NOTE — Progress Notes (Signed)
   Subjective:    Patient ID: Jody Turner, female    DOB: Jan 13, 1961, 54 y.o.   MRN: [id number]  HPI   patient 54 year old female returns to pain management Center for further evaluation and treatment of pain involving the region of the lower back lower extremity region with pain occurring the reason the neck associated with headaches as well. Patient states she is continues to do remarkably well at this time animistic some return of lower back pain radiating to the buttocks on the left as well as on the right. Region is recently undergoing vascular treatment of her lower extremities. We will consider interventional treatment for her lower back lower extremity pain once the symptoms become more intense. The present time patient appears to be with pain fairly well-controlled with the Topamax Zanaflex and hydrocodone acetaminophen. We will consider patient for 6 treatment as discussed pending response to present treatment and follow-up evaluation. The patient was understanding and agreement status treatment plan.    Review of Systems     Objective:   Physical Exam   there was tenderness of spinous Occipitalis region palpation with reproduced moderate discomfort   Lesions of the head and neck were noted. There was tenderness over the cervical facet cervical paraspinal musculature dural regions palpation which reproduced mild to moderate discomfort. Palpation of the acromioclavicular glenohumeral joint region was without increased pain of any significant degree. There was unremarkable Spurling's maneuver There was mild to moderate tends of the thoracic facet thoracic paraspinal musculature region and no crepitus of the thoracic region noted. Palpation over the region of the lumbar paraspinal muscles lumbar facet region associated tends to palpation of moderate degree. Lateral bending and rotation and extension and palpation of the lumbar facets reproduce moderate discomfort. Palpation of the PSIS and  PII S regions reproduced moderate discomfort. There was moderate tends to palpation over the greater trochanteric region iliotibial band region. Straight leg raising tolerate Foster 30 without increase of pain with dorsiflexion noted. EHL strength appeared to be decreased and there was no definite sensory deficit of dermatomal distribution detected. Abdomen nontender and no costovertebral tenderness was noted.      Assessment & Plan:     Dgenerative disc disease cervical spine  status post C5-C6 anterior cervical discectomy fusion   Degenerative disc disease thoracic spine  multilevel degenerative disc disease thoracic spine with central and leftward protrusion at T8 9 which does not appear to result in significant cord compression   Degenerative disc disease lumbar spine   moderate stenosis L1 to related to chronic L1 compression fracture mild degenerative scoliosis convex right slightly worsened his prior study asymmetric nerve root encroachment at L2-3, left and L4-5, right   Lumbar facet syndrome   Sacroiliac joint dysfunction   Varicose veins    Plan  Continue present medications. Topamax Zanaflex and hydrocodone acetaminophen  F/U PCP for evaliation of  BP and general medical  condition.  F/U surgical evaluation. Dr.Botero  F/U neurological evaluation.  May consider radiofrequency rhizolysis or intraspinal procedures pending response to present treatment and F/U evaluation.  Patient to call [facility] Management Center should patient have concerns prior to scheduled return appointment.

## 2015-01-02 NOTE — Progress Notes (Signed)
Safety precautions to be maintained throughout the outpatient stay will include: orient to surroundings, keep bed in low position, maintain call bell within reach at all times, provide assistance with transfer out of bed and ambulation.  

## 2015-01-02 NOTE — Patient Instructions (Signed)
Continue present medications. Topamax Zanaflex and hydrocodone acetaminophen   F/U PCP for evaliation of  BP and general medical  condition.  F/U surgical evaluation.  F/U neurological evaluation.  May consider radiofrequency rhizolysis or intraspinal procedures pending response to present treatment and F/U evaluation.  Patient to call [facility] Management Center should patient have concerns prior to scheduled return appointment.

## 2015-02-03 ENCOUNTER — Encounter: Payer: Self-pay | Admitting: Pain Medicine

## 2015-02-03 ENCOUNTER — Ambulatory Visit: Payer: Medicare Other | Attending: Pain Medicine | Admitting: Pain Medicine

## 2015-02-03 VITALS — BP 120/73 | HR 76 | Temp 98.4°F | Resp 18 | Ht 66.0 in | Wt 270.0 lb

## 2015-02-03 DIAGNOSIS — M47816 Spondylosis without myelopathy or radiculopathy, lumbar region: Secondary | ICD-10-CM | POA: Diagnosis not present

## 2015-02-03 DIAGNOSIS — M5030 Other cervical disc degeneration, unspecified cervical region: Secondary | ICD-10-CM

## 2015-02-03 DIAGNOSIS — M533 Sacrococcygeal disorders, not elsewhere classified: Secondary | ICD-10-CM | POA: Diagnosis not present

## 2015-02-03 DIAGNOSIS — M79605 Pain in left leg: Secondary | ICD-10-CM | POA: Diagnosis present

## 2015-02-03 DIAGNOSIS — M5481 Occipital neuralgia: Secondary | ICD-10-CM | POA: Diagnosis not present

## 2015-02-03 DIAGNOSIS — M5126 Other intervertebral disc displacement, lumbar region: Secondary | ICD-10-CM | POA: Diagnosis not present

## 2015-02-03 DIAGNOSIS — M7060 Trochanteric bursitis, unspecified hip: Secondary | ICD-10-CM | POA: Insufficient documentation

## 2015-02-03 DIAGNOSIS — M47812 Spondylosis without myelopathy or radiculopathy, cervical region: Secondary | ICD-10-CM | POA: Insufficient documentation

## 2015-02-03 DIAGNOSIS — M2578 Osteophyte, vertebrae: Secondary | ICD-10-CM | POA: Diagnosis not present

## 2015-02-03 DIAGNOSIS — M5136 Other intervertebral disc degeneration, lumbar region: Secondary | ICD-10-CM | POA: Diagnosis not present

## 2015-02-03 DIAGNOSIS — M79604 Pain in right leg: Secondary | ICD-10-CM | POA: Diagnosis present

## 2015-02-03 DIAGNOSIS — M4802 Spinal stenosis, cervical region: Secondary | ICD-10-CM | POA: Insufficient documentation

## 2015-02-03 DIAGNOSIS — M545 Low back pain: Secondary | ICD-10-CM | POA: Diagnosis present

## 2015-02-03 MED ORDER — TOPIRAMATE 25 MG PO TABS: ORAL_TABLET | ORAL | Status: DC

## 2015-02-03 MED ORDER — HYDROCODONE-ACETAMINOPHEN 5-325 MG PO TABS: ORAL_TABLET | ORAL | Status: DC

## 2015-02-03 MED ORDER — TIZANIDINE HCL 2 MG PO TABS: ORAL_TABLET | ORAL | Status: DC

## 2015-02-03 NOTE — Progress Notes (Signed)
Safety precautions to be maintained throughout the outpatient stay will include: orient to surroundings, keep bed in low position, maintain call bell within reach at all times, provide assistance with transfer out of bed and ambulation. Discharged ambulatory at 1:30 pm

## 2015-02-03 NOTE — Progress Notes (Signed)
Subjective:    Patient ID: Jody Turner, female    DOB: 04-06-1961, 54 y.o.   MRN: [id number]  HPI  Patient is 54 year old female who returns to [facility] for further evaluation and treatment of pain involving the lower back and lower extremity region as well as pain involving the neck and headaches. On today's visit patient states that she has had some return of lower back and lower extremity pain. Patient states that she will be in need of interventional treatment for treatment of her lower back and lower extremity pain. Some. Patient continues medications consisting of Topamax Zanaflex and hydrocodone acetaminophen. This time. Patient is undergone vascular procedures of the lower extremities recently and we will remain available to proceed with interventional treatment pending further assessment of patient's condition. Patient denies any trauma or other changes in events of daily living the call significant change in symptomatology. We will continue medications as prescribed and will consider interventional treatment pending further evaluation. Patient is been advised to call Pain Management Center prior to scheduled return appointment should there be significant change in condition prior to scheduled return appointment. The patient is understanding and agrees with suggested treatment plan.     Review of Systems     Objective:   Physical Exam Outpatient of the trapezius musculature region was a tends to palpation of moderate degree there was limited range of motion of the cervical spine with rotation limited to the left as well as to the right. There appeared to be unremarkable Spurling's maneuver. Palpation of the cervical facet cervical paraspinal musculature region and the thoracic facet and thoracic paraspinal musculature region reproduces moderate discomfort. Tinel and Phalen's maneuver without increased pain of significant degree. Palpation over the thoracic facet thoracic  paraspinal musculature region was without crepitus of the thoracic region noted. Palpation over the lumbar paraspinal musculature region lumbar facet region was a tends to palpation of moderate to moderately severe degree. Palpation over the PSIS and PII S regions reproduced severe discomfort on the left as well as on the right. There was moderately severe tenderness to palpation of the greater trochanteric region and iliotibial band region on the left as well as on the right. Straight leg raising was limited to approximately 20 without increased pain with dorsiflexion noted. There was negative clonus negative Homans. Abdomen nontender and no costovertebral angle tenderness noted.       Assessment & Plan:  Degenerative disc disease lumbar spine Degenerative changes L1-2 through L5-S1 with disc protrusion and mass effect upon the thecal sac and disc protrusion and facet hypertrophy potentially affect in the S1 nerve the right side  Degenerative disc disease of the cervical spine Degenerative changes cervical spine C2-3, C4-5, C6 mass effect upon the ventral aspect of the thecal sac of the cervical cord with C3 foraminal stenosis associated with facet arthritis  Degenerative disc disease thoracic spine Degenerative changes T8-T9 especially with disc osteophyte complex producing mild to moderate central now stenosis with disc osteophyte complex contacting with indentation of the ventral thecal sac  Bilateral occipital neuralgia  Sacroiliac joint dysfunction  Greater trochanteric bursitis   Plan Continue present medications Topamax hydrocodone acetaminophen and Zanaflex  F/U PCP for evaliation of  BP and general medical  condition.  F/U surgical evaluation  F/U neurological evaluation  May consider radiofrequency rhizolysis or intraspinal procedures pending response to present treatment and F/U evaluation.  Patient to call [facility] Management Center should patient have concerns prior to  scheduled return appointment.

## 2015-02-03 NOTE — Patient Instructions (Addendum)
Continue present medications  F/U PCP for evaliation of  BP and general medical  condition.  F/U surgical evaluation  F/U neurological evaluation  May consider radiofrequency rhizolysis or intraspinal procedures pending response to present treatment and F/U evaluation.  Patient to call [facility] should patient have concerns prior to scheduled return appointment.   A prescription for TIZANIDINE, TOPIRAMATE, and HYDROCODONE was given to you today.

## 2015-02-05 ENCOUNTER — Other Ambulatory Visit: Payer: Self-pay

## 2015-02-05 DIAGNOSIS — Z1231 Encounter for screening mammogram for malignant neoplasm of breast: Secondary | ICD-10-CM

## 2015-02-14 ENCOUNTER — Ambulatory Visit
Admission: RE | Admit: 2015-02-14 | Discharge: 2015-02-14 | Disposition: A | Discharge status: Home / Self Care | Payer: Medicare Other | Source: Ambulatory Visit

## 2015-02-14 DIAGNOSIS — Z1231 Encounter for screening mammogram for malignant neoplasm of breast: Secondary | ICD-10-CM

## 2015-02-20 ENCOUNTER — Other Ambulatory Visit: Payer: Self-pay | Admitting: Pain Medicine

## 2015-03-04 ENCOUNTER — Encounter: Payer: Medicare Other | Admitting: Pain Medicine

## 2015-03-11 ENCOUNTER — Ambulatory Visit: Payer: Medicare Other | Attending: Pain Medicine | Admitting: Pain Medicine

## 2015-03-11 VITALS — BP 122/62 | HR 72 | Temp 97.9°F | Resp 16 | Ht 66.0 in | Wt 270.0 lb

## 2015-03-11 DIAGNOSIS — M4804 Spinal stenosis, thoracic region: Secondary | ICD-10-CM | POA: Diagnosis not present

## 2015-03-11 DIAGNOSIS — M542 Cervicalgia: Secondary | ICD-10-CM | POA: Diagnosis present

## 2015-03-11 DIAGNOSIS — M5136 Other intervertebral disc degeneration, lumbar region: Secondary | ICD-10-CM

## 2015-03-11 DIAGNOSIS — M47816 Spondylosis without myelopathy or radiculopathy, lumbar region: Secondary | ICD-10-CM

## 2015-03-11 DIAGNOSIS — M5481 Occipital neuralgia: Secondary | ICD-10-CM | POA: Diagnosis not present

## 2015-03-11 DIAGNOSIS — M47814 Spondylosis without myelopathy or radiculopathy, thoracic region: Secondary | ICD-10-CM | POA: Insufficient documentation

## 2015-03-11 DIAGNOSIS — M7060 Trochanteric bursitis, unspecified hip: Secondary | ICD-10-CM | POA: Insufficient documentation

## 2015-03-11 DIAGNOSIS — M79602 Pain in left arm: Secondary | ICD-10-CM | POA: Diagnosis present

## 2015-03-11 DIAGNOSIS — M533 Sacrococcygeal disorders, not elsewhere classified: Secondary | ICD-10-CM | POA: Insufficient documentation

## 2015-03-11 DIAGNOSIS — M5030 Other cervical disc degeneration, unspecified cervical region: Secondary | ICD-10-CM

## 2015-03-11 DIAGNOSIS — M2578 Osteophyte, vertebrae: Secondary | ICD-10-CM | POA: Diagnosis not present

## 2015-03-11 DIAGNOSIS — M5134 Other intervertebral disc degeneration, thoracic region: Secondary | ICD-10-CM | POA: Insufficient documentation

## 2015-03-11 DIAGNOSIS — M79601 Pain in right arm: Secondary | ICD-10-CM | POA: Diagnosis present

## 2015-03-11 MED ORDER — HYDROCODONE-ACETAMINOPHEN 5-325 MG PO TABS: ORAL_TABLET | ORAL | Status: DC

## 2015-03-11 MED ORDER — TOPIRAMATE 25 MG PO TABS: ORAL_TABLET | ORAL | Status: DC

## 2015-03-11 MED ORDER — TIZANIDINE HCL 2 MG PO TABS: ORAL_TABLET | ORAL | Status: DC

## 2015-03-11 NOTE — Progress Notes (Signed)
Safety precautions to be maintained throughout the outpatient stay will include: orient to surroundings, keep bed in low position, maintain call bell within reach at all times, provide assistance with transfer out of bed and ambulation.  

## 2015-03-11 NOTE — Patient Instructions (Signed)
Continue present medications Zanaflex Topamax and hydrocodone acetaminophen  F/U PCP Dr. Kary Kos for evaliation of  BP and general medical  condition  F/U surgical evaluation  F/U neurological evaluation  F/U vascular evaluation  May consider radiofrequency rhizolysis or intraspinal procedures pending response to present treatment and F/U evaluation   Patient to call [facility] Management Center should patient have concerns prior to scheduled return appointmen.

## 2015-03-11 NOTE — Progress Notes (Signed)
   Subjective:    Patient ID: Jody Turner, female    DOB: 02-15-1961, 54 y.o.   MRN: [id number] HPI  Patient is 54 year old female returns to pain management Center for further evaluation and treatment of pain involving the neck upper extremity region. Lower back and lower extremity regions. Patient has recently undergone vascular procedures of the lower extremities patient is with improvement of her lower extremity varicose veins following prior treatment. Patient stated that she does have pain occurring across region the back and radiating to the buttocks on the left and right sides which is aggravated by standing and walking twisting turning maneuvers. We discussed patient's condition present time we'll continue presently prescribed medications Topamax Zanaflex and hydrocodone acetaminophen and remain vitals consider patient for modification of treatment should there be significant change in patient's condition. The patient was with understanding and in agreement status treatment plan.    Review of Systems     Objective:   Physical Exam  There was mild tenderness of the splenius capitis and occipitalis musculature region. Palpation of the region of the cervical facet cervical paraspinal muscles region associated with mild discomfort. If you to be unremarkable Spurling's maneuver. Patient was with bilaterally equal grip strength. Tinel and Phalen's maneuver without increase of pain significant degree. Palpation of the thoracic facet thoracic paraspinal musculature region was without significant increased pain and no crepitus of the thoracic region was noted. Palpation over the region of the lumbar paraspinal musculature region lumbar facet region was with moderate discomfort. There was moderate to moderately severe discomfort palpation over the PSIS and PII S regions. There was moderate tends to palpation of the greater trochanteric region and iliotibial band region. Straight leg raising was  tolerates approximately 20 without increase of pain with dorsiflexion noted. There was negative clonus negative Homans. Abdomen was without excessive tends to palpation and no costovertebral angle tenderness was noted.        Assessment & Plan:    Degenerative disc disease thoracic spine Degenerative changes T8-T9 especially with disc osteophyte complex producing mild to moderate central now stenosis with disc osteophyte complex contacting with indentation of the ventral thecal sac  Bilateral occipital neuralgia  Sacroiliac joint dysfunction  Greater trochanteric bursitis   Plan   Continue present medications Topamax Zanaflex and hydrocodone acetaminophen  F/U PCP Dr. Kary Kos for evaliation of  BP and general medical  condition  F/U surgical evaluation  F/U neurological evaluation  F/U vascular evaluation  May consider radiofrequency rhizolysis or intraspinal procedures pending response to present treatment and F/U evaluation   Patient to call [facility] Management Center should patient have concerns prior to scheduled return appointmen.

## 2015-04-10 ENCOUNTER — Encounter: Payer: Self-pay | Admitting: Pain Medicine

## 2015-04-10 ENCOUNTER — Ambulatory Visit: Payer: Medicare Other | Attending: Pain Medicine | Admitting: Pain Medicine

## 2015-04-10 VITALS — BP 139/78 | HR 64 | Temp 98.1°F | Resp 20 | Ht 65.5 in | Wt 270.0 lb

## 2015-04-10 DIAGNOSIS — M5030 Other cervical disc degeneration, unspecified cervical region: Secondary | ICD-10-CM | POA: Diagnosis not present

## 2015-04-10 DIAGNOSIS — M4806 Spinal stenosis, lumbar region: Secondary | ICD-10-CM | POA: Diagnosis not present

## 2015-04-10 DIAGNOSIS — M4186 Other forms of scoliosis, lumbar region: Secondary | ICD-10-CM | POA: Insufficient documentation

## 2015-04-10 DIAGNOSIS — M170 Bilateral primary osteoarthritis of knee: Secondary | ICD-10-CM | POA: Diagnosis not present

## 2015-04-10 DIAGNOSIS — M5481 Occipital neuralgia: Secondary | ICD-10-CM | POA: Diagnosis not present

## 2015-04-10 DIAGNOSIS — M4856XA Collapsed vertebra, not elsewhere classified, lumbar region, initial encounter for fracture: Secondary | ICD-10-CM | POA: Diagnosis not present

## 2015-04-10 DIAGNOSIS — M19011 Primary osteoarthritis, right shoulder: Secondary | ICD-10-CM | POA: Insufficient documentation

## 2015-04-10 DIAGNOSIS — M533 Sacrococcygeal disorders, not elsewhere classified: Secondary | ICD-10-CM

## 2015-04-10 DIAGNOSIS — M19012 Primary osteoarthritis, left shoulder: Secondary | ICD-10-CM | POA: Insufficient documentation

## 2015-04-10 DIAGNOSIS — Z981 Arthrodesis status: Secondary | ICD-10-CM | POA: Diagnosis not present

## 2015-04-10 DIAGNOSIS — M5136 Other intervertebral disc degeneration, lumbar region: Secondary | ICD-10-CM | POA: Diagnosis not present

## 2015-04-10 DIAGNOSIS — M47816 Spondylosis without myelopathy or radiculopathy, lumbar region: Secondary | ICD-10-CM

## 2015-04-10 DIAGNOSIS — M5134 Other intervertebral disc degeneration, thoracic region: Secondary | ICD-10-CM | POA: Diagnosis not present

## 2015-04-10 DIAGNOSIS — M51369 Other intervertebral disc degeneration, lumbar region without mention of lumbar back pain or lower extremity pain: Secondary | ICD-10-CM

## 2015-04-10 DIAGNOSIS — M79605 Pain in left leg: Secondary | ICD-10-CM | POA: Diagnosis present

## 2015-04-10 DIAGNOSIS — M545 Low back pain: Secondary | ICD-10-CM | POA: Diagnosis present

## 2015-04-10 DIAGNOSIS — M7060 Trochanteric bursitis, unspecified hip: Secondary | ICD-10-CM | POA: Insufficient documentation

## 2015-04-10 DIAGNOSIS — M79604 Pain in right leg: Secondary | ICD-10-CM | POA: Diagnosis present

## 2015-04-10 DIAGNOSIS — M5124 Other intervertebral disc displacement, thoracic region: Secondary | ICD-10-CM | POA: Diagnosis not present

## 2015-04-10 MED ORDER — HYDROCODONE-ACETAMINOPHEN 5-325 MG PO TABS: ORAL_TABLET | ORAL | Status: DC

## 2015-04-10 MED ORDER — TOPIRAMATE 25 MG PO TABS: ORAL_TABLET | ORAL | Status: DC

## 2015-04-10 MED ORDER — TIZANIDINE HCL 2 MG PO CAPS: ORAL_CAPSULE | ORAL | Status: DC

## 2015-04-10 NOTE — Progress Notes (Signed)
Subjective:    Patient ID: Jody Turner, female    DOB: [birth year], 54 y.o.   MRN: [id number]  HPI  Patient is 54 year old female who returns to [facility] for further evaluation and treatment of lower back lower extremity pain. Patient states the pain is well controlled at this time. Patient also with history of headaches as well as pain involving the shoulders and knees. Patient states that she is tolerating the Topamax Zanaflex and hydrocodone acetaminophen without complaint of significant pain. We have discussed patient's condition and will consider patient for treatment including interventional treatment should there be significant change in condition prior to scheduled return appointment. The patient was in agreement with suggested treatment plan. Patient will continue Zanaflex Topamax and hydrocodone acetaminophen at this time and will call Pain Management Center should be change in condition prior to scheduled return appointment     Review of Systems     Objective:   Physical Exam  Was mild tends of the splenius capitis and occipitalis musculature region. There appeared to be unremarkable Spurling's maneuver. There was mild tenderness of the cervical facet cervical paraspinal musculature region and the thoracic facet thoracic paraspinal musculature region. Patient appeared to be with bilaterally equal grip strength. Tinel and Phalen's maneuver without increased pain of significant degree. Palpation of the lower thoracic paraspinal musculature and lower thoracic facet region was with moderate tends to palpation with no crepitus of the thoracic region noted. There was tends to palpation over the region of the acromioclavicular and glenohumeral joint regions of mild to moderate degree. With limited range of motion of the acromioclavicular joint. Palpation of the lumbar paraspinal muscles region lumbar facet region associated with moderate discomfort with moderately severe tends to  palpation of the PSIS PII S region as well as the greater trochanteric region and iliotibial band region. There was moderate tends to palpation of the gluteal and piriformis musculature regions. Lateral bending rotation and extension and palpation of the lumbar facets reproduce moderate to moderately severe discomfort. DTRs were difficult to elicit patient had difficulty relaxing. EHL strength appeared to be decreased there definite sensory deficit of dermatomal distribution was detected. There was negative clonus negative Homans. The lower extremities were without excessive tends to palpation and without erythema Abdomen nontender with no costovertebral maintenance noted      Assessment & Plan:    Degenerative joint disease of shoulders  Degenerative joint disease of knees  Bilateral occipital neuralgia  Sacroiliac joint dysfunction  Greater trochanteric bursitis   Dgenerative disc disease cervical spine  status post C5-C6 anterior cervical discectomy fusion   Degenerative disc disease thoracic spine  multilevel degenerative disc disease thoracic spine with central and leftward protrusion at T8 9 which does not appear to result in significant cord compression   Degenerative disc disease lumbar spine   moderate stenosis L1 to related to chronic L1 compression fracture mild degenerative scoliosis convex right slightly worsened his prior study asymmetric nerve root encroachment at L2-3, left and L4-5, right    PLAN   Continue present medication Zanaflex Topamax and hydrocodone acetaminophen  F/U PCP Dr. Bertrum Sol for evaliation of  BP and general medical  condition  F/U surgical evaluation.   F/U neurological evaluation. May consider pending follow-up evaluations  F/U vascular evaluation as discussed  May consider radiofrequency rhizolysis or intraspinal procedures pending response to present treatment and F/U evaluation   Patient to call [facility] should patient  have concerns prior to scheduled return appointment.

## 2015-04-10 NOTE — Patient Instructions (Signed)
PLAN   Continue present medication Topamax Zanaflex and hydrocodone acetaminophen  F/U PCP Dr. Bertrum Sol for evaliation of  BP and general medical  condition  F/U surgical evaluation. May consider pending follow-up evaluations  F/U neurological evaluation. May consider pending follow-up evaluations  F/U vascular evaluation as needed  May consider radiofrequency rhizolysis or intraspinal procedures pending response to present treatment and F/U evaluation   Patient to call [facility] Center should patient have concerns prior to scheduled return appointment.

## 2015-04-10 NOTE — Progress Notes (Signed)
Safety precautions to be maintained throughout the outpatient stay will include: orient to surroundings, keep bed in low position, maintain call bell within reach at all times, provide assistance with transfer out of bed and ambulation.  

## 2015-05-08 ENCOUNTER — Encounter: Payer: Self-pay | Admitting: Pain Medicine

## 2015-05-08 ENCOUNTER — Ambulatory Visit: Payer: Medicare Other | Attending: Pain Medicine | Admitting: Pain Medicine

## 2015-05-08 VITALS — BP 138/76 | HR 59 | Temp 98.7°F | Resp 16 | Ht 65.5 in | Wt 270.0 lb

## 2015-05-08 DIAGNOSIS — M542 Cervicalgia: Secondary | ICD-10-CM | POA: Diagnosis present

## 2015-05-08 DIAGNOSIS — M19011 Primary osteoarthritis, right shoulder: Secondary | ICD-10-CM | POA: Insufficient documentation

## 2015-05-08 DIAGNOSIS — M5136 Other intervertebral disc degeneration, lumbar region: Secondary | ICD-10-CM | POA: Insufficient documentation

## 2015-05-08 DIAGNOSIS — M7060 Trochanteric bursitis, unspecified hip: Secondary | ICD-10-CM | POA: Diagnosis not present

## 2015-05-08 DIAGNOSIS — M5134 Other intervertebral disc degeneration, thoracic region: Secondary | ICD-10-CM | POA: Diagnosis not present

## 2015-05-08 DIAGNOSIS — G43909 Migraine, unspecified, not intractable, without status migrainosus: Secondary | ICD-10-CM | POA: Diagnosis not present

## 2015-05-08 DIAGNOSIS — Z981 Arthrodesis status: Secondary | ICD-10-CM | POA: Insufficient documentation

## 2015-05-08 DIAGNOSIS — M19012 Primary osteoarthritis, left shoulder: Secondary | ICD-10-CM | POA: Diagnosis not present

## 2015-05-08 DIAGNOSIS — M5030 Other cervical disc degeneration, unspecified cervical region: Secondary | ICD-10-CM | POA: Diagnosis not present

## 2015-05-08 DIAGNOSIS — M533 Sacrococcygeal disorders, not elsewhere classified: Secondary | ICD-10-CM

## 2015-05-08 DIAGNOSIS — M5481 Occipital neuralgia: Secondary | ICD-10-CM | POA: Insufficient documentation

## 2015-05-08 DIAGNOSIS — R51 Headache: Secondary | ICD-10-CM | POA: Diagnosis present

## 2015-05-08 DIAGNOSIS — M47816 Spondylosis without myelopathy or radiculopathy, lumbar region: Secondary | ICD-10-CM

## 2015-05-08 DIAGNOSIS — M170 Bilateral primary osteoarthritis of knee: Secondary | ICD-10-CM | POA: Insufficient documentation

## 2015-05-08 MED ORDER — HYDROCODONE-ACETAMINOPHEN 5-325 MG PO TABS: ORAL_TABLET | ORAL | Status: DC

## 2015-05-08 MED ORDER — TIZANIDINE HCL 2 MG PO TABS: ORAL_TABLET | ORAL | Status: DC

## 2015-05-08 MED ORDER — TOPIRAMATE 25 MG PO TABS: ORAL_TABLET | ORAL | Status: DC

## 2015-05-08 NOTE — Progress Notes (Signed)
Safety precautions to be maintained throughout the outpatient stay will include: orient to surroundings, keep bed in low position, maintain call bell within reach at all times, provide assistance with transfer out of bed and ambulation.  

## 2015-05-08 NOTE — Patient Instructions (Addendum)
PLAN   Continue present medication Zanaflex Topamax and hydrocodone acetaminophen  F/U PCP Dr. Kary Kos for evaliation of  BP and general medical  condition  F/U surgical evaluation. May consider pending follow-up evaluations  F/U neurological evaluation. May consider pending follow-up evaluations  F/U vascular evaluation as needed  May consider radiofrequency rhizolysis or intraspinal procedures pending response to present treatment and F/U evaluation   Patient to call [facility] Management Center should patient have concerns prior to scheduled return appointment.  A prescription for HYDROCODONE, TOPAMAX, ZANAFLEX was given to you today.

## 2015-05-08 NOTE — Progress Notes (Signed)
   Subjective:    Patient ID: Jody Turner, female    DOB: 1960/08/11, 54 y.o.   MRN: [id number]  HPI Patient 54 year old female returns to pain management for further evaluation and treatment of pain involving the neck headaches upper mid lower back and lower extremity region. Patient states that she does have headaches which occur on a rather frequent basis. Patient states that she can turn her head to the right when lying in bed and can precipitate a migraine headache. We discussed patient's overall condition and patient appeared to be with component of greater occipital neuralgia which appears to be triggering her migraine headaches. We will inform patient that we can consider greater occipital nerve blocks and other treatment while patient continue Topamax Zanaflex and hydrocodone acetaminophen and that the frequency and intensity of her headaches may significantly decrease. We will remain available to consider patient for such treatment as discussed. We discussed patient's lower back and lower extremity pain which appear to be fairly well-controlled at this time. Patient appeared to be with significant component of bursitis. We will remain available to consider patient interventional treatment as discussed. The patient was understanding and agree suggested treatment plan.   Review of Systems     Objective:   Physical Exam There was moderate to moderate severe tenderness of the splenius capitis and occipitalis musculature region on the left as well as on the right. No new masses of the head and neck were noted. There were no bounding pulsations of the temporal region. Palpation over the region of the cervical facet cervical paraspinal musculature region with tinged palpation of moderately severe degree. There appeared to be moderate severe tenderness to palpation of the trapezius levator scapula and rhomboid musculature region. Patient appeared to be with unremarkable Spurling's maneuver. Tinel and  Phalen's maneuver without increased pain of significant degree. Palpation over the lumbar paraspinal muscle lumbar facet region associated with moderate discomfort. There was moderate tenderness of the PSIS PII S region gluteal and piriformis musculature regions. Moderate tenderness to palpation was noted in the greater trochanteric region and iliotibial band region. Straight leg raise was tolerated to 30 without increased pain with dorsiflexion noted. There was tenderness to palpation of the knees. There was negative anterior and posterior drawer signs without ballottement of the patella. Crepitus of the knees been noted. EHL strength appeared to be decreased with no sensory deficit of dermatomal dispute detected. There was negative clonus negative Homans. Abdomen nontender and no costovertebral tenderness was noted.     ASSESSMENT  Bilateral occipital neuralgia  Migraine headaches   Degenerative joint disease of shoulders  Degenerative joint disease of knees  Sacroiliac joint dysfunction  Greater trochanteric bursitis   Dgenerative disc disease cervical spine  status post C5-C6 anterior cervical discectomy fusion   Degenerative disc disease thoracic spine     PLAN   Continue present medication Zanaflex Topamax and oxycodone  Recommend patient consider greater occipital nerve block for treatment of headaches and pain of the cervical region  F/U PCP  Dr. Kary Kos for evaliation of  BP and general medical  condition  F/U surgical evaluation. May consider pending follow-up evaluations  F/U neurological evaluation. May consider pending follow-up evaluations  F/U vascular evaluation  May consider radiofrequency rhizolysis or intraspinal procedures pending response to present treatment and F/U evaluation   Patient to call [facility] should patient have concerns prior to scheduled return appointment.

## 2015-06-10 ENCOUNTER — Ambulatory Visit: Payer: Medicare Other | Attending: Pain Medicine | Admitting: Pain Medicine

## 2015-06-10 ENCOUNTER — Encounter: Payer: Self-pay | Admitting: Pain Medicine

## 2015-06-10 VITALS — BP 126/63 | HR 69 | Temp 98.4°F | Resp 18 | Ht 65.5 in | Wt 270.0 lb

## 2015-06-10 DIAGNOSIS — M7060 Trochanteric bursitis, unspecified hip: Secondary | ICD-10-CM | POA: Diagnosis not present

## 2015-06-10 DIAGNOSIS — M5134 Other intervertebral disc degeneration, thoracic region: Secondary | ICD-10-CM | POA: Diagnosis not present

## 2015-06-10 DIAGNOSIS — M47814 Spondylosis without myelopathy or radiculopathy, thoracic region: Secondary | ICD-10-CM | POA: Insufficient documentation

## 2015-06-10 DIAGNOSIS — M2578 Osteophyte, vertebrae: Secondary | ICD-10-CM | POA: Diagnosis not present

## 2015-06-10 DIAGNOSIS — M47816 Spondylosis without myelopathy or radiculopathy, lumbar region: Secondary | ICD-10-CM

## 2015-06-10 DIAGNOSIS — R51 Headache: Secondary | ICD-10-CM | POA: Insufficient documentation

## 2015-06-10 DIAGNOSIS — M5030 Other cervical disc degeneration, unspecified cervical region: Secondary | ICD-10-CM | POA: Insufficient documentation

## 2015-06-10 DIAGNOSIS — M5136 Other intervertebral disc degeneration, lumbar region: Secondary | ICD-10-CM | POA: Diagnosis not present

## 2015-06-10 DIAGNOSIS — M533 Sacrococcygeal disorders, not elsewhere classified: Secondary | ICD-10-CM | POA: Diagnosis not present

## 2015-06-10 DIAGNOSIS — M5481 Occipital neuralgia: Secondary | ICD-10-CM | POA: Insufficient documentation

## 2015-06-10 DIAGNOSIS — M4804 Spinal stenosis, thoracic region: Secondary | ICD-10-CM | POA: Insufficient documentation

## 2015-06-10 DIAGNOSIS — M542 Cervicalgia: Secondary | ICD-10-CM | POA: Diagnosis present

## 2015-06-10 MED ORDER — TIZANIDINE HCL 2 MG PO TABS: ORAL_TABLET | ORAL | Status: DC

## 2015-06-10 MED ORDER — HYDROCODONE-ACETAMINOPHEN 5-325 MG PO TABS: ORAL_TABLET | ORAL | Status: DC

## 2015-06-10 MED ORDER — TOPIRAMATE 25 MG PO TABS: ORAL_TABLET | ORAL | Status: DC

## 2015-06-10 NOTE — Patient Instructions (Addendum)
PLAN  c 

## 2015-06-10 NOTE — Progress Notes (Signed)
Safety precautions to be maintained throughout the outpatient stay will include: orient to surroundings, keep bed in low position, maintain call bell within reach at all times, provide assistance with transfer out of bed and ambulation.  

## 2015-06-10 NOTE — Progress Notes (Signed)
Subjective:    Patient ID: Jody Turner, female    DOB: Sep 20, 1960, 54 y.o.   MRN: [id number]  HPI   The patient is a 54 year old female who returns to pain management Center for further evaluation and treatment of pain involving the neck associated with headaches as well as pain involving the upper extremity regions upper mid and lower back and lower extremity regions. Patient will undergo follow-up evaluation with Dr. Joya Salm for neurosurgical be assessment of her condition. We will remain available to proceed with interventional treatment pending neurosurgical disposition. The patient denies trauma change in events of daily living to cause significant change in symptomatology. Patient states she has problems with region lifting pushing pulling maneuvers. Patient also admits the lower back virtually pain aggravated by standing walking and states the pain also interferes with ability to obtain restful sleep. Patient states that she is planning to do more to take care of herself in the coming year. We remain available to consider modifications of treatment as discussed with patient on today's visit all are understanding and in agreement with suggested treatment plan.      Review of Systems     Objective:   Physical Exam  There was tenderness to palpation of paraspinal musculature region of the cervical region cervical facet region palpation of the reproduced pain of moderately severe degree. There was limited range of motion of the cervical spine and patient appeared to be with unremarkable Spurling's maneuver at this time. Patient was with decreased grip strength and Tinel and Phalen's maneuver were without increase of pain of significant degree palpation over the thoracic facet thoracic paraspinal muscles reproduced pain of moderate degree with moderate muscle spasms noted in the thoracic region. No crepitus of the thoracic region was noted. Palpation over the lumbar paraspinal musculatures and  lumbar facet region was attends to palpation of moderate degree. Lateral bending and rotation extension and palpation over the lumbar facets reproduced moderate discomfort. There was moderately severe tenderness to palpation of the PSIS and P IIS region as well as moderate tenderness to palpation of the greater trochanteric region and iliotibial band region. Straight leg raise was tolerates approximately 20 without increased pain with dorsiflexion noted. There was negative clonus negative Homans. Knees were with negative anterior and posterior drawer signs without ballottement of the patella. There was tenderness to palpation of the knees. Patient appeared to be with negative anterior and posterior drawer signs. There was negative clonus negative Homans. Abdomen was nontender with no costovertebral tenderness noted.          Assessment & Plan:   Degenerative disc disease thoracic spine Degenerative changes T8-T9 especially with disc osteophyte complex producing mild to moderate central now stenosis with disc osteophyte complex contacting with indentation of the ventral thecal sac  Bilateral occipital neuralgia  Sacroiliac joint dysfunction  Greater trochanteric bursitis  Degenerative disc disease cervical spine  Cervical facet syndrome  Degenerative disc disease lumbar spine  Lumbar facet syndrome    PLAN    Continue present medication Zanaflex Topamax and oxycodone  F/U PCP  Dr. Kary Kos for evaliation of  BP and general medical  condition  F/U surgical evaluation. Patient will follow-up with Dr.Botero   F/U neurological evaluation. May consider pending follow-up evaluations  F/U vascular evaluation  May consider radiofrequency rhizolysis or intraspinal procedures pending response to present treatment and F/U evaluation   Patient to call Pain Management Center should patient have concerns prior to scheduled return appointment.

## 2015-07-10 ENCOUNTER — Encounter: Payer: Medicare Other | Admitting: Pain Medicine

## 2015-07-17 ENCOUNTER — Encounter: Payer: Medicare Other | Admitting: Pain Medicine

## 2015-07-24 ENCOUNTER — Ambulatory Visit: Payer: Medicare Other | Attending: Pain Medicine | Admitting: Pain Medicine

## 2015-07-24 ENCOUNTER — Encounter: Payer: Self-pay | Admitting: Pain Medicine

## 2015-07-24 VITALS — BP 121/69 | HR 74 | Temp 98.2°F | Resp 16 | Ht 65.0 in | Wt 270.0 lb

## 2015-07-24 DIAGNOSIS — M5136 Other intervertebral disc degeneration, lumbar region: Secondary | ICD-10-CM | POA: Insufficient documentation

## 2015-07-24 DIAGNOSIS — M47814 Spondylosis without myelopathy or radiculopathy, thoracic region: Secondary | ICD-10-CM | POA: Diagnosis not present

## 2015-07-24 DIAGNOSIS — M7060 Trochanteric bursitis, unspecified hip: Secondary | ICD-10-CM | POA: Insufficient documentation

## 2015-07-24 DIAGNOSIS — M545 Low back pain: Secondary | ICD-10-CM | POA: Insufficient documentation

## 2015-07-24 DIAGNOSIS — M5481 Occipital neuralgia: Secondary | ICD-10-CM | POA: Insufficient documentation

## 2015-07-24 DIAGNOSIS — M533 Sacrococcygeal disorders, not elsewhere classified: Secondary | ICD-10-CM | POA: Insufficient documentation

## 2015-07-24 DIAGNOSIS — M2578 Osteophyte, vertebrae: Secondary | ICD-10-CM | POA: Diagnosis not present

## 2015-07-24 DIAGNOSIS — M5030 Other cervical disc degeneration, unspecified cervical region: Secondary | ICD-10-CM | POA: Insufficient documentation

## 2015-07-24 DIAGNOSIS — M4804 Spinal stenosis, thoracic region: Secondary | ICD-10-CM | POA: Insufficient documentation

## 2015-07-24 DIAGNOSIS — M542 Cervicalgia: Secondary | ICD-10-CM | POA: Diagnosis present

## 2015-07-24 DIAGNOSIS — R51 Headache: Secondary | ICD-10-CM | POA: Diagnosis present

## 2015-07-24 DIAGNOSIS — M47816 Spondylosis without myelopathy or radiculopathy, lumbar region: Secondary | ICD-10-CM

## 2015-07-24 DIAGNOSIS — M5134 Other intervertebral disc degeneration, thoracic region: Secondary | ICD-10-CM | POA: Insufficient documentation

## 2015-07-24 MED ORDER — HYDROCODONE-ACETAMINOPHEN 5-325 MG PO TABS: ORAL_TABLET | ORAL | Status: DC

## 2015-07-24 MED ORDER — TIZANIDINE HCL 2 MG PO TABS: ORAL_TABLET | ORAL | Status: DC

## 2015-07-24 MED ORDER — TOPIRAMATE 25 MG PO TABS: ORAL_TABLET | ORAL | Status: DC

## 2015-07-24 NOTE — Progress Notes (Signed)
   Subjective:    Patient ID: Jody Turner, female    DOB: March 25, 1961, 54 y.o.   MRN: [id number]  HPI  The patient is a 54 year old female who returns to pain management for further evaluation and treatment of pain involving the neck headaches upper extremities especially the left shoulder upper mid lower back and lower extremity region. The patient states that she begin to have significant pain involving the left shoulder. The patient is to undergo surgical evaluation by orthopedic surgeon [facility] as planned. The patient states that the lower back lower extremity pain is of lesser degree and that her lower back pain and bursitis fairly well-controlled at this time. We discussed interventional treatment and will avoid interventional treatment at this time. The patient will continue her present medications including Topamax Zanaflex and hydrocodone acetaminophen. The patient states that her pain is aggravated at times by prolonged standing and walking. The patient states that she recently fell in bathroom and was without severe injury Arnita significant medical attention after the fall. We'll continue medications as prescribed and patient is to call pain management should they be change in condition prior to scheduled return appointment. The patient agrees to suggested treatment plan.      Review of Systems     Objective:   Physical Exam  There was tends to palpation of paraspinal musculature and cervical region cervical facet region a moderate degree. There appeared to be unremarkable Spurling's maneuver. Palpation over the cervical facet cervical paraspinal musculature region and splenius capitis and occipitalis musculature regions reproduce moderate discomfort. The patient appeared to be with slightly decreased grip strength and Tinel and Phalen's maneuver were without increased pain of significant degree. Palpation over the thoracic facet thoracic paraspinal musculature  region was evidence of muscle spasm moderate degree. No crepitus of the thoracic region was noted. Palpation over the lumbar paraspinal musculatures and lumbar facet region was with moderate tends to palpation with moderate tends to palpation of the greater trochanteric region iliotibial band region. Palpation over the PSIS and PII S regions reproduce moderate discomfort as well. A leg raising was limited to approximately 20 without increased pain with dorsiflexion noted. There was negative clonus negative Homans without sensory deficit or dermatomal distribution detected. Strength appeared to be slightly decreased. DTRs were difficult to this patient had difficulty relaxing. Abdomen was without excessive tends to palpation and no costovertebral tenderness was noted.    Assessment & Plan:       Degenerative disc disease thoracic spine Degenerative changes T8-T9 especially with disc osteophyte complex producing mild to moderate central now stenosis with disc osteophyte complex contacting with indentation of the ventral thecal sac  Bilateral occipital neuralgia  Sacroiliac joint dysfunction  Greater trochanteric bursitis  Degenerative disc disease cervical spine  Cervical facet syndrome  Degenerative disc disease lumbar spine  Lumbar facet syndrome      PLAN   Continue present medication Zanaflex Topamax and hydrocodone acetaminophen  F/U PCP Dr. Kary Kos for evaliation of  BP and general medical  condition  F/U surgical evaluation. Surgical evaluation of shoulder [facility] as discussed and as planned  F/U neurological evaluation. May consider pending follow-up evaluations  F/U vascular evaluation as needed  May consider radiofrequency rhizolysis or intraspinal procedures pending response to present treatment and F/U evaluation . We will avoid considering such treatment at this time  Patient to call Pain Management Center should patient have concerns  prior to scheduled return appointment.

## 2015-07-24 NOTE — Progress Notes (Signed)
Safety precautions to be maintained throughout the outpatient stay will include: orient to surroundings, keep bed in low position, maintain call bell within reach at all times, provide assistance with transfer out of bed and ambulation.  

## 2015-07-24 NOTE — Patient Instructions (Addendum)
PLAN   Continue present medication Zanaflex Topamax and hydrocodone acetaminophen  F/U PCP Dr. Kary Kos for evaliation of  BP and general medical  condition  F/U surgical evaluation. Surgical evaluation of shoulder [facility] as discussed and as planned  F/U neurological evaluation. May consider pending follow-up evaluations  F/U vascular evaluation as needed  May consider radiofrequency rhizolysis or intraspinal procedures pending response to present treatment and F/U evaluation   Patient to call Pain Management Center should patient have concerns prior to scheduled return appointment.

## 2015-08-21 ENCOUNTER — Ambulatory Visit: Payer: Medicare Other | Attending: Pain Medicine | Admitting: Pain Medicine

## 2015-08-21 ENCOUNTER — Encounter: Payer: Self-pay | Admitting: Pain Medicine

## 2015-08-21 ENCOUNTER — Encounter: Payer: Medicare Other | Admitting: Pain Medicine

## 2015-08-21 VITALS — BP 139/64 | HR 60 | Temp 97.8°F | Resp 16 | Ht 65.0 in | Wt 270.0 lb

## 2015-08-21 DIAGNOSIS — M47814 Spondylosis without myelopathy or radiculopathy, thoracic region: Secondary | ICD-10-CM | POA: Diagnosis not present

## 2015-08-21 DIAGNOSIS — M4804 Spinal stenosis, thoracic region: Secondary | ICD-10-CM | POA: Insufficient documentation

## 2015-08-21 DIAGNOSIS — R51 Headache: Secondary | ICD-10-CM | POA: Diagnosis present

## 2015-08-21 DIAGNOSIS — M5030 Other cervical disc degeneration, unspecified cervical region: Secondary | ICD-10-CM | POA: Insufficient documentation

## 2015-08-21 DIAGNOSIS — M533 Sacrococcygeal disorders, not elsewhere classified: Secondary | ICD-10-CM | POA: Diagnosis not present

## 2015-08-21 DIAGNOSIS — M5134 Other intervertebral disc degeneration, thoracic region: Secondary | ICD-10-CM | POA: Insufficient documentation

## 2015-08-21 DIAGNOSIS — M2578 Osteophyte, vertebrae: Secondary | ICD-10-CM | POA: Insufficient documentation

## 2015-08-21 DIAGNOSIS — M542 Cervicalgia: Secondary | ICD-10-CM | POA: Diagnosis present

## 2015-08-21 DIAGNOSIS — M47816 Spondylosis without myelopathy or radiculopathy, lumbar region: Secondary | ICD-10-CM

## 2015-08-21 DIAGNOSIS — M5481 Occipital neuralgia: Secondary | ICD-10-CM | POA: Diagnosis not present

## 2015-08-21 DIAGNOSIS — M7060 Trochanteric bursitis, unspecified hip: Secondary | ICD-10-CM | POA: Insufficient documentation

## 2015-08-21 DIAGNOSIS — M5136 Other intervertebral disc degeneration, lumbar region: Secondary | ICD-10-CM | POA: Diagnosis not present

## 2015-08-21 DIAGNOSIS — M546 Pain in thoracic spine: Secondary | ICD-10-CM | POA: Diagnosis present

## 2015-08-21 MED ORDER — HYDROCODONE-ACETAMINOPHEN 5-325 MG PO TABS: ORAL_TABLET | ORAL | Status: DC

## 2015-08-21 MED ORDER — TIZANIDINE HCL 2 MG PO TABS: ORAL_TABLET | ORAL | Status: DC

## 2015-08-21 MED ORDER — TOPIRAMATE 25 MG PO TABS: ORAL_TABLET | ORAL | Status: DC

## 2015-08-21 NOTE — Progress Notes (Signed)
Subjective:    Patient ID: Jody Turner, female    DOB: Nov 06, 1960, 55 y.o.   MRN: [id number]  HPI The patient is a 55 year old female who returns to pain management for further evaluation and treatment of pain involving the neck headaches and Back upper and lower extremity regions. The patient has had significant pain involving the region of the lower extremities and underwent injection in the region of the greater trochanteric regions this week. The patient will follow-up with Dr. Christophe Louis following the injections. Patient also is scheduled to undergo further evaluation of shoulders with consideration being given to surgical intervention of the shoulders. Patient continues to have pain in the back of the neck radiating to the back of the head precipitating headaches and is with pain involving the shoulders entire back upper and lower extremity regions. The patient is undergone vascular procedures of the lower extremities at the present time predominant pain of the lower extremity involves the greater trochanteric region. We discussed patient's condition and will continue medications as prescribed consisting of Zanaflex Topamax and hydrocodone acetaminophen. We will remain available to consider patient for additional treatment pending follow-up evaluation      Review of Systems     Objective:   Physical Exam  There was tenderness to palpation of the splenius capitis and occipitalis musculature regions. Palpation of these regions reproduce mild to moderate discomfort. There was mild to moderate tenderness to palpation over the region of the cervical facet cervical paraspinal musculature region. Palpation of the acromioclavicular and glenohumeral joint regions reproduce moderate to moderately severe discomfort with limited range of motion of the shoulders noted. Tinel and Phalen's maneuver were without increased pain of significant degree. Patient appeared to be with slightly decreased grip  strength. Palpation of the thoracic facet thoracic paraspinal musculature region was attends to palpation with no crepitus of the thoracic region noted. Palpation over the lumbar paraspinal musculatures and lumbar facet region was attends to palpation of moderate degree with lateral bending and palpation of the lumbar facets reproducing moderate discomfort with moderate tenderness of the PSIS and PII S region and moderate to moderately severe tenderness of the greater trochanteric region and iliotibial band regions. Straight leg raise was tolerates approximately 20 without increased pain with dorsiflexion noted. There was negative clonus negative Homans. EHL strength was questionably decreased and no definite sensory deficit or dermatomal distribution detected. There was no excessive test palpation of the abdominal region and no costovertebral tenderness was noted      Assessment & Plan:    Degenerative disc disease thoracic spine Degenerative changes T8-T9 especially with disc osteophyte complex producing mild to moderate central now stenosis with disc osteophyte complex contacting with indentation of the ventral thecal sac  Bilateral occipital neuralgia  Sacroiliac joint dysfunction  Greater trochanteric bursitis  Degenerative disc disease cervical spine  Cervical facet syndrome  Degenerative disc disease lumbar spine  Lumbar facet syndrome      PLAN   Continue present medication Zanaflex Topamax and hydrocodone acetaminophen  F/U PCP Dr. Kary Kos for evaliation of  BP and general medical  condition  F/U surgical evaluation. Surgical evaluation of shoulder [facility] as discussed and as planned  F/U neurological evaluation. May consider pending follow-up evaluations  F/U Dr.C Tamala Julian as discussed  F/U vascular evaluation as needed  May consider radiofrequency rhizolysis or intraspinal procedures pending response to present treatment and F/U evaluation    Patient to call Pain Management Center should patient have concerns prior  to scheduled return appointment.

## 2015-08-21 NOTE — Patient Instructions (Signed)
PLAN   Continue present medication Zanaflex Topamax and hydrocodone acetaminophen  F/U PCP Dr. Kary Kos for evaliation of  BP and general medical  condition  F/U surgical evaluation. Surgical evaluation of shoulder [facility] as discussed and as planned  F/U neurological evaluation. May consider pending follow-up evaluations  F/U Dr.C Tamala Julian as discussed  F/U vascular evaluation as needed  May consider radiofrequency rhizolysis or intraspinal procedures pending response to present treatment and F/U evaluation   Patient to call Pain Management Center should patient have concerns prior to scheduled return appointment.

## 2015-08-21 NOTE — Progress Notes (Signed)
Medication refill today.  Safety precautions to be maintained throughout the outpatient stay will include: orient to surroundings, keep bed in low position, maintain call bell within reach at all times, provide assistance with transfer out of bed and ambulation.

## 2015-08-25 ENCOUNTER — Encounter: Payer: Medicare Other | Admitting: Pain Medicine

## 2015-09-03 ENCOUNTER — Encounter: Payer: Medicare Other | Admitting: Pain Medicine

## 2015-09-18 ENCOUNTER — Encounter: Payer: Medicare Other | Admitting: Pain Medicine

## 2015-10-06 DIAGNOSIS — M25512 Pain in left shoulder: Secondary | ICD-10-CM | POA: Insufficient documentation

## 2015-10-07 ENCOUNTER — Encounter: Payer: Medicare Other | Admitting: Pain Medicine

## 2015-10-14 ENCOUNTER — Ambulatory Visit: Payer: Medicare Other | Attending: Pain Medicine | Admitting: Pain Medicine

## 2015-10-14 ENCOUNTER — Encounter: Payer: Self-pay | Admitting: Pain Medicine

## 2015-10-14 VITALS — BP 135/73 | HR 76 | Temp 97.8°F | Resp 16 | Ht 65.5 in | Wt 270.0 lb

## 2015-10-14 DIAGNOSIS — M5481 Occipital neuralgia: Secondary | ICD-10-CM | POA: Insufficient documentation

## 2015-10-14 DIAGNOSIS — M5136 Other intervertebral disc degeneration, lumbar region: Secondary | ICD-10-CM | POA: Insufficient documentation

## 2015-10-14 DIAGNOSIS — M2578 Osteophyte, vertebrae: Secondary | ICD-10-CM | POA: Insufficient documentation

## 2015-10-14 DIAGNOSIS — M4804 Spinal stenosis, thoracic region: Secondary | ICD-10-CM | POA: Insufficient documentation

## 2015-10-14 DIAGNOSIS — M5134 Other intervertebral disc degeneration, thoracic region: Secondary | ICD-10-CM | POA: Insufficient documentation

## 2015-10-14 DIAGNOSIS — M7060 Trochanteric bursitis, unspecified hip: Secondary | ICD-10-CM | POA: Insufficient documentation

## 2015-10-14 DIAGNOSIS — M546 Pain in thoracic spine: Secondary | ICD-10-CM | POA: Diagnosis present

## 2015-10-14 DIAGNOSIS — M5030 Other cervical disc degeneration, unspecified cervical region: Secondary | ICD-10-CM | POA: Diagnosis not present

## 2015-10-14 DIAGNOSIS — R51 Headache: Secondary | ICD-10-CM | POA: Diagnosis not present

## 2015-10-14 DIAGNOSIS — M542 Cervicalgia: Secondary | ICD-10-CM | POA: Diagnosis present

## 2015-10-14 DIAGNOSIS — M47814 Spondylosis without myelopathy or radiculopathy, thoracic region: Secondary | ICD-10-CM | POA: Insufficient documentation

## 2015-10-14 DIAGNOSIS — M47816 Spondylosis without myelopathy or radiculopathy, lumbar region: Secondary | ICD-10-CM

## 2015-10-14 DIAGNOSIS — M533 Sacrococcygeal disorders, not elsewhere classified: Secondary | ICD-10-CM

## 2015-10-14 MED ORDER — HYDROCODONE-ACETAMINOPHEN 5-325 MG PO TABS: ORAL_TABLET | ORAL | Status: DC

## 2015-10-14 MED ORDER — TOPIRAMATE 25 MG PO TABS: ORAL_TABLET | ORAL | Status: DC

## 2015-10-14 MED ORDER — TIZANIDINE HCL 2 MG PO TABS
ORAL_TABLET | ORAL | Status: DC
Start: 2015-10-14 — End: 2015-11-13

## 2015-10-14 NOTE — Progress Notes (Signed)
Subjective:    Patient ID: Jody Turner, female    DOB: 09-20-1960, 55 y.o.   MRN: [id number]  HPI  The patient is a 55 year old female who returns to pain management for further evaluation and treatment of pain involving the neck entire back upper and lower extremity regions. The patient is scheduled to undergo further evaluation of the left shoulder at [facility]. The patient present times to undergo exercises of the shoulder and will the considered for injection of the shoulder at time of return appointment to [facility]. The patient states that the pain is aggravated by range of motion maneuvers of the upper extremity. 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 We will avoid interventional treatment of the shoulder. The patient is a pain of the lower back and lower extremity region is well. The patient's pain is aggravated by standing walking twisting turning maneuvers. We will continue medications as prescribed at this time consisting of Zanaflex Topamax and hydrocodone acetaminophen. We will avoid interventional treatment. The patient is to call pain management should they be change in condition prior to scheduled return appointment and we will consider modification of treatment regimen. Patient's lower back lower extremity pain is aggravated by standing walking twisting turning maneuvers. We will await patient undergo further evaluation of right shoulder and we'll consider interventional treatment as well as additional modifications of treatment regimen for patient's lower back lower extremity pain and pain of the cervical region and headaches as well to be needed.    Review of Systems     Objective:   Physical Exam   there was tenderness over the splenius capitis and occipitalis  muscles regions of mild to moderate degree. There was tenderness over the cervical facet cervical paraspinal musculature region a mild to moderate degree. The patient was with severely limited range of motion of the left shoulder compared to the right shoulder with tenderness of the acromioclavicular and glenohumeral joint region. Patient appeared to be decreased. There was tenderness along the region of the thoracic facet thoracic paraspinal musculature region with evidence of muscle spasm of moderate to moderately severe degree with no crepitus of the thoracic region noted. Palpation over the lumbar paraspinal must reason lumbar facet region was attends to palpation of mild to moderate degree. Lateral bending rotation extension and palpation of the lumbar facets reproduce moderate discomfort. The knees were tenderness to palpation in crepitus of the knees with negative anterior and posterior drawer signs without ballottement of the patella. Palpation over the PSIS and PII S region reproduced moderate to moderately severe discomfort. There was moderate tenderness along the greater trochanteric region and iliotibial band region. Straight leg raising was limited to approximately 20 without increased pain with dorsiflexion noted. Negative clonus negative Homans. Abdomen was nontender with no costovertebral tenderness noted    Assessment & Plan:     Degenerative disc disease thoracic spine Degenerative changes T8-T9 especially with disc osteophyte complex producing mild to moderate central now stenosis with disc osteophyte complex contacting with indentation of the ventral thecal sac  Bilateral occipital neuralgia  Sacroiliac joint dysfunction  Greater trochanteric bursitis  Degenerative disc disease cervical spine  Cervical facet syndrome  Degenerative disc disease lumbar spine   Lumbar facet syndrome       PLAN  Continue present medication Zanaflex Topamax and hydrocodone  acetaminophen  F/U PCP Dr. Kary Kos for evaliation of  BP and general medical  condition  F/U surgical evaluation. Surgical evaluation of shoulder [facility]   Physical therapy as prescribed by orthopedic surgeon [facility]  F/U neurological evaluation. May consider pending follow-up evaluation  F/U vascular evaluation as needed  May consider radiofrequency rhizolysis or intraspinal procedures pending response to present treatment and F/U evaluation . We will avoid such treatment at this time  Patient to call Pain Management Center should patient have concerns prior to scheduled return appointment.

## 2015-10-14 NOTE — Patient Instructions (Addendum)
Continue present medication Zanaflex Topamax and hydrocodone acetaminophen  F/U PCP Dr. Kary Kos for evaliation of  BP and general medical  condition  F/U surgical evaluation. Surgical evaluation of shoulder [facility]   Physical therapy as prescribed by orthopedic surgeon [facility]  F/U neurological evaluation. May consider pending follow-up evaluation  F/U vascular evaluation as needed  May consider radiofrequency rhizolysis or intraspinal procedures pending response to present treatment and F/U evaluation . We will avoid such treatment at this time  Patient to call Pain Management Center should patient have concerns prior to scheduled return appointment.Pain Management Discharge Instructions  General Discharge Instructions :  If you need to reach your doctor call: Monday-Friday 8:00 am - 4:00 pm at [phone] or toll free [phone].  After clinic hours [phone] to have operator reach doctor.  Bring all of your medication bottles to all your appointments in the pain clinic.  To cancel or reschedule your appointment with Pain Management please remember to call 24 hours in advance to avoid a fee.  Refer to the educational materials which you have been given on: General Risks, I had my Procedure. Discharge Instructions, Post Sedation.  Post Procedure Instructions:  The drugs you were given will stay in your system until tomorrow, so for the next 24 hours you should not drive, make any legal decisions or drink any alcoholic beverages.  You may eat anything you prefer, but it is better to start with liquids then soups and crackers, and gradually work up to solid foods.  Please notify your doctor immediately if you have any unusual bleeding, trouble breathing or pain that is not related to your normal pain.  Depending on the type of procedure that was done, some parts of your body may feel week and/or numb.  This usually clears up by  tonight or the next day.  Walk with the use of an assistive device or accompanied by an adult for the 24 hours.  You may use ice on the affected area for the first 24 hours.  Put ice in a Ziploc bag and cover with a towel and place against area 15 minutes on 15 minutes off.  You may switch to heat after 24 hours.

## 2015-10-14 NOTE — Progress Notes (Signed)
Safety precautions to be maintained throughout the outpatient stay will include: orient to surroundings, keep bed in low position, maintain call bell within reach at all times, provide assistance with transfer out of bed and ambulation.  Pt starts PT today for shoulder

## 2015-11-13 ENCOUNTER — Encounter: Payer: Self-pay | Admitting: Pain Medicine

## 2015-11-13 ENCOUNTER — Ambulatory Visit: Payer: Medicare Other | Attending: Pain Medicine | Admitting: Pain Medicine

## 2015-11-13 VITALS — BP 134/71 | HR 72 | Temp 98.0°F | Resp 18 | Ht 65.0 in | Wt 280.0 lb

## 2015-11-13 DIAGNOSIS — M533 Sacrococcygeal disorders, not elsewhere classified: Secondary | ICD-10-CM | POA: Diagnosis not present

## 2015-11-13 DIAGNOSIS — M545 Low back pain: Secondary | ICD-10-CM | POA: Diagnosis present

## 2015-11-13 DIAGNOSIS — M2578 Osteophyte, vertebrae: Secondary | ICD-10-CM | POA: Diagnosis not present

## 2015-11-13 DIAGNOSIS — M47814 Spondylosis without myelopathy or radiculopathy, thoracic region: Secondary | ICD-10-CM | POA: Diagnosis not present

## 2015-11-13 DIAGNOSIS — M25512 Pain in left shoulder: Secondary | ICD-10-CM | POA: Diagnosis present

## 2015-11-13 DIAGNOSIS — M7060 Trochanteric bursitis, unspecified hip: Secondary | ICD-10-CM

## 2015-11-13 DIAGNOSIS — M51369 Other intervertebral disc degeneration, lumbar region without mention of lumbar back pain or lower extremity pain: Secondary | ICD-10-CM

## 2015-11-13 DIAGNOSIS — M5481 Occipital neuralgia: Secondary | ICD-10-CM

## 2015-11-13 DIAGNOSIS — M5134 Other intervertebral disc degeneration, thoracic region: Secondary | ICD-10-CM | POA: Diagnosis not present

## 2015-11-13 DIAGNOSIS — M4804 Spinal stenosis, thoracic region: Secondary | ICD-10-CM | POA: Diagnosis not present

## 2015-11-13 DIAGNOSIS — M47816 Spondylosis without myelopathy or radiculopathy, lumbar region: Secondary | ICD-10-CM

## 2015-11-13 DIAGNOSIS — M5030 Other cervical disc degeneration, unspecified cervical region: Secondary | ICD-10-CM

## 2015-11-13 DIAGNOSIS — M5136 Other intervertebral disc degeneration, lumbar region: Secondary | ICD-10-CM

## 2015-11-13 DIAGNOSIS — M25511 Pain in right shoulder: Secondary | ICD-10-CM | POA: Diagnosis present

## 2015-11-13 MED ORDER — TOPIRAMATE 25 MG PO TABS: ORAL_TABLET | ORAL | Status: DC

## 2015-11-13 MED ORDER — HYDROCODONE-ACETAMINOPHEN 5-325 MG PO TABS: ORAL_TABLET | ORAL | Status: DC

## 2015-11-13 MED ORDER — TIZANIDINE HCL 2 MG PO TABS
ORAL_TABLET | ORAL | Status: DC
Start: 2015-11-13 — End: 2016-01-13

## 2015-11-13 NOTE — Progress Notes (Signed)
   Subjective:    Patient ID: Jody Turner, female    DOB: 19-Jun-1961, 55 y.o.   MRN: [id number]  HPI  The patient is a 55 year old female who returns to pain management further evaluation and treatment of pain involving the lower back lower extremity region and shoulders. The patient states that the lower back lower extremity pain is fairly well-controlled at this time. Patient will follow-up with orthopedics for further evaluation and treatment of pain involving the shoulder. We discussed patient's condition and patient missed pain occurring the lower back region radiating to the buttocks on the left as well as on the right and becoming more intense as patient spends more time on the feet. Patient denies any trauma change in events of daily living the cost change in symptomatology. Patient also will undergo follow-up evaluation with her surgeon status pose interventional treatment for lower extremity varicose veins. We will consider modifications of treatment regimen pending response to treatment and follow-up evaluation. All agreed to suggested treatment plan     Review of Systems     Objective:   Physical Exam   There was tenderness to palpation of the splenius capitis and occipitalis musculature regions palpation which reproduces pain of mild-to-moderate degree with mild to moderate tenderness over the cervical facet cervical paraspinal musculature region. Palpation of the acromioclavicular and glenohumeral joint region was of increased pain of moderate to moderately severe degree with decreased range of motion of the acromioclavicular and glenohumeral joint region. There was questionably decreased grip strength. Palpation of the trapezius levator scapula rhomboid musculature region reproduces moderate discomfort. No crepitus of the thoracic region was noted. Palpation over the lumbar paraspinal musculatures and lumbar facet region was of increased pain with lateral bending rotation extension  and palpation of the lumbar facets reproducing moderate discomfort. Palpation over the PSIS and PII S region associated with moderate to moderately severe discomfort. Straight leg raising was tolerates +30 without increased pain with dorsiflexion noted. EHL strength appeared to be decreased and no definite sensory deficit or dermatomal dystrophy detected. There was negative clonus negative Homans. Abdomen protuberant without excessive tends to palpation of vertebral tenderness noted     Assessment & Plan:     Degenerative disc disease thoracic spine Degenerative changes T8-T9 especially with disc osteophyte complex producing mild to moderate central now stenosis with disc osteophyte complex contacting with indentation of the ventral thecal sac  Bilateral occipital neuralgia  Sacroiliac joint dysfunction  Greater trochanteric bursitis  Degenerative disc disease cervical spine  Cervical facet syndrome  Degenerative disc disease lumbar spine  Lumbar facet syndrome      PLAN    Continue present medication Zanaflex Topamax and hydrocodone acetaminophen  F/U PCP Dr. Kary Kos for evaliation of  BP and general medical  condition  F/U surgical evaluation. Surgical evaluation of shoulder [facility] with Dr.  Marlaine Hind  Physical therapy as prescribed by orthopedic surgeon [facility]  F/U neurological evaluation. May consider pending follow-up evaluation  F/U vascular evaluation as discussed  May consider radiofrequency rhizolysis or intraspinal procedures pending response to present treatment and F/U evaluation . We will avoid such treatment at this time  Patient to call Pain Management Center should patient have concerns prior to scheduled return appointment.

## 2015-11-13 NOTE — Progress Notes (Signed)
Safety precautions to be maintained throughout the outpatient stay will include: orient to surroundings, keep bed in low position, maintain call bell within reach at all times, provide assistance with transfer out of bed and ambulation.  

## 2015-11-13 NOTE — Patient Instructions (Addendum)
c 

## 2015-11-19 LAB — TOXASSURE SELECT 13 (MW), URINE: PDF: 0

## 2016-01-13 ENCOUNTER — Ambulatory Visit: Payer: Medicare Other | Attending: Pain Medicine | Admitting: Pain Medicine

## 2016-01-13 ENCOUNTER — Encounter: Payer: Self-pay | Admitting: Pain Medicine

## 2016-01-13 VITALS — BP 150/70 | HR 70 | Temp 95.7°F | Resp 16 | Ht 66.0 in | Wt 280.0 lb

## 2016-01-13 DIAGNOSIS — M47814 Spondylosis without myelopathy or radiculopathy, thoracic region: Secondary | ICD-10-CM | POA: Insufficient documentation

## 2016-01-13 DIAGNOSIS — M5030 Other cervical disc degeneration, unspecified cervical region: Secondary | ICD-10-CM | POA: Insufficient documentation

## 2016-01-13 DIAGNOSIS — R51 Headache: Secondary | ICD-10-CM | POA: Diagnosis present

## 2016-01-13 DIAGNOSIS — M5136 Other intervertebral disc degeneration, lumbar region: Secondary | ICD-10-CM | POA: Insufficient documentation

## 2016-01-13 DIAGNOSIS — M7060 Trochanteric bursitis, unspecified hip: Secondary | ICD-10-CM

## 2016-01-13 DIAGNOSIS — M4806 Spinal stenosis, lumbar region: Secondary | ICD-10-CM | POA: Insufficient documentation

## 2016-01-13 DIAGNOSIS — M170 Bilateral primary osteoarthritis of knee: Secondary | ICD-10-CM | POA: Insufficient documentation

## 2016-01-13 DIAGNOSIS — M19012 Primary osteoarthritis, left shoulder: Secondary | ICD-10-CM | POA: Insufficient documentation

## 2016-01-13 DIAGNOSIS — M5481 Occipital neuralgia: Secondary | ICD-10-CM | POA: Diagnosis not present

## 2016-01-13 DIAGNOSIS — M19011 Primary osteoarthritis, right shoulder: Secondary | ICD-10-CM | POA: Insufficient documentation

## 2016-01-13 DIAGNOSIS — M5134 Other intervertebral disc degeneration, thoracic region: Secondary | ICD-10-CM | POA: Diagnosis not present

## 2016-01-13 DIAGNOSIS — M533 Sacrococcygeal disorders, not elsewhere classified: Secondary | ICD-10-CM | POA: Diagnosis not present

## 2016-01-13 DIAGNOSIS — M2578 Osteophyte, vertebrae: Secondary | ICD-10-CM | POA: Insufficient documentation

## 2016-01-13 DIAGNOSIS — M542 Cervicalgia: Secondary | ICD-10-CM | POA: Diagnosis present

## 2016-01-13 DIAGNOSIS — M47816 Spondylosis without myelopathy or radiculopathy, lumbar region: Secondary | ICD-10-CM

## 2016-01-13 DIAGNOSIS — M546 Pain in thoracic spine: Secondary | ICD-10-CM | POA: Diagnosis present

## 2016-01-13 MED ORDER — HYDROCODONE-ACETAMINOPHEN 5-325 MG PO TABS: ORAL_TABLET | ORAL | Status: DC

## 2016-01-13 MED ORDER — TOPIRAMATE 25 MG PO TABS: ORAL_TABLET | ORAL | Status: DC

## 2016-01-13 MED ORDER — TIZANIDINE HCL 2 MG PO TABS: ORAL_TABLET | ORAL | Status: DC

## 2016-01-13 NOTE — Progress Notes (Signed)
Subjective:    Patient ID: Jody Turner, female    DOB: 11-06-1960, 55 y.o.   MRN: [id number]  HPI  The patient is a 55 year old female who returns to pain management for further evaluation and treatment of pain involving the region of the neck associated with headaches as well as pain involving the upper mid lower back and lower extremity regions. The patient also will undergo further evaluation of shoulder at [place] as planned. Pain involving the knees and will follow-up with orthopedic surgeon regarding pain of the knees as well. We will remain available to consider patient for modification of treatment regimen pending response to treatment and follow-up evaluation. At the present time patient will continue Zanaflex Topamax and hydrocodone acetaminophen as prescribed and we will remain available to consider patient for modification of treatment regimen should there be significant change in condition. All agreed to suggested treatment plan  Review of Systems     Objective:   Physical Exam  Palpation over the splenius capitis and occipitalis regions reproduce moderate discomfort. There was moderate tenderness over the cervical facet cervical paraspinal musculature region. Palpation over the region of the acromioclavicular and glenohumeral joint regions reproduce mild to moderate discomfort and there was mild to moderate tenderness over the thoracic facet thoracic paraspinal musculature region without crepitus of the thoracic region noted. Patient appeared to be with bilaterally equal grip strength with Tinel and Phalen's maneuver reproducing minimal discomfort. Patient appeared to be with unremarkable Spurling's maneuver. Palpation over the region of the lumbar paraspinal must reason lumbar facet region was of increased pain with extension and palpation of the lumbar facets reproducing pain of moderate degree. There was moderate tenderness to palpation over the PSIS and PII S region as  well as the gluteal and piriformis musculature region on the left as well as on the right. Straight leg raise was tolerates approximately 20 without a definite increase of pain with dorsiflexion noted. There was moderate tenderness of the knees with increased pain of the knees with range of motion maneuvers of the knees without increased warmth and erythema in the region of the knees. There appeared to be no increased laxity of the joints. Crepitus of the knees were noted and no ballottement of the patella was noted. There appeared to be negative anterior and posterior drawer signs of the knees. EHL strength of the lower extremities appeared to be equal. There was negative clonus negative Homans. 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 Kary Kos for evaliation of  BP and general medical  condition  F/U surgical evaluation. Surgical evaluation of shoulder at [facility] Orthopedic department  Physical therapy as prescribed by orthopedic surgeon [facility]  F/U surgical evaluation of shoulder and knees as discussed  F/U neurological evaluation. May consider PNCV/EMG studies and other studies pending follow-up evaluation  F/U vascular evaluation as  needed  May  consider radiofrequency rhizolysis or intraspinal procedures pending response to present treatment and F/U evaluation . We will avoid such treatment at this time  Patient to call Pain Management Center should patient have concerns prior to scheduled return appointment.

## 2016-01-13 NOTE — Patient Instructions (Addendum)
Continue present medication Zanaflex Topamax and hydrocodone acetaminophen  F/U PCP Dr. Kary Kos for evaliation of  BP and general medical  condition  F/U surgical evaluation. Surgical evaluation of shoulder at [facility] Orthopedic department  Physical therapy as prescribed by orthopedic surgeon [facility]  F/U surgical evaluation of shoulder and knees as discussed  F/U neurological evaluation. May consider PNCV/EMG studies and other studies pending follow-up evaluation  F/U vascular evaluation as needed  May consider radiofrequency rhizolysis or intraspinal procedures pending response to present treatment and F/U evaluation . We will avoid such treatment at this time  Patient to call Pain Management Center should patient have concerns prior to scheduled return appointment.Pain Management Discharge Instructions  General Discharge Instructions :  If you need to reach your doctor call: Monday-Friday 8:00 am - 4:00 pm at [phone] or toll free [phone].  After clinic hours [phone] to have operator reach doctor.  Bring all of your medication bottles to all your appointments in the pain clinic.  To cancel or reschedule your appointment with Pain Management please remember to call 24 hours in advance to avoid a fee.  Refer to the educational materials which you have been given on: General Risks, I had my Procedure. Discharge Instructions, Post Sedation.  Post Procedure Instructions:  The drugs you were given will stay in your system until tomorrow, so for the next 24 hours you should not drive, make any legal decisions or drink any alcoholic beverages.  You may eat anything you prefer, but it is better to start with liquids then soups and crackers, and gradually work up to solid foods.  Please notify your doctor immediately if you have any unusual bleeding, trouble breathing or pain that is not related to your normal pain.  Depending  on the type of procedure that was done, some parts of your body may feel week and/or numb.  This usually clears up by tonight or the next day.  Walk with the use of an assistive device or accompanied by an adult for the 24 hours.  You may use ice on the affected area for the first 24 hours.  Put ice in a Ziploc bag and cover with a towel and place against area 15 minutes on 15 minutes off.  You may switch to heat after 24 hours.

## 2016-01-13 NOTE — Progress Notes (Signed)
Safety precautions to be maintained throughout the outpatient stay will include: orient to surroundings, keep bed in low position, maintain call bell within reach at all times, provide assistance with transfer out of bed and ambulation.  

## 2016-02-19 ENCOUNTER — Ambulatory Visit: Payer: Medicare Other | Admitting: Pain Medicine

## 2016-02-23 ENCOUNTER — Other Ambulatory Visit: Payer: Self-pay | Admitting: Family Medicine

## 2016-02-23 DIAGNOSIS — Z1231 Encounter for screening mammogram for malignant neoplasm of breast: Secondary | ICD-10-CM

## 2016-03-01 ENCOUNTER — Ambulatory Visit: Payer: Medicare Other | Admitting: Pain Medicine

## 2016-03-03 ENCOUNTER — Ambulatory Visit
Admission: RE | Admit: 2016-03-03 | Discharge: 2016-03-03 | Disposition: A | Discharge status: Home / Self Care | Payer: Medicare Other | Source: Ambulatory Visit | Attending: Family Medicine | Admitting: Family Medicine

## 2016-03-03 DIAGNOSIS — Z1231 Encounter for screening mammogram for malignant neoplasm of breast: Secondary | ICD-10-CM

## 2016-03-08 ENCOUNTER — Ambulatory Visit: Payer: Medicare Other | Attending: Pain Medicine | Admitting: Pain Medicine

## 2016-03-08 ENCOUNTER — Encounter: Payer: Self-pay | Admitting: Pain Medicine

## 2016-03-08 VITALS — BP 119/67 | HR 73 | Temp 98.2°F | Resp 16 | Ht 66.0 in | Wt 280.0 lb

## 2016-03-08 DIAGNOSIS — M47814 Spondylosis without myelopathy or radiculopathy, thoracic region: Secondary | ICD-10-CM | POA: Insufficient documentation

## 2016-03-08 DIAGNOSIS — M170 Bilateral primary osteoarthritis of knee: Secondary | ICD-10-CM | POA: Diagnosis not present

## 2016-03-08 DIAGNOSIS — M19011 Primary osteoarthritis, right shoulder: Secondary | ICD-10-CM | POA: Insufficient documentation

## 2016-03-08 DIAGNOSIS — M2578 Osteophyte, vertebrae: Secondary | ICD-10-CM | POA: Insufficient documentation

## 2016-03-08 DIAGNOSIS — M4804 Spinal stenosis, thoracic region: Secondary | ICD-10-CM | POA: Diagnosis not present

## 2016-03-08 DIAGNOSIS — M7060 Trochanteric bursitis, unspecified hip: Secondary | ICD-10-CM | POA: Insufficient documentation

## 2016-03-08 DIAGNOSIS — M533 Sacrococcygeal disorders, not elsewhere classified: Secondary | ICD-10-CM | POA: Insufficient documentation

## 2016-03-08 DIAGNOSIS — M5481 Occipital neuralgia: Secondary | ICD-10-CM | POA: Diagnosis not present

## 2016-03-08 DIAGNOSIS — M47816 Spondylosis without myelopathy or radiculopathy, lumbar region: Secondary | ICD-10-CM

## 2016-03-08 DIAGNOSIS — M5030 Other cervical disc degeneration, unspecified cervical region: Secondary | ICD-10-CM | POA: Insufficient documentation

## 2016-03-08 DIAGNOSIS — M542 Cervicalgia: Secondary | ICD-10-CM | POA: Diagnosis present

## 2016-03-08 DIAGNOSIS — M19012 Primary osteoarthritis, left shoulder: Secondary | ICD-10-CM | POA: Insufficient documentation

## 2016-03-08 DIAGNOSIS — M5134 Other intervertebral disc degeneration, thoracic region: Secondary | ICD-10-CM | POA: Insufficient documentation

## 2016-03-08 DIAGNOSIS — R51 Headache: Secondary | ICD-10-CM | POA: Diagnosis present

## 2016-03-08 DIAGNOSIS — M5136 Other intervertebral disc degeneration, lumbar region: Secondary | ICD-10-CM | POA: Diagnosis not present

## 2016-03-08 MED ORDER — HYDROCODONE-ACETAMINOPHEN 5-325 MG PO TABS: ORAL_TABLET | ORAL | 0 refills | Status: AC

## 2016-03-08 MED ORDER — TIZANIDINE HCL 2 MG PO TABS: ORAL_TABLET | ORAL | 0 refills | Status: DC

## 2016-03-08 MED ORDER — TOPIRAMATE 25 MG PO TABS: ORAL_TABLET | ORAL | 0 refills | Status: DC

## 2016-03-08 NOTE — Progress Notes (Signed)
Patient here for medication management Safety precautions to be maintained throughout the outpatient stay will include: orient to surroundings, keep bed in low position, maintain call bell within reach at all times, provide assistance with transfer out of bed and ambulation.  

## 2016-03-08 NOTE — Progress Notes (Signed)
    The patient is a 55 year old female who returns to pain management for further evaluation and treatment of pain involving the neck headaches into back upper and lower extremity region. The patient is undergone recent procedure of the lower extremities for treatment of pain of the lower extremity. The patient also is considering additional treatment of shoulder as well as knees. The patient states that she is considering injection of knees as well as surgical evaluation of shoulder. The present time patient states the pain is well-controlled at times with the tizanidine Topamax and hydrocodone acetaminophen. We remain available to consider modification of treatment as discussed with patient. The patient denies any trauma change in events of daily living the call significant change in symptomatology. A manual should she wish to modify present treatment regimen. All agreed to suggested treatment plan.    Physical examination  There was tenderness to palpation of the paraspinal muscular treat the cervical and cervical facet region a mild to moderate degree with mild to moderate tenderness of the splenius capitis and occipitalis region. No new masses of the head and neck were noted. Palpation of the acromioclavicular and glenohumeral joint region reproduced moderate to moderately severe discomfort and patient had difficulty attempting to perform drop test. Tinel and Phalen's maneuver reproduce moderate discomfort. Grip strength appeared to be slightly decreased. Palpation of the thoracic region was with no crepitus of the thoracic region noted. Palpation over the lumbar region was with moderate to moderately severe discomfort with palpation over the PSIS and PII S region reproducing severe pain. Palpation of the greater trochanteric region iliotibial band region reproduced moderately severe discomfort as well. Straight leg raising was tolerates approximately 20 without increased pain with dorsiflexion noted.  There appeared to be negative clonus negative Homans with no sensory deficit or dermatomal dystrophy detected. Knees were tenderness to palpation of moderate degree with crepitus of the knees and increased pain with range of motion maneuvers of the knees. There appeared to be negative anterior and posterior drawer signs without ballottement of the patella. No increased warmth erythema of the knees were noted. Her dermatomal distribution of the lower extremities noted. Abdomen nontender with no costovertebral tenderness noted    Assessment  Degenerative disc disease lumbar spine  Degenerative disc disease thoracic spine Degenerative changes T8-T9 especially with disc osteophyte complex producing mild to moderate central now stenosis with disc osteophyte complex contacting with indentation of the ventral thecal sac  Bilateral occipital neuralgia  Sacroiliac joint dysfunction  Greater trochanteric bursitis  Degenerative disc disease cervical spine  Cervical facet syndrome  Degenerative joint disease (shoulders, knees)     PLAN   Continue present medications Zanaflex Topamax and hydrocodone acetaminophen  F/U PCP Dr. Kary Kos for evaliation of  BP and general medical  condition  F/U surgical evaluation. Follow-up surgical evaluation of shoulder at [facility] Orthopedic department  Physical therapy as prescribed by orthopedic surgeon [facility]  F/U surgical evaluation of knees as discussed  F/U neurological evaluation. May consider PNCV/EMG studies and other studies pending follow-up evaluation  F/U vascular evaluation as discussed status post procedure of veins recently  May consider radiofrequency rhizolysis or intraspinal procedures pending response to present treatment and F/U evaluation . We will avoid such treatment at this time  Patient to call Pain Management Center should patient have concerns prior to scheduled return  appointment.

## 2016-03-08 NOTE — Patient Instructions (Addendum)
PLAN   Continue present medications Zanaflex Topamax and hydrocodone acetaminophen  F/U PCP Dr. Kary Kos for evaliation of  BP and general medical  condition  F/U surgical evaluation. Follow-up surgical evaluation of shoulder at [facility] Orthopedic department  Physical therapy as prescribed by orthopedic surgeon [facility]  F/U surgical evaluation of knees as discussed  F/U neurological evaluation. May consider PNCV/EMG studies and other studies pending follow-up evaluation  F/U vascular evaluation as discussed status post procedure of veins recently  May consider radiofrequency rhizolysis or intraspinal procedures pending response to present treatment and F/U evaluation . We will avoid such treatment at this time  Patient to call Pain Management Center should patient have concerns prior to scheduled return appointment.

## 2016-03-09 ENCOUNTER — Ambulatory Visit (INDEPENDENT_AMBULATORY_CARE_PROVIDER_SITE_OTHER): Payer: Medicare Other | Admitting: Podiatry

## 2016-03-09 ENCOUNTER — Encounter: Payer: Self-pay | Admitting: Podiatry

## 2016-03-09 ENCOUNTER — Ambulatory Visit (INDEPENDENT_AMBULATORY_CARE_PROVIDER_SITE_OTHER): Payer: Medicare Other

## 2016-03-09 DIAGNOSIS — G5761 Lesion of plantar nerve, right lower limb: Secondary | ICD-10-CM | POA: Diagnosis not present

## 2016-03-09 DIAGNOSIS — M722 Plantar fascial fibromatosis: Secondary | ICD-10-CM | POA: Diagnosis not present

## 2016-03-09 DIAGNOSIS — D3610 Benign neoplasm of peripheral nerves and autonomic nervous system, unspecified: Secondary | ICD-10-CM

## 2016-03-09 DIAGNOSIS — R52 Pain, unspecified: Secondary | ICD-10-CM

## 2016-03-09 DIAGNOSIS — M7751 Other enthesopathy of right foot: Secondary | ICD-10-CM

## 2016-03-09 DIAGNOSIS — M779 Enthesopathy, unspecified: Secondary | ICD-10-CM

## 2016-03-09 DIAGNOSIS — M778 Other enthesopathies, not elsewhere classified: Secondary | ICD-10-CM

## 2016-03-09 MED ORDER — METHYLPREDNISOLONE 4 MG PO TBPK: ORAL_TABLET | ORAL | 0 refills | Status: DC

## 2016-03-09 NOTE — Patient Instructions (Signed)

## 2016-03-14 NOTE — Progress Notes (Signed)
Subjective: 55-year-old female presents the office they for bilateral foot and ankle pain. She states that she will the bottoms of both her heels hurt and she points the sinus tarsi on the right foot as well as the second interspace and left foot which she is getting pain. On the left foot she is a numbness and tingling to her second third toes. The majority of her pain on both views with walking or when she first gets up. She denies any recent injury or trauma. This has been ongoing for quite some time. No recent injury or trauma. No recent treatment. Denies any systemic complaints such as fevers, chills, nausea, vomiting. No acute changes since last appointment, and no other complaints at this time.   Objective: AAO x3, NAD DP/PT pulses palpable bilaterally, CRT less than 3 seconds Tenderness to palpation along the plantar medial tubercle of the calcaneus at the insertion of plantar fascia on the left and right foot. There is no pain along the course of the plantar fascia within the arch of the foot. Plantar fascia appears to be intact. There is no pain with lateral compression of the calcaneus or pain with vibratory sensation. There is no pain along the course or insertion of the achilles tendon. There is tenderness on lateral aspect of the sinus tarsi on the right foot there is mild discomfort with subtalar joint range of motion which is somewhat limited. No pain with ankle joint range of motion. On the left foot there is also tenderness in the second interspace Abdomen are identified. Subjective she is a numbness and tingling to his second third toes as well. No other areas of tenderness to bilateral lower extremities. No open lesions or pre-ulcerative lesions.  No pain with calf compression, swelling, warmth, erythema  Assessment: Bilateral heel pain, likely plantar fasciitis; right subtalar joint capsulitis; left second interspace likely neuroma  Plan: -All treatment options discussed with the  patient including all alternatives, risks, complications.  -X-rays were obtained and reviewed with the patient. Prior surgery on the right foot. No evidence of acute fracture or stress fracture didn't read this time. -Patient elects to proceed with steroid injection into the left and right heel. Under sterile skin preparation, a total of 2.5cc of kenalog 10, 0.5% Marcaine plain, and 2% lidocaine plain were infiltrated into the symptomatic area without complication. A band-aid was applied. Patient tolerated the injection well without complication. Post-injection care with discussed with the patient. Discussed with the patient to ice the area over the next couple of days to help prevent a steroid flare.  -Steroid injection is performed the right subtalar joint on O the sinus tarsi with dexamethasone and phosphate and melanocytic. Also injection performed the left second interspace of Kenalog and local anesthetic. All injections were completed without complications. Post injection care was discussed. -Plantar fascial braces and 2. -Ice -Stretching -Discussed shoe gear modifications and orthotics. -Patient encouraged to call the office with any questions, concerns, change in symptoms.   Celesta Gentile, DPM

## 2016-03-30 ENCOUNTER — Telehealth: Payer: Self-pay | Admitting: *Deleted

## 2016-04-01 ENCOUNTER — Ambulatory Visit (INDEPENDENT_AMBULATORY_CARE_PROVIDER_SITE_OTHER): Payer: Medicare Other | Admitting: Podiatry

## 2016-04-01 ENCOUNTER — Encounter: Payer: Self-pay | Admitting: Podiatry

## 2016-04-01 DIAGNOSIS — S93402S Sprain of unspecified ligament of left ankle, sequela: Secondary | ICD-10-CM | POA: Diagnosis not present

## 2016-04-01 DIAGNOSIS — M779 Enthesopathy, unspecified: Secondary | ICD-10-CM | POA: Diagnosis not present

## 2016-04-01 DIAGNOSIS — D3610 Benign neoplasm of peripheral nerves and autonomic nervous system, unspecified: Secondary | ICD-10-CM

## 2016-04-01 DIAGNOSIS — M79673 Pain in unspecified foot: Secondary | ICD-10-CM

## 2016-04-01 DIAGNOSIS — G5762 Lesion of plantar nerve, left lower limb: Secondary | ICD-10-CM | POA: Diagnosis not present

## 2016-04-01 DIAGNOSIS — B351 Tinea unguium: Secondary | ICD-10-CM

## 2016-04-08 NOTE — Progress Notes (Signed)
Subjective: Degenerative-year-old female presents the also for follow-up of ulceration bilateral foot pain. She states that overall she is doing much better but she is still gaining burning obtained the left foot on the neuroma as well as she is getting some pain on the inside aspect of the left ankle. She says the right foot is doing much better. Denies any recent injury or trauma since last appointment. Denies any systemic complaints such as fevers, chills, nausea, vomiting. No acute changes since last appointment, and no other complaints at this time.   Objective: AAO x3, NAD DP/PT pulses palpable bilaterally, CRT less than 3 seconds There is tenderness to left foot along the second interspace. Upon palpation interspace there is a Doren Custard is intended to the second and third toes. No definitive palpable neuromas of the foot at this time. There is no area pinpoint tenderness or pain the vibratory sensation. There is some tenderness on the medial ankle along the course of the deltoid ligament. Ankle, subtalar joint range of motion intact. There is no overlying edema, erythema, increase in warmth. There is greatly improved tenderness to the right foot at this time. There is minimal tenderness palpation along the sinus tarsi. No pain with subtalar joint range of motion. No edema, erythema, increase in warmth to bilateral lower extremities. Nails are, discolored, dystrophic, hypertrophic. No tenderness nails insertion of fibrinous or drainage  No open lesions or pre-ulcerative lesions.  No pain with calf compression, swelling, warmth, erythema  Assessment: Left foot neuroma, tendinitis/sprain; onychodystrophy, possible onychomycosis  Plan: -All treatment options discussed with the patient including all alternatives, risks, complications.  -At this time discussed a second steroid injection into the symptomatic area in the left second interspace and she wishes to proceed. Under sterile conditions a mixture  of Kenalog and local anesthetic was infiltrated without complications. Post injection care was discussed. -Dispensed Tri-Lock ankle brace for the left ankle given the pain. -Range of motion, rehabilitation exercises discussed -Discussed shoe gear modifications as well as custom molded orthotics -Compound cream for anti-inflammatory -Nails and for culture to [facility] -Patient encouraged to call the office with any questions, concerns, change in symptoms.   Celesta Gentile, DPM

## 2016-04-13 ENCOUNTER — Encounter: Payer: Medicare Other | Admitting: Pain Medicine

## 2016-04-29 ENCOUNTER — Encounter: Payer: Self-pay | Admitting: Podiatry

## 2016-04-29 ENCOUNTER — Ambulatory Visit (INDEPENDENT_AMBULATORY_CARE_PROVIDER_SITE_OTHER): Payer: Medicare Other | Admitting: Podiatry

## 2016-04-29 DIAGNOSIS — B351 Tinea unguium: Secondary | ICD-10-CM | POA: Diagnosis not present

## 2016-04-29 NOTE — Patient Instructions (Signed)
You can try to get fungi-nail over the counter to help with the nail fungus.

## 2016-05-01 DIAGNOSIS — B351 Tinea unguium: Secondary | ICD-10-CM | POA: Insufficient documentation

## 2016-05-01 NOTE — Progress Notes (Addendum)
Subjective: Patient was in so discussed nail culture results. She denies any acute changes since last appointment she has no new concerns today. Overall she states her feet are doing better and she wishes to have any further steroid injections today. Denies any systemic complaints such as fevers, chills, nausea, vomiting. No acute changes since last appointment, and no other complaints at this time.   Objective: AAO x3, NAD DP/PT pulses palpable bilaterally, CRT less than 3 seconds Nails are dystrophic, hypertrophic and mildly discolored. There is no tenderness to palpation of the nail there is no surrounding redness or drainage. There is no clinical signs of infection.  No open lesions or pre-ulcerative lesions.  No pain with calf compression, swelling, warmth, erythema  Assessment: Onychomycosis   Plan: -All treatment options discussed with the patient including all alternatives, risks, complications.  -Nail culture results were discussed the patient. I had a long discussion the patient requested treatment options including laser therapy, topical treatment, oral therapy. She was on oral therapy we will go ahead and start topical. I ordered a topical antifungal through compound pharmacy. -Patient encouraged to call the office with any questions, concerns, change in symptoms.   Celesta Gentile, DPM

## 2016-05-26 ENCOUNTER — Telehealth: Payer: Self-pay | Admitting: *Deleted

## 2016-05-26 NOTE — Telephone Encounter (Signed)
Pt requested refill of Diclofenac. I reviewed clinicals and [facility] doctors have not written for Diclofenac. Informed pt of my findings and she states maybe another doctor ordered.

## 2016-07-28 ENCOUNTER — Other Ambulatory Visit: Payer: Self-pay | Admitting: Pain Medicine

## 2016-09-11 ENCOUNTER — Other Ambulatory Visit: Payer: Self-pay | Admitting: Podiatry

## 2016-10-04 ENCOUNTER — Emergency Department (HOSPITAL_COMMUNITY)
Admission: EM | Admit: 2016-10-04 | Discharge: 2016-10-04 | Disposition: A | Discharge status: Home / Self Care | Payer: Medicare Other | Attending: Emergency Medicine | Admitting: Emergency Medicine

## 2016-10-04 ENCOUNTER — Encounter (HOSPITAL_COMMUNITY): Payer: Self-pay | Admitting: Emergency Medicine

## 2016-10-04 DIAGNOSIS — G500 Trigeminal neuralgia: Secondary | ICD-10-CM | POA: Diagnosis not present

## 2016-10-04 DIAGNOSIS — Z79899 Other long term (current) drug therapy: Secondary | ICD-10-CM | POA: Diagnosis not present

## 2016-10-04 DIAGNOSIS — I10 Essential (primary) hypertension: Secondary | ICD-10-CM | POA: Diagnosis not present

## 2016-10-04 DIAGNOSIS — Z85828 Personal history of other malignant neoplasm of skin: Secondary | ICD-10-CM | POA: Diagnosis not present

## 2016-10-04 DIAGNOSIS — R51 Headache: Secondary | ICD-10-CM | POA: Diagnosis present

## 2016-10-04 LAB — I-STAT CHEM 8, ED
BUN: 18 mg/dL (ref 6–20)
CALCIUM ION: 1.21 mmol/L (ref 1.15–1.40)
CHLORIDE: 105 mmol/L (ref 101–111)
Creatinine, Ser: 0.9 mg/dL (ref 0.44–1.00)
Glucose, Bld: 102 mg/dL — ABNORMAL HIGH (ref 65–99)
HEMATOCRIT: 40 % (ref 36.0–46.0)
Hemoglobin: 13.6 g/dL (ref 12.0–15.0)
Potassium: 4.1 mmol/L (ref 3.5–5.1)
SODIUM: 139 mmol/L (ref 135–145)
TCO2: 26 mmol/L (ref 0–100)

## 2016-10-04 MED ORDER — CARBAMAZEPINE ER 100 MG PO TB12: 100.0000 mg | ORAL_TABLET | Freq: Two times a day (BID) | ORAL | 0 refills | Status: DC
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
## 2016-10-04 NOTE — ED Notes (Signed)
Pt verbalized understanding of d/c instructions and has no further questions. Pt stable and NAD. Pt to follow up with neuro.

## 2016-10-04 NOTE — ED Triage Notes (Signed)
Pt reports face pain and swelling as well as "uncontrollable teeth chattering and lip quivering" x1.5 weeks. Pts speech clear, face symmetrical, alert and oriented, steady gait when ambulating into triage, no neuro deficits presents. Pt denies dizziness or any memory loss.

## 2016-10-04 NOTE — ED Notes (Signed)
Dr. Maryan Rued made aware of patient, advised this RN to order istat chem 8 until MD can assess and exam patient for further order.

## 2016-10-06 ENCOUNTER — Encounter: Payer: Self-pay | Admitting: Neurology

## 2016-10-18 ENCOUNTER — Encounter: Payer: Self-pay | Admitting: Neurology

## 2016-10-18 ENCOUNTER — Ambulatory Visit (INDEPENDENT_AMBULATORY_CARE_PROVIDER_SITE_OTHER): Payer: Medicare Other | Admitting: Neurology

## 2016-10-18 ENCOUNTER — Other Ambulatory Visit: Payer: Self-pay | Admitting: Neurology

## 2016-10-18 VITALS — BP 110/70 | HR 74 | Ht 66.0 in | Wt 298.1 lb

## 2016-10-18 DIAGNOSIS — F419 Anxiety disorder, unspecified: Secondary | ICD-10-CM

## 2016-10-18 DIAGNOSIS — R259 Unspecified abnormal involuntary movements: Secondary | ICD-10-CM | POA: Diagnosis not present

## 2016-10-18 DIAGNOSIS — R202 Paresthesia of skin: Secondary | ICD-10-CM

## 2016-10-18 LAB — TSH: TSH: 3 m[IU]/L

## 2016-10-18 NOTE — Patient Instructions (Signed)
1.   Check thyroid level 2.   Follow-up with dermatology for rosacea  3.   Recommend seeing a counselor for stress management

## 2016-10-18 NOTE — Progress Notes (Signed)
Note routed

## 2016-10-18 NOTE — Progress Notes (Signed)
[facility] Clinic Note - Initial Visit   Date: 10/18/16  Jody Turner MRN: [id number] DOB: 08-Mar-1961   Dear Dr. Kary Kos:  Thank you for your kind referral of Jody Turner for consultation of facial pain. Although her history is well known to you, please allow [place] to reiterate it for the purpose of our medical record. The patient was accompanied to the clinic by mother who also provides collateral information.     History of Present Illness: Jody Turner is a 56 y.o. right-handed Caucasian female with hypertension, fibromyalgia, depression, basal cell carcinoma (right neck), s/p lumbar surgery for HNP presenting for evaluation of facial pain and teeth clattering.    Starting around late fall 2017, she started noticing spells of redness of her both cheeks, associated with hot and burning sensation. Sometimes, there is also swelling over the checks. The pain is very localized to the cheeks and does not radiate.  There is no shooting/radiating pain, no numbness/tingling, or facial weakness.  Pain lasts about 10-15 minutes and is relieved with cool sensation.  It is worse when she comes out of a hot shower.  She has a pulsating sensation over the upper lip and constant teeth chattering for the past month.  There are also spells of episodic blurry vision.   She has history of headaches and takes topiramate 25mg  daily.    She is very stressed and states her husband was recently diagnosed with Parkinson's disease and no longer working.  Daughter also diagnosed with a hamartoma.    She went to the ER on March 13th for her facial pain and was diagnosed with trigeminal neuralgia and given a prescription for carbamazepine, but she did not pick the prescription up because of side effect profile.    Out-side paper records, electronic medical record, and images have been reviewed where available and summarized as:  Lab Results  Component Value Date   NA 139 10/04/2016   K 4.1  10/04/2016   CL 105 10/04/2016   CO2 28 09/17/2011   Lab Results  Component Value Date   WBC 8.2 12/05/2013   HGB 13.6 10/04/2016   HCT 40.0 10/04/2016   MCV 84 12/05/2013   PLT 230 09/17/2011     Past Medical History:  Diagnosis Date  . Arthritis    HNP- lumbar, DJD, spondylosis  . Cancer (HCC)    r side of neck- basal cell ca  . Depression    depression, trouble sleeping   . Fibromyalgia    no medical treatment - treats naturally /w exercise   . Hypertension   . Osteoarthritis     Past Surgical History:  Procedure Laterality Date  . BACK SURGERY     corpectomy, cerv. fusion -2004  . CARPAL TUNNEL RELEASE Right   . FOOT OSTEOTOMY W/ PLANTAR FASCIA RELEASE     R foot, Coton Evans procedure   . LASER ABLATION     both legs, sclerotherapy   . LUMBAR LAMINECTOMY/DECOMPRESSION MICRODISCECTOMY  09/17/2011   Procedure: LUMBAR LAMINECTOMY/DECOMPRESSION MICRODISCECTOMY;  Surgeon: Floyce Stakes, MD;  Location: [facility];  Service: Neurosurgery;  Laterality: N/A;  Left Lumbar three-four, Right Lumbar four-five Discectomies  . TONSILLECTOMY     as a child   . VAGINAL DELIVERY    . VARICOSE VEIN SURGERY     segmental, bilateral      Medications:  Outpatient Encounter Prescriptions as of 10/18/2016  Medication Sig  . citalopram (CELEXA) 20 MG tablet Take  20 mg by mouth daily.  Marland Kitchen HYDROcodone-acetaminophen (NORCO/VICODIN) 5-325 MG tablet Limit one half to one tablet by mouth per day if tolerated  . topiramate (TOPAMAX) 25 MG capsule Take 50 mg by mouth 2 (two) times daily.  . Vitamin D, Ergocalciferol, (DRISDOL) 50000 UNITS CAPS Take 50,000 Units by mouth every 7 (seven) days. Friday  . [DISCONTINUED] carbamazepine (TEGRETOL-XR) 100 MG 12 hr tablet Take 1 tablet (100 mg total) by mouth 2 (two) times daily.  . [DISCONTINUED] Iron-Folic Acid-Vit G95 (IRON FORMULA PO) Take by mouth.  . [DISCONTINUED] citalopram (CELEXA) 20 MG tablet Take 20 mg by mouth daily at 2 PM daily at  2 PM.   . [DISCONTINUED] methylPREDNISolone (MEDROL DOSEPAK) 4 MG TBPK tablet Take as directed  . [DISCONTINUED] Terbinafine HCl (LAMISIL PO) Take by mouth daily.  . [DISCONTINUED] tiZANidine (ZANAFLEX) 2 MG tablet Limit 1 tablet by mouth daily-tid if tolerated  . [DISCONTINUED] topiramate (TOPAMAX) 25 MG tablet Limit 1 to 2 tablets by mouth bid if tolerated   No facility-administered encounter medications on file as of 10/18/2016.      Allergies:  Allergies  Allergen Reactions  . Sulfa Antibiotics Shortness Of Breath  . Demerol [Meperidine Hcl] Nausea And Vomiting  . Gluten Itching    Bloating   . Morphine And Related Nausea And Vomiting    Family History: Family History  Problem Relation Age of Onset  . Hypertension Mother   . Thyroid disease Mother   . Depression Mother   . Stroke Maternal Grandmother   . Other Maternal Grandmother   . Anesthesia problems Neg Hx   . Hypotension Neg Hx   . Malignant hyperthermia Neg Hx   . Pseudochol deficiency Neg Hx     Social History: Social History  Substance Use Topics  . Smoking status: Never Smoker  . Smokeless tobacco: Never Used  . Alcohol use No   Social History   Social History Narrative   Lives with husband in a 1 story home.  Has one child.     Does not work, on disability since 2001.  RN by trade.   Education: 2 years of college.     Review of Systems:  CONSTITUTIONAL: No fevers, chills, night sweats, or weight loss.   EYES: No visual changes or eye pain ENT: No hearing changes.  No history of nose bleeds.   RESPIRATORY: No cough, wheezing and shortness of breath.   CARDIOVASCULAR: Negative for chest pain, and palpitations.   GI: Negative for abdominal discomfort, blood in stools or black stools.  No recent change in bowel habits.   GU:  No history of incontinence.   MUSCLOSKELETAL: No history of joint pain or swelling.  No myalgias.   SKIN: Negative for lesions, +rash, and itching.   HEMATOLOGY/ONCOLOGY:  Negative for prolonged bleeding, bruising easily, and swollen nodes.  No history of cancer.   ENDOCRINE: Negative for cold or heat intolerance, polydipsia or goiter.   PSYCH:  No depression +anxiety symptoms.   NEURO: As Above.   Vital Signs:  BP 110/70   Pulse 74   Ht 5\' 6"  (1.676 m)   Wt 298 lb 2 oz (135.2 kg)   SpO2 98%   BMI 48.12 kg/m    General Medical Exam:   General:  Well appearing, comfortable.   Eyes/ENT: see cranial nerve examination.  Flushed cheeks, no swelling noted. Neck: No masses appreciated.  Full range of motion without tenderness.  No carotid bruits. Respiratory:  Clear to auscultation, good  air entry bilaterally.   Cardiac:  Regular rate and rhythm, no murmur.   Extremities:  No deformities, edema, or skin discoloration.  Skin:  No rashes or lesions.  Neurological Exam: MENTAL STATUS including orientation to time, place, person, recent and remote memory, attention span and concentration, language, and fund of knowledge is normal.  Speech is not dysarthric.  CRANIAL NERVES: II:  No visual field defects.  Unremarkable fundi.   III-IV-VI: Pupils equal round and reactive to light.  Normal conjugate, extra-ocular eye movements in all directions of gaze.  No nystagmus.  No ptosis.   V:  Normal facial sensation.  Jaw jerk is absent.   VII:  Normal facial symmetry and movements.  No pathologic facial reflexes.  When distracted, there are no abnormal movements of the teeth/jaw.  Patient is able to voluntarily reproduce low amplitude, high frequency chattering of the teeth VIII:  Normal hearing and vestibular function.   IX-X:  Normal palatal movement.   XI:  Normal shoulder shrug and head rotation.   XII:  Normal tongue strength and range of motion, no deviation or fasciculation.  MOTOR:  Motor strength is 5/5 throughout.   No atrophy, fasciculations or abnormal movements.  No pronator drift.  Tone is normal.    MSRs:  Right                                                                  Left brachioradialis 2+  brachioradialis 2+  biceps 2+  biceps 2+  triceps 2+  triceps 2+  patellar 2+  patellar 2+  ankle jerk 2+  ankle jerk 2+  Hoffman no  Hoffman no  plantar response down  plantar response down   SENSORY:  Normal and symmetric perception of light touch, pinprick, vibration, and proprioception.    COORDINATION/GAIT: Normal finger-to- nose-finger  Intact rapid alternating movements bilaterally.  Gait wide-based due to body habitus.   IMPRESSION: Mrs. Nield is a 56 year-old female referred for evaluation of bilateral facial pain and teeth chattering.  Her neurological exam is entirely normal and non-focal.  There is evidence of increased redness of the cheeks bilaterally and on further discussion with patient, she reports being told she has rosacea.  Her facial dysesthesias are most likely related to rosacea and are not consistent with a neurological cause, such as trigeminal neuralgia.  Therefore, I recommend that she not be treated with carbamazepine and instead follow-up with her dermatologist to see if any ointments can be applied.  Regarding her teeth chattering, this is distractible and does not have any worrisome findings. This is uncommon presentation for primary movement disorder and most likely manifestation of stress/anxiety.  She endorses being extremely stressed and overwhelmed over the past few months due to her husband's new diagnosis of Parkinson's disease and him no longer working.  I recommend seeing a counselor for stress management and she also will discuss medication changes to her Celexa with her primary care provider.  To be complete, her TSH will be checked.  With her exam being normal, I do not see that MRI brain is indicated, however, if she develops new neurological symptoms, this can be readdressed.  Return to clinic as needed  The duration of this appointment visit was 45 minutes of face-to-face  time with the patient.   Greater than 50% of this time was spent in counseling, explanation of diagnosis, planning of further management, and coordination of care.   Thank you for allowing me to participate in patient's care.  If I can answer any additional questions, I would be pleased to do so.    Sincerely,    Azariel Banik K. Posey Pronto, DO

## 2016-10-19 ENCOUNTER — Telehealth: Payer: Self-pay | Admitting: *Deleted

## 2016-10-19 NOTE — Telephone Encounter (Signed)
Patient given results

## 2016-10-19 NOTE — Telephone Encounter (Signed)
-----   Message from Alda Berthold, DO sent at 10/19/2016  8:09 AM EDT ----- Please inform patient thyroid is normal. Thanks.

## 2016-10-20 ENCOUNTER — Encounter: Payer: Self-pay | Admitting: Podiatry

## 2016-10-20 ENCOUNTER — Ambulatory Visit (INDEPENDENT_AMBULATORY_CARE_PROVIDER_SITE_OTHER): Payer: Medicare Other | Admitting: Podiatry

## 2016-10-20 DIAGNOSIS — M722 Plantar fascial fibromatosis: Secondary | ICD-10-CM

## 2016-10-20 DIAGNOSIS — B351 Tinea unguium: Secondary | ICD-10-CM | POA: Diagnosis not present

## 2016-10-20 DIAGNOSIS — M779 Enthesopathy, unspecified: Secondary | ICD-10-CM

## 2016-10-20 DIAGNOSIS — Z79899 Other long term (current) drug therapy: Secondary | ICD-10-CM

## 2016-10-20 MED ORDER — TERBINAFINE HCL 250 MG PO TABS: 250.0000 mg | ORAL_TABLET | Freq: Every day | ORAL | 0 refills | Status: DC

## 2016-10-20 MED ORDER — DICLOFENAC SODIUM 1 % TD GEL: 2.0000 g | Freq: Four times a day (QID) | TRANSDERMAL | 2 refills | Status: AC

## 2016-10-20 NOTE — Patient Instructions (Signed)
Plantar Fasciitis Rehab Ask your health care provider which exercises are safe for you. Do exercises exactly as told by your health care provider and adjust them as directed. It is normal to feel mild stretching, pulling, tightness, or discomfort as you do these exercises, but you should stop right away if you feel sudden pain or your pain gets worse. Do not begin these exercises until told by your health care provider. Stretching and range of motion exercises These exercises warm up your muscles and joints and improve the movement and flexibility of your foot. These exercises also help to relieve pain. Exercise A: Plantar fascia stretch   1. Sit with your left / right leg crossed over your opposite knee. 2. Hold your heel with one hand with that thumb near your arch. With your other hand, hold your toes and gently pull them back toward the top of your foot. You should feel a stretch on the bottom of your toes or your foot or both. 3. Hold this stretch for__________ seconds. 4. Slowly release your toes and return to the starting position. Repeat __________ times. Complete this exercise __________ times a day. Exercise B: Gastroc, standing   1. Stand with your hands against a wall. 2. Extend your left / right leg behind you, and bend your front knee slightly. 3. Keeping your heels on the floor and keeping your back knee straight, shift your weight toward the wall without arching your back. You should feel a gentle stretch in your left / right calf. 4. Hold this position for __________ seconds. Repeat __________ times. Complete this exercise __________ times a day. Exercise C: Soleus, standing  1. Stand with your hands against a wall. 2. Extend your left / right leg behind you, and bend your front knee slightly. 3. Keeping your heels on the floor, bend your back knee and slightly shift your weight over the back leg. You should feel a gentle stretch deep in your calf. 4. Hold this position for  __________ seconds. Repeat __________ times. Complete this exercise __________ times a day. Exercise D: Gastrocsoleus, standing  1. Stand with the ball of your left / right foot on a step. The ball of your foot is on the walking surface, right under your toes. 2. Keep your other foot firmly on the same step. 3. Hold onto the wall or a railing for balance. 4. Slowly lift your other foot, allowing your body weight to press your heel down over the edge of the step. You should feel a stretch in your left / right calf. 5. Hold this position for __________ seconds. 6. Return both feet to the step. 7. Repeat this exercise with a slight bend in your left / right knee. Repeat __________ times with your left / right knee straight and __________ times with your left / right knee bent. Complete this exercise __________ times a day. Balance exercise This exercise builds your balance and strength control of your arch to help take pressure off your plantar fascia. Exercise E: Single leg stand  1. Without shoes, stand near a railing or in a doorway. You may hold onto the railing or door frame as needed. 2. Stand on your left / right foot. Keep your big toe down on the floor and try to keep your arch lifted. Do not let your foot roll inward. 3. Hold this position for __________ seconds. 4. If this exercise is too easy, you can try it with your eyes closed or while standing on a   pillow. Repeat __________ times. Complete this exercise __________ times a day. This information is not intended to replace advice given to you by your health care provider. Make sure you discuss any questions you have with your health care provider. Document Released: 07/12/2005 Document Revised: 03/16/2016 Document Reviewed: 05/26/2015 Elsevier Interactive Patient Education  2017 [employer]    Terbinafine oral granules What is this medicine? TERBINAFINE (TER bin a feen) is an antifungal medicine. It is used to treat certain  kinds of fungal or yeast infections. This medicine may be used for other purposes; ask your health care provider or pharmacist if you have questions. COMMON BRAND NAME(S): Lamisil What should I tell my health care provider before I take this medicine? They need to know if you have any of these conditions: -drink alcoholic beverages -kidney disease -liver disease -an unusual or allergic reaction to Terbinafine, other medicines, foods, dyes, or preservatives -pregnant or trying to get pregnant -breast-feeding How should I use this medicine? Take this medicine by mouth. Follow the directions on the prescription label. Hold packet with cut line on top. Shake packet gently to settle contents. Tear packet open along cut line, or use scissors to cut across line. Carefully pour the entire contents of packet onto a spoonful of a soft food, such as pudding or other soft, non-acidic food such as mashed potatoes (do NOT use applesauce or a fruit-based food). If two packets are required for each dose, you may either sprinkle the content of both packets on one spoonful of non-acidic food, or sprinkle the contents of both packets on two spoonfuls of non-acidic food. Make sure that no granules remain in the packet. Swallow the mxiture of the food and granules without chewing. Take your medicine at regular intervals. Do not take it more often than directed. Take all of your medicine as directed even if you think you are better. Do not skip doses or stop your medicine early. Contact your pediatrician or health care professional regarding the use of this medicine in children. While this medicine may be prescribed for children as young as 4 years for selected conditions, precautions do apply. Overdosage: If you think you have taken too much of this medicine contact a poison control center or emergency room at once. NOTE: This medicine is only for you. Do not share this medicine with others. What if I miss a dose? If  you miss a dose, take it as soon as you can. If it is almost time for your next dose, take only that dose. Do not take double or extra doses. What may interact with this medicine? Do not take this medicine with any of the following medications: -thioridazine This medicine may also interact with the following medications: -beta-blockers -caffeine -cimetidine -cyclosporine -MAOIs like Carbex, Eldepryl, Marplan, Nardil, and Parnate -medicines for fungal infections like fluconazole and ketoconazole -medicines for irregular heartbeat like amiodarone, flecainide and propafenone -rifampin -SSRIs like citalopram, escitalopram, fluoxetine, fluvoxamine, paroxetine and sertraline -tricyclic antidepressants like amitriptyline, clomipramine, desipramine, imipramine, nortriptyline, and others -warfarin This list may not describe all possible interactions. Give your health care provider a list of all the medicines, herbs, non-prescription drugs, or dietary supplements you use. Also tell them if you smoke, drink alcohol, or use illegal drugs. Some items may interact with your medicine. What should I watch for while using this medicine? Your doctor may monitor your liver function. Tell your doctor right away if you have nausea or vomiting, loss of appetite, stomach pain on your right  upper side, yellow skin, dark urine, light stools, or are over tired. You need to take this medicine for 6 weeks or longer to cure the fungal infection. Take your medicine regularly for as long as your doctor or health care professional tells you to. What side effects may I notice from receiving this medicine? Side effects that you should report to your doctor or health care professional as soon as possible: -allergic reactions like skin rash or hives, swelling of the face, lips, or tongue -change in vision -dark urine -fever or infection -general ill feeling or flu-like symptoms -light-colored stools -loss of appetite,  nausea -redness, blistering, peeling or loosening of the skin, including inside the mouth -right upper belly pain -unusually weak or tired -yellowing of the eyes or skin Side effects that usually do not require medical attention (report to your doctor or health care professional if they continue or are bothersome): -changes in taste -diarrhea -hair loss -muscle or joint pain -stomach upset This list may not describe all possible side effects. Call your doctor for medical advice about side effects. You may report side effects to FDA at 1-800-FDA-1088. Where should I keep my medicine? Keep out of the reach of children. Store at room temperature between 15 and 30 degrees C (59 and 86 degrees F). Throw away any unused medicine after the expiration date. NOTE: This sheet is a summary. It may not cover all possible information. If you have questions about this medicine, talk to your doctor, pharmacist, or health care provider.  2018 Elsevier/Gold Standard (2007-09-22 17:25:48)

## 2016-10-21 NOTE — Progress Notes (Signed)
Subjective: 56 year old female presents the office they for follow-up evaluation continue toenail discoloration, thickening. She was on Lamisil about 1.5 years ago and this did help after stopping the medication she said her reoccurrence. She is also been using the topical medicine and not noticing much improvement. She is inquiring possibly going back on Lamisil. She also states that both of her feet hurt. She went she wears the brace and the inserts this helps quite a bit but she does not wear them all with time discontinued pain. The majority of his bottoms of both of her heels but she also points to the office after the foot along the sinus tarsi where she points were shoes majority of symptoms as well. She states the bottoms of her heels hurt more however. She denies any recent injury or trauma. His tenderness is chronic this in ongoing for some time. She denies any recent injury or trauma. Denies any systemic complaints such as fevers, chills, nausea, vomiting. No acute changes since last appointment, and no other complaints at this time.   Objective: AAO x3, NAD DP/PT pulses palpable bilaterally, CRT less than 3 seconds Tenderness to palpation along the plantar medial tubercle of the calcaneus at the insertion of plantar fascia on the left and right foot. There is no pain along the course of the plantar fascia within the arch of the foot. Plantar fascia appears to be intact. There is no pain with lateral compression of the calcaneus or pain with vibratory sensation. There is no pain along the course or insertion of the achilles tendon. Mild discomfort along the lateral aspect of the right foot on the sinus tarsi. Is no pain with subtalar joint range of motion. No other areas of tenderness to bilateral lower extremities. No severe be hypertrophic, dystrophic, discolored with yellow-brown discoloration. There is no swelling or redness, drainage or any clinical signs of infection. There is no pain of the  toenails today. No open lesions or pre-ulcerative lesions.  No pain with calf compression, swelling, warmth, erythema  Assessment: Bilateral foot pain with chronic plantar fasciitis, right subtalar joint capsulitis; onychomycosis  Plan: -All treatment options discussed with the patient including all alternatives, risks, complications.  -Discussed treatment options today for nail fungus. We will restart Lamisil. Ordered CBC LFT. He said the medications I call her with the results of blood work. Discussed side effects the medication call the office medially should any occur. -Patient elects to proceed with steroid injection into the left and right heel. Under sterile skin preparation, a total of 2.5cc of kenalog 10, 0.5% Marcaine plain, and 2% lidocaine plain were infiltrated into the symptomatic area without complication. A band-aid was applied. Patient tolerated the injection well without complication. Post-injection care with discussed with the patient. Discussed with the patient to ice the area over the next couple of days to help prevent a steroid flare.  -Continue plantar fascial braces. Continue stretching, icing on. Also this time we will start physical therapy to prescriptions predatory for this. Also encouraged her to wear the orthotics in the house and not to go barefoot. She is hallux as she is been going barefoot and she states this hurts a lot. -Discussed MRI but she declines.  -RTC 6 weeks or sooner if needed.  -Patient encouraged to call the office with any questions, concerns, change in symptoms.   Celesta Gentile, DPM

## 2016-10-22 NOTE — Addendum Note (Signed)
Addended by: Harriett Sine D on: 10/22/2016 07:47 AM   Modules accepted: Orders

## 2016-11-10 LAB — HEPATIC FUNCTION PANEL
ALBUMIN: 3.9 g/dL (ref 3.6–5.1)
ALT: 26 U/L (ref 6–29)
AST: 21 U/L (ref 10–35)
Alkaline Phosphatase: 78 U/L (ref 33–130)
BILIRUBIN INDIRECT: 0.4 mg/dL (ref 0.2–1.2)
Bilirubin, Direct: 0.1 mg/dL (ref <=0.2)
TOTAL PROTEIN: 6.4 g/dL (ref 6.1–8.1)
Total Bilirubin: 0.5 mg/dL (ref 0.2–1.2)

## 2016-11-10 LAB — CBC WITH DIFFERENTIAL/PLATELET
BASOS PCT: 0 %
Basophils Absolute: 0 cells/uL (ref 0–200)
EOS ABS: 176 {cells}/uL (ref 15–500)
Eosinophils Relative: 2 %
HCT: 40.4 % (ref 35.0–45.0)
Hemoglobin: 13.3 g/dL (ref 11.7–15.5)
Lymphocytes Relative: 25 %
Lymphs Abs: 2200 cells/uL (ref 850–3900)
MCH: 27.9 pg (ref 27.0–33.0)
MCHC: 32.9 g/dL (ref 32.0–36.0)
MCV: 84.7 fL (ref 80.0–100.0)
MONOS PCT: 4 %
MPV: 9.9 fL (ref 7.5–12.5)
Monocytes Absolute: 352 cells/uL (ref 200–950)
Neutro Abs: 6072 cells/uL (ref 1500–7800)
Neutrophils Relative %: 69 %
PLATELETS: 299 10*3/uL (ref 140–400)
RBC: 4.77 MIL/uL (ref 3.80–5.10)
RDW: 14.9 % (ref 11.0–15.0)
WBC: 8.8 10*3/uL (ref 3.8–10.8)

## 2016-11-12 ENCOUNTER — Telehealth: Payer: Self-pay | Admitting: *Deleted

## 2016-11-12 NOTE — Telephone Encounter (Addendum)
-----   Message from Trula Slade, DPM sent at 11/12/2016  6:24 AM EDT ----- Blood work normal- can start lamisil. Please let her know. Informed pt of Dr. Leigh Aurora review of results and orders.

## 2016-12-02 ENCOUNTER — Ambulatory Visit: Payer: Medicare Other | Admitting: Podiatry

## 2017-02-04 ENCOUNTER — Other Ambulatory Visit: Payer: Self-pay | Admitting: Family Medicine

## 2017-02-04 DIAGNOSIS — Z1231 Encounter for screening mammogram for malignant neoplasm of breast: Secondary | ICD-10-CM

## 2017-02-10 ENCOUNTER — Other Ambulatory Visit: Payer: Self-pay | Admitting: Physical Medicine and Rehabilitation

## 2017-02-10 DIAGNOSIS — M549 Dorsalgia, unspecified: Secondary | ICD-10-CM

## 2017-02-10 DIAGNOSIS — M542 Cervicalgia: Secondary | ICD-10-CM

## 2017-02-16 ENCOUNTER — Ambulatory Visit
Admission: RE | Admit: 2017-02-16 | Discharge: 2017-02-16 | Disposition: A | Discharge status: Home / Self Care | Payer: Medicare Other | Source: Ambulatory Visit | Attending: Physical Medicine and Rehabilitation | Admitting: Physical Medicine and Rehabilitation

## 2017-02-16 ENCOUNTER — Other Ambulatory Visit: Payer: Self-pay | Admitting: Physical Medicine and Rehabilitation

## 2017-02-16 VITALS — BP 135/75 | HR 63

## 2017-02-16 DIAGNOSIS — M542 Cervicalgia: Secondary | ICD-10-CM

## 2017-02-16 DIAGNOSIS — M549 Dorsalgia, unspecified: Secondary | ICD-10-CM

## 2017-02-16 DIAGNOSIS — M5136 Other intervertebral disc degeneration, lumbar region: Secondary | ICD-10-CM

## 2017-02-16 DIAGNOSIS — M5030 Other cervical disc degeneration, unspecified cervical region: Secondary | ICD-10-CM

## 2017-02-16 DIAGNOSIS — M47816 Spondylosis without myelopathy or radiculopathy, lumbar region: Secondary | ICD-10-CM

## 2017-02-16 IMAGING — CT CT T SPINE W/ CM
1 series · 12 of 14 positions shown, 15 images · non-contrast
Comparison: none

CLINICAL DATA: Low back pain.  Mid back pain.  Neck pain.
TECHNIQUE: Contiguous axial images were obtained through the Cervical,
Thoracic, and Lumbar spine after the intrathecal infusion of
infusion. Coronal and sagittal reconstructions were obtained of the
axial image sets.

[Series 3: t spine soft · axial · 0.39mm/px · z∈[+602,+905]mm · 12 of 121 slices shown, 15 images]
[im 10/121  soft-tissue]
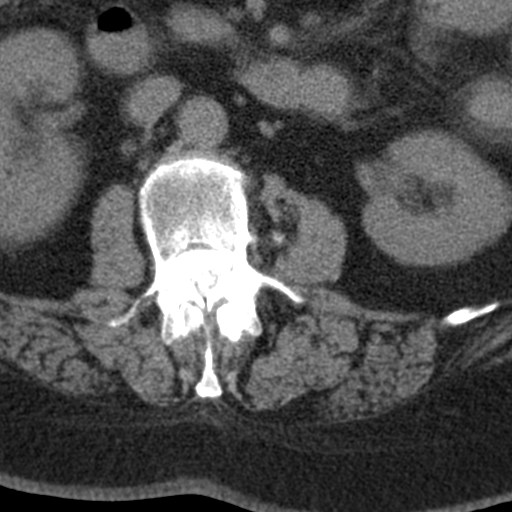
[im 10/121  bone]
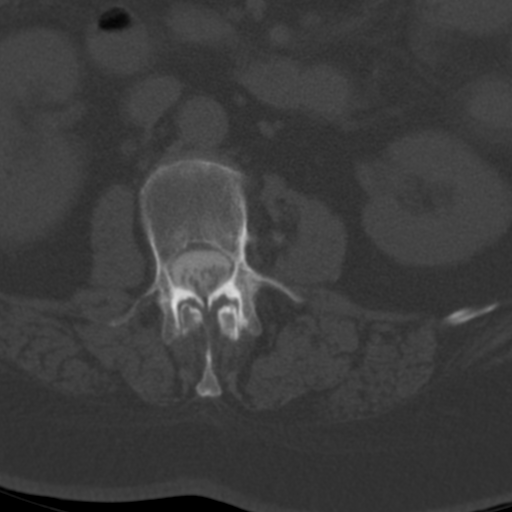
[im 19/121  bone]
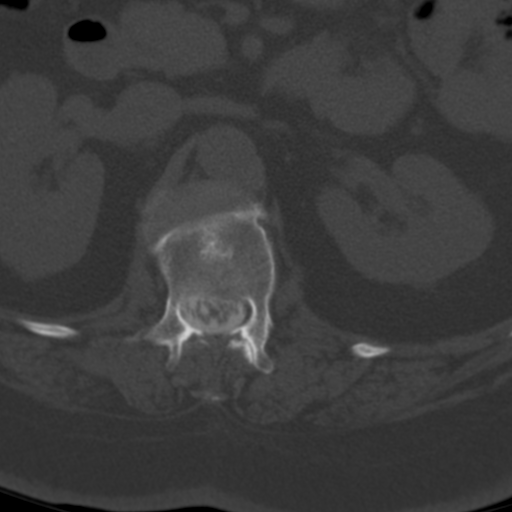
[im 28/121  bone]
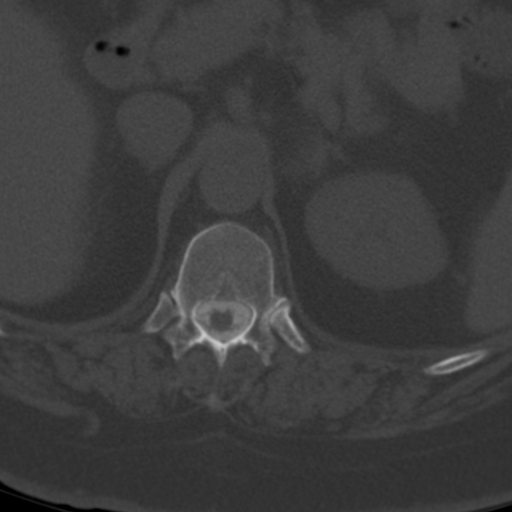
[im 37/121  bone]
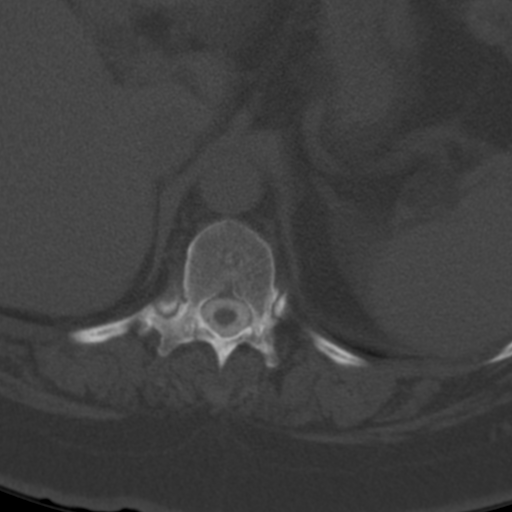
[im 47/121  soft-tissue]
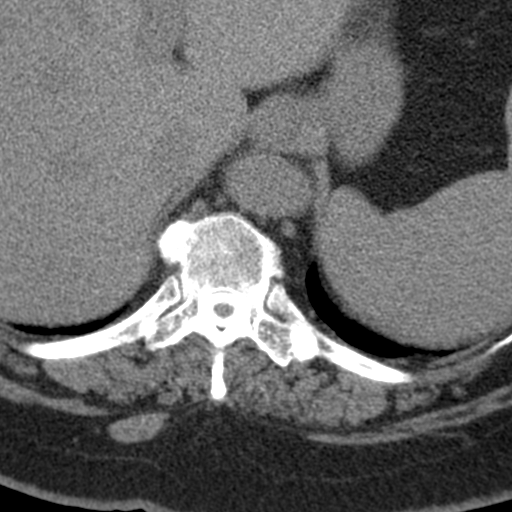
[im 47/121  bone]
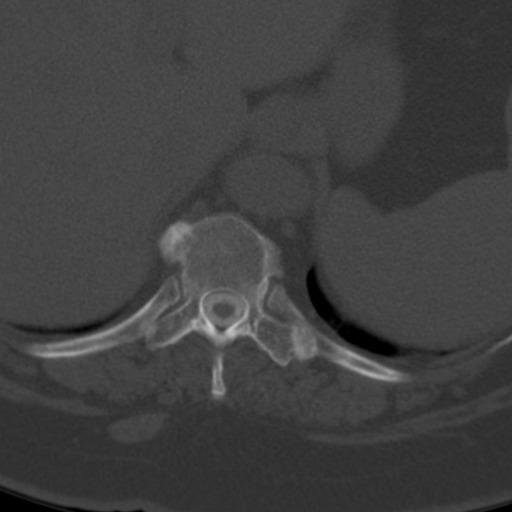
[im 56/121  bone]
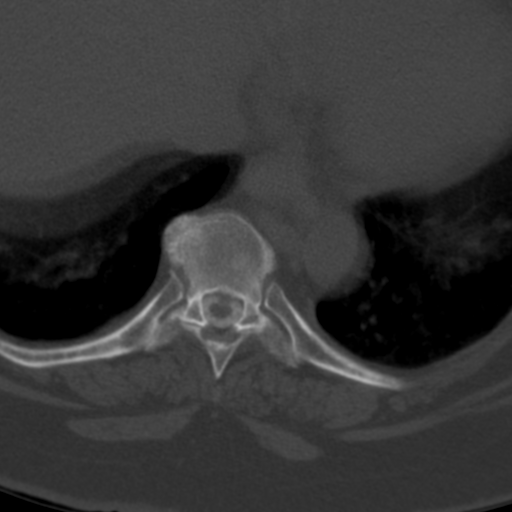
[im 65/121  bone]
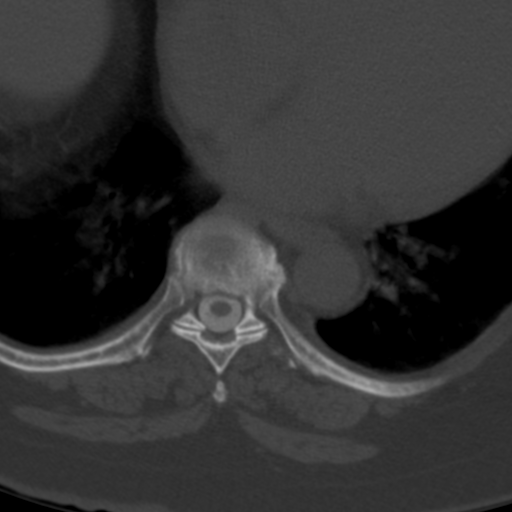
[im 74/121  bone]
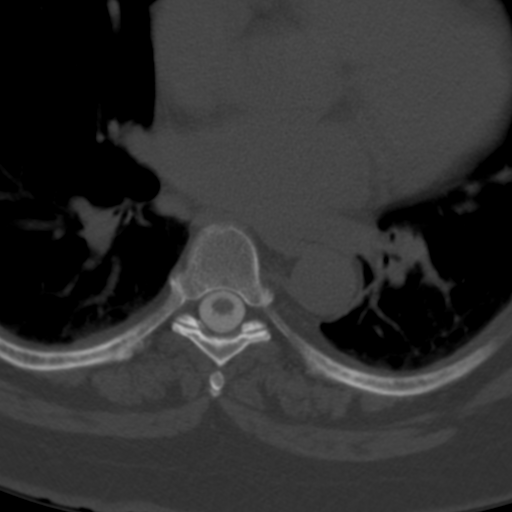
[im 84/121  soft-tissue]
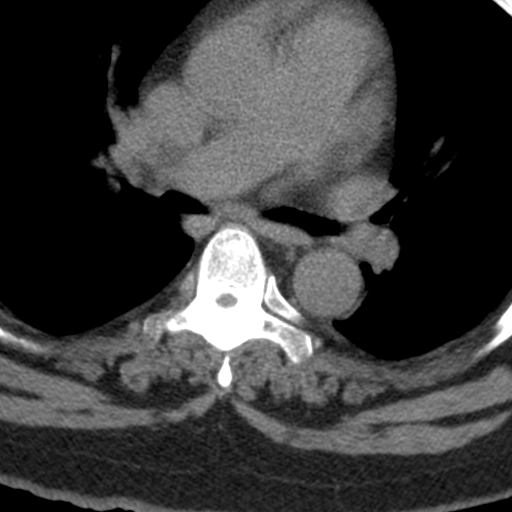
[im 84/121  bone]
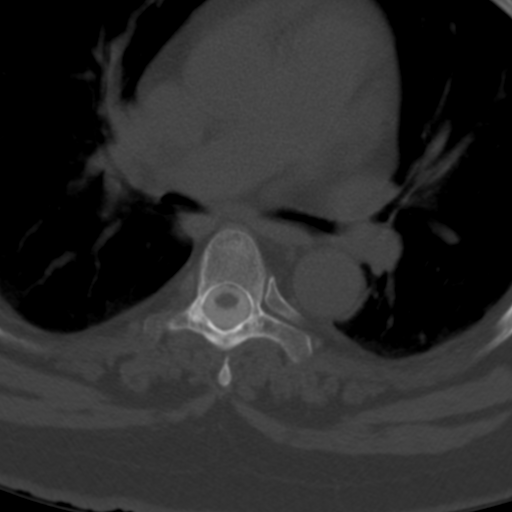
[im 93/121  bone]
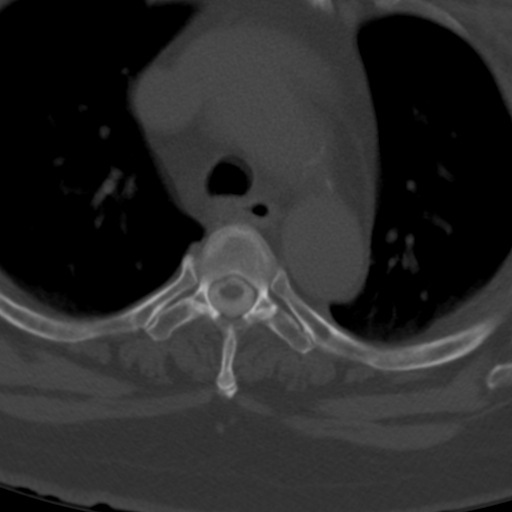
[im 102/121  bone]
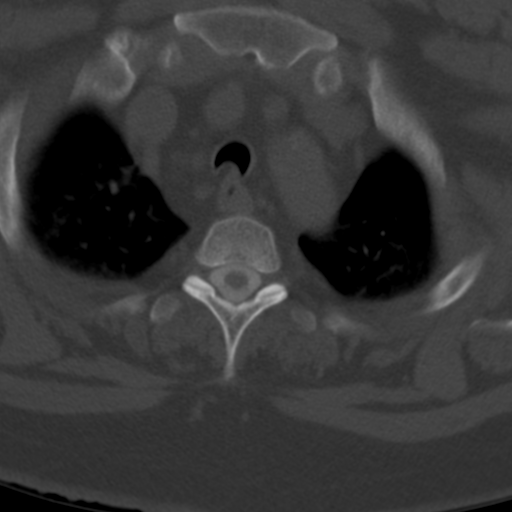
[im 111/121  bone]
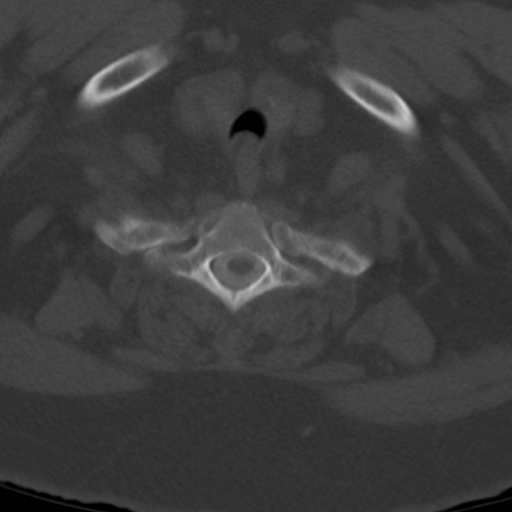

[12 of 14 positions shown; findings below may reference images not displayed]

FLUOROSCOPY TIME:  2 minutes and 27 seconds corresponding to a Dose
Area Product of [RY] ?Gy*m2

PROCEDURE:
LUMBAR PUNCTURE FOR CERVICAL LUMBAR AND THORACIC MYELOGRAM

CERVICAL AND LUMBAR AND THORACIC MYELOGRAM

CT CERVICAL MYELOGRAM

CT LUMBAR MYELOGRAM

CT THORACIC MYELOGRAM

After thorough discussion of risks and benefits of the procedure
including bleeding, infection, injury to nerves, blood vessels,
adjacent structures as well as headache and CSF leak, written and
oral informed consent was obtained. Consent was obtained by Dr. BRARI
BRARI.

Patient was positioned prone on the fluoroscopy table. Local
anesthesia was provided with 1% lidocaine without epinephrine after
prepped and draped in the usual sterile fashion. Puncture was
performed at L5-S1 using a 6 inch 22-gauge spinal needle via midline
approach. Using a single pass through the dura, the needle was
placed within the thecal sac, with return of clear CSF. 10 mL
[RY] was injected into the thecal sac, with normal
opacification of the nerve roots and cauda equina consistent with
free flow within the subarachnoid space. The patient was then moved
to the trendelenburg position and contrast flowed into the Thoracic
and Cervical spine regions.

I personally performed the lumbar puncture and administered the
intrathecal contrast. I also personally supervised acquisition of
the myelogram images.
FINDINGS: CERVICAL AND THORACIC AND LUMBAR MYELOGRAM FINDINGS:

LUMBAR: Degenerative scoliosis convex RIGHT approximately 10
degrees, mid lumbar region, related to asymmetric loss of interspace
height at L3-4 on the LEFT. LEFT L4 nerve root impingement is noted.
There is a severe compression fracture of L1, mildly retropulsed,
roughly 3 mm, with osseous spurring. Stenosis is present at L3-4,
LEFT greater than RIGHT, as well as at L1-2. There is compensatory
asymmetric loss of interspace height at L4-5 on the RIGHT. This
results in RIGHT L5 nerve root impingement.

Standing flexion extension demonstrates no dynamic instability.

THORACIC: No spinal stenosis or cord compression. Moderate ventral
defect at T8-9 related to central disc protrusion and osseous
spurring. Smaller ventral defects at T9-10 and T7-8. No intraspinal
mass lesion.

CERVICAL: Good opacification subarachnoid space. Previous C5-6 ACDF.
Hardware intact but no definite solid interbody arthrodesis. Slight
anterolisthesis C4-5 and C3-4, 1-2 mm.

CT CERVICAL MYELOGRAM FINDINGS:

Alignment: 2 mm anterolisthesis C4-5.  1 mm anterolisthesis C3-4.

Vertebrae: Unremarkable.

Cord: No cord compression or abnormal cord size.

Posterior Fossa: No tonsillar herniation.

Vertebral Arteries: Not assessed.

Paraspinal tissues: Unremarkable.

Disc levels:

C2-3:  Unremarkable.

C3-4: 1 mm anterolisthesis. Facet arthropathy. No definite
impingement.

C4-5: 2 mm anterolisthesis. Facet arthropathy. No definite
impingement.

C5-6: Hardware intact, but no solid interbody arthrodesis.
Subsidence of the cage into C6. Loss of interspace height. No
definite C6 foraminal narrowing.

C6-7: Disc space narrowing. Uncinate spurring to the RIGHT. RIGHT C7
foraminal narrowing.

C7-T1:  Unremarkable.

CT LUMBAR MYELOGRAM FINDINGS:

Segmentation: Normal.

Alignment: 4 mm retropulsion of L1 on L2 with osseous spurring.
Slight retrolisthesis L3-4 is facet mediated.

Vertebrae: No worrisome osseous lesion.Healed compression fracture
L1, near complete loss of height centrally.

Conus medullaris: Posteriorly displaced.

Paraspinal tissues: No evidence for hydronephrosis or paravertebral
mass.

Disc levels:

L1-L2: Central protrusion is partially calcified associated with
retropulsed bone. In addition there is leftward subluxation L1 on
L2. Marked osseous spurring to LEFT. Mild to moderate stenosis. LEFT
L1 and LEFT L2 nerve root impingement.

L2-L3: Central and leftward protrusion facets are unremarkable. LEFT
L3 nerve root impingement. Mild stenosis.

L3-L4: Asymmetric loss of interspace height. Vacuum phenomenon.
Central and leftward protrusion. Moderate facet disease, worse on
the LEFT. LEFT L3 and LEFT L4 nerve root impingement.

L4-L5: Asymmetric loss of interspace height to the RIGHT. Central
and rightward protrusion. Severe asymmetric posterior element
hypertrophy to the RIGHT. RIGHT L4 and RIGHT L5 nerve root
impingement.

L5-S1: Unremarkable disc space. Severe RIGHT and moderate LEFT facet
arthropathy. No subarticular zone or foraminal zone narrowing of
significance.

CT THORACIC MYELOGRAM FINDINGS:

The individual disc spaces are examined as follows:

T1-2:  Normal.

T2-3:  Normal.

T3-4:  Normal.

T4-5: Slight bulge. Slight superior endplate depression at T5. No
retropulsion.

T5-6: Slight bulge. Slight superior endplate depression at T6. No
retropulsion.

T6-7:  Annular bulge with spurring.  No impingement.

T7-8:  Annular bulge with spurring.  No impingement.

T8-9: Central and leftward protrusion with osteophytic spurring.
Mild posterior displacement of the cord without significant
stenosis. No foraminal narrowing.

T9-10:  Mild bulge.  No impingement.

T10-11:  Mild bulge.  No impingement.

T11-12: Unremarkable disc space. Ligamentum flavum calcification. No
impingement.

T12-L1: Posteriorly displaced L1 is accompanied by annular bulging.
No definite impingement.

Compared with most recent myelogram, [DATE], similar appearance
is noted.
IMPRESSION: LUMBAR: Chronic L1 compression fracture. Retropulsion of bone
results in posterior displacement conus without critical stenosis.

LEFT-sided neural impingement at L1-2, L2-3, and L3-4 as described
above. RIGHT-sided neural impingement at L4-5 is compensatory.

THORACIC: Multiple thoracic disc protrusions, most pronounced at
T8-9 central and to the LEFT. Similar appearance to priors. No
significant spinal stenosis or frank cord flattening.

CERVICAL: Status post C5-6 ACDF, with pseudarthrosis. Adjacent
segment disease at C6-7, with RIGHT-sided foraminal narrowing due to
uncinate spurring.

## 2017-02-16 IMAGING — XA DG MYELOGRAPHY LUMBAR INJ MULTI REGION
12 of 24 series · 12 of 24 positions shown · non-contrast
Comparison: none

CLINICAL DATA: Low back pain.  Mid back pain.  Neck pain.
TECHNIQUE: Contiguous axial images were obtained through the Cervical,
Thoracic, and Lumbar spine after the intrathecal infusion of
infusion. Coronal and sagittal reconstructions were obtained of the
axial image sets.

[Series 2: w lumbar spine lat · 0.15mm/px · 1 of 1 slices shown]
[im 1/1]
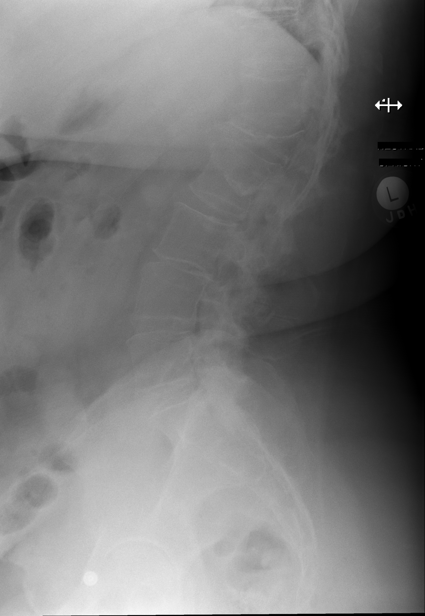

[Series 3: vasc adipose · 1 of 1 slices shown (1 of 10)]
[im 1/1]
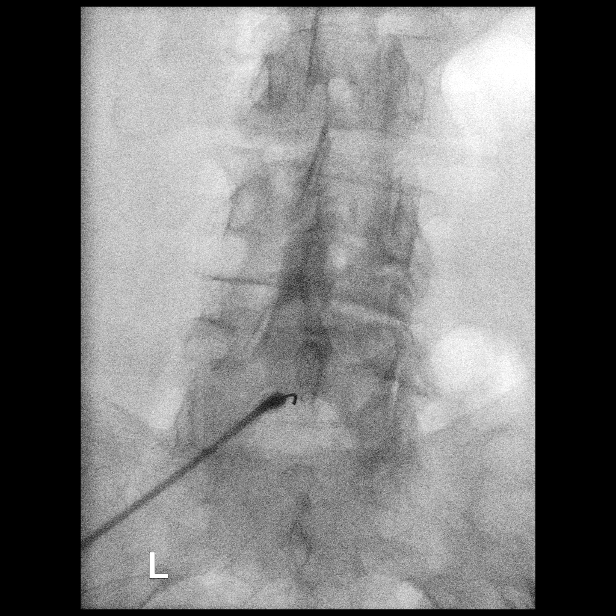

[Series 4: w lumbar spine extension · 0.15mm/px · 1 of 1 slices shown]
[im 1/1]
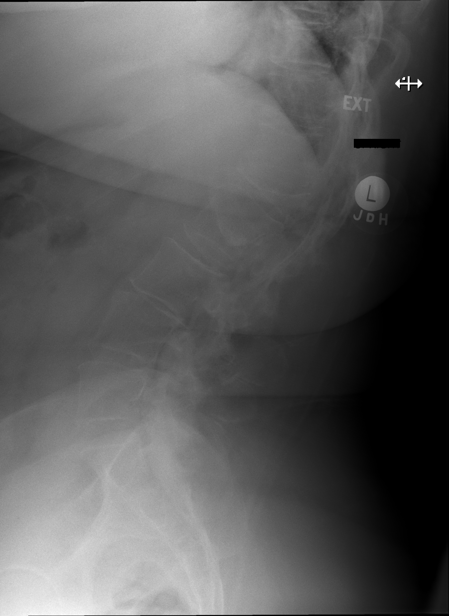

[Series 5: vasc adipose · 1 of 1 slices shown (2 of 10)]
[im 1/1]
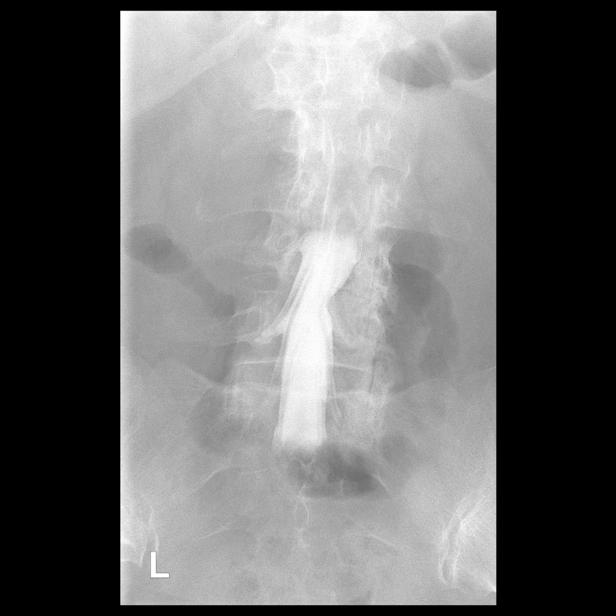

[Series 7: vasc adipose · 1 of 1 slices shown (3 of 10)]
[im 1/1]
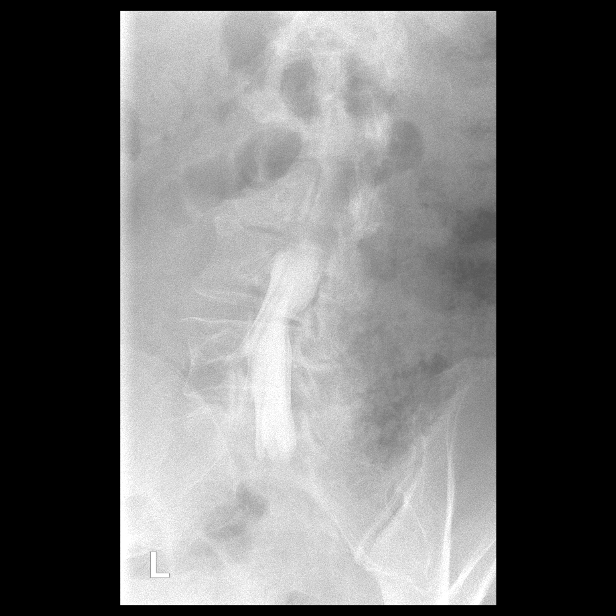

[Series 9: vasc adipose · 1 of 1 slices shown (4 of 10)]
[im 1/1]
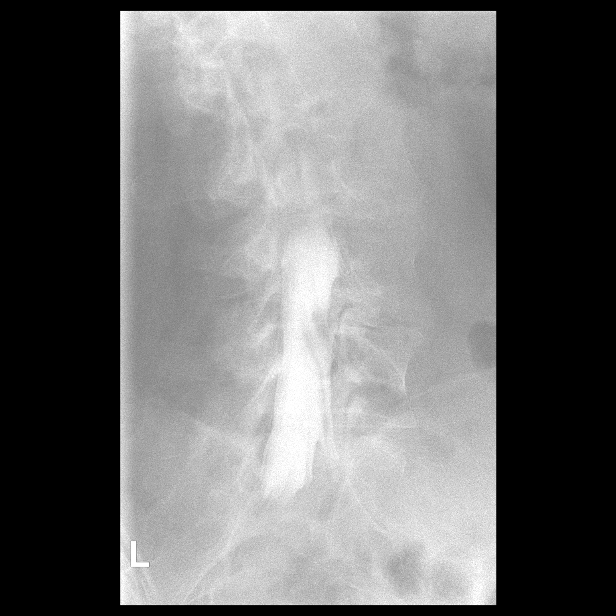

[Series 11: vasc adipose · 1 of 1 slices shown (5 of 10)]
[im 1/1]
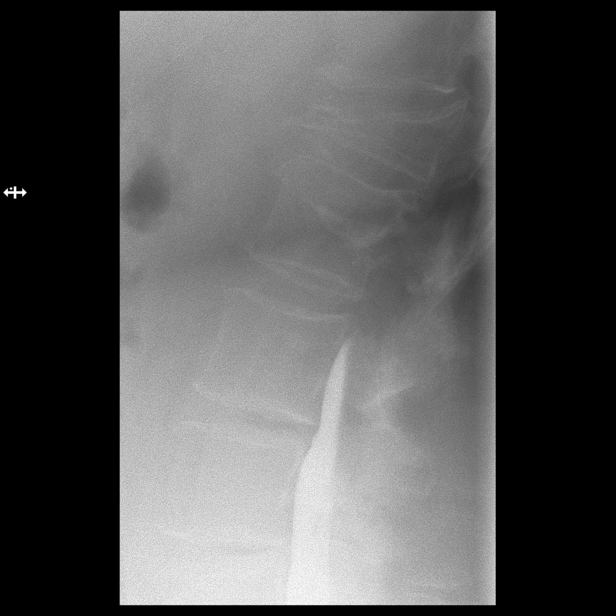

[Series 13: vasc adipose · 1 of 1 slices shown (6 of 10)]
[im 1/1]
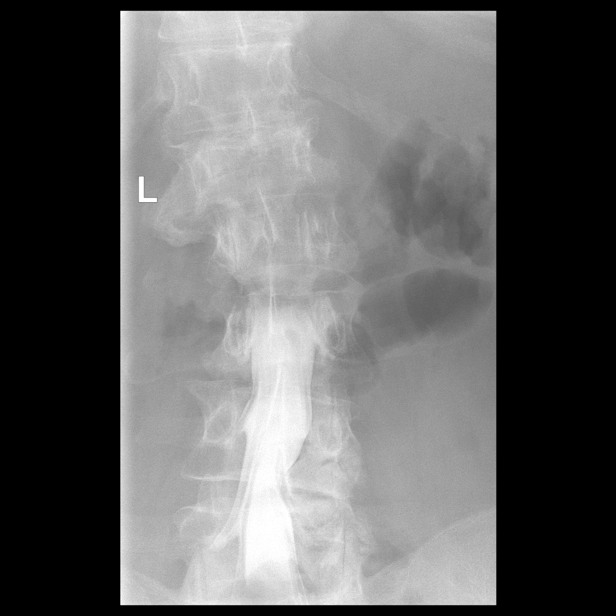

[Series 15: vasc adipose · 1 of 1 slices shown (7 of 10)]
[im 1/1]
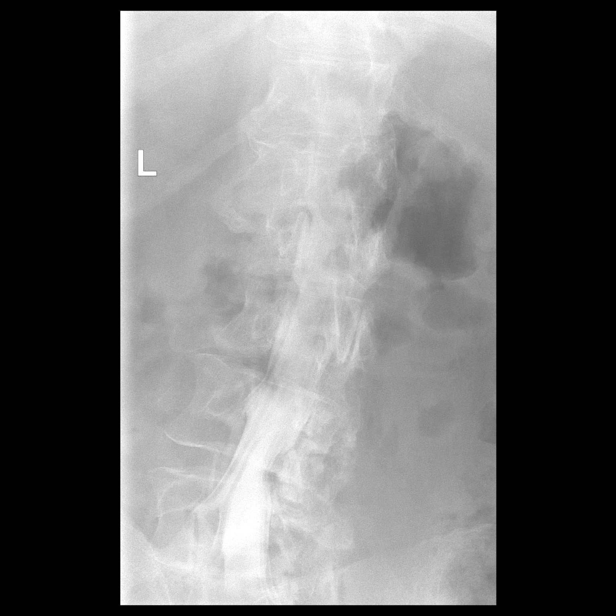

[Series 17: vasc adipose · 1 of 1 slices shown (8 of 10)]
[im 1/1]
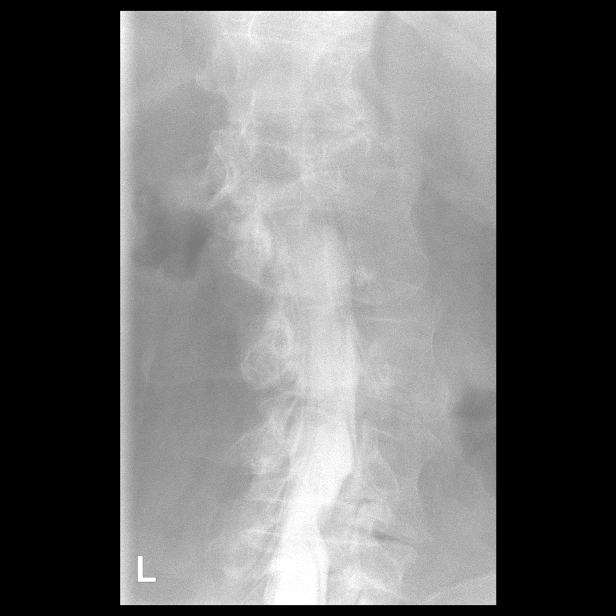

[Series 19: vasc adipose · 1 of 1 slices shown (9 of 10)]
[im 1/1]
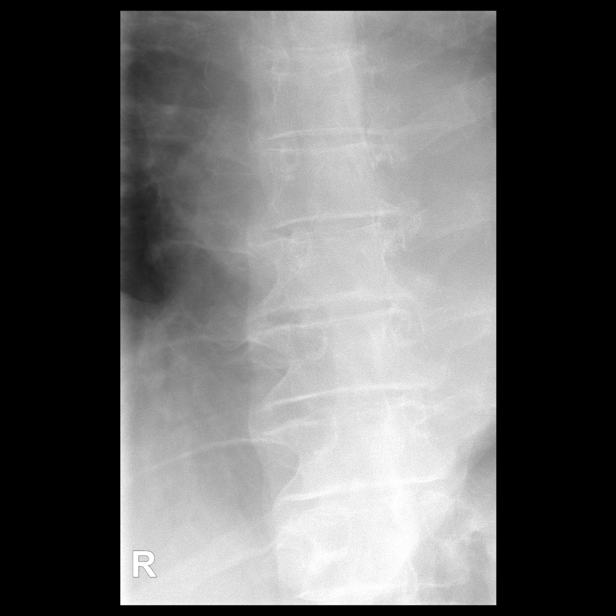

[Series 21: vasc adipose · 1 of 1 slices shown (10 of 10)]
[im 1/1]
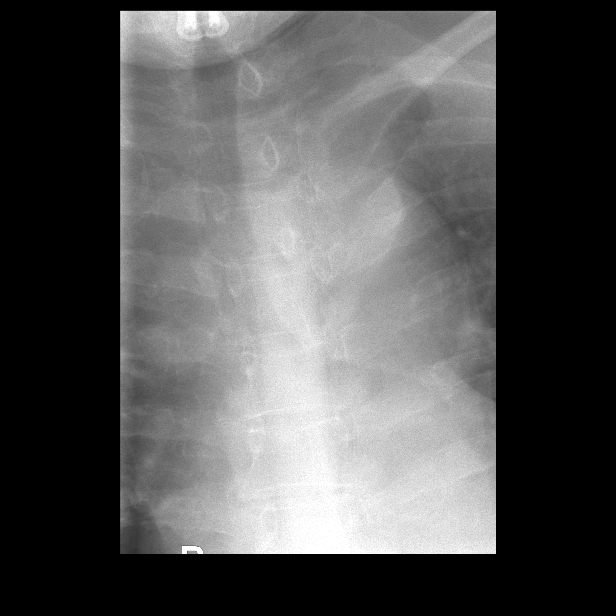

[12 of 24 positions shown; findings below may reference images not displayed]

FLUOROSCOPY TIME:  2 minutes and 27 seconds corresponding to a Dose
Area Product of [RY] ?Gy*m2

PROCEDURE:
LUMBAR PUNCTURE FOR CERVICAL LUMBAR AND THORACIC MYELOGRAM

CERVICAL AND LUMBAR AND THORACIC MYELOGRAM

CT CERVICAL MYELOGRAM

CT LUMBAR MYELOGRAM

CT THORACIC MYELOGRAM

After thorough discussion of risks and benefits of the procedure
including bleeding, infection, injury to nerves, blood vessels,
adjacent structures as well as headache and CSF leak, written and
oral informed consent was obtained. Consent was obtained by Dr. BRARI
BRARI.

Patient was positioned prone on the fluoroscopy table. Local
anesthesia was provided with 1% lidocaine without epinephrine after
prepped and draped in the usual sterile fashion. Puncture was
performed at L5-S1 using a 6 inch 22-gauge spinal needle via midline
approach. Using a single pass through the dura, the needle was
placed within the thecal sac, with return of clear CSF. 10 mL
[RY] was injected into the thecal sac, with normal
opacification of the nerve roots and cauda equina consistent with
free flow within the subarachnoid space. The patient was then moved
to the trendelenburg position and contrast flowed into the Thoracic
and Cervical spine regions.

I personally performed the lumbar puncture and administered the
intrathecal contrast. I also personally supervised acquisition of
the myelogram images.
FINDINGS: CERVICAL AND THORACIC AND LUMBAR MYELOGRAM FINDINGS:

LUMBAR: Degenerative scoliosis convex RIGHT approximately 10
degrees, mid lumbar region, related to asymmetric loss of interspace
height at L3-4 on the LEFT. LEFT L4 nerve root impingement is noted.
There is a severe compression fracture of L1, mildly retropulsed,
roughly 3 mm, with osseous spurring. Stenosis is present at L3-4,
LEFT greater than RIGHT, as well as at L1-2. There is compensatory
asymmetric loss of interspace height at L4-5 on the RIGHT. This
results in RIGHT L5 nerve root impingement.

Standing flexion extension demonstrates no dynamic instability.

THORACIC: No spinal stenosis or cord compression. Moderate ventral
defect at T8-9 related to central disc protrusion and osseous
spurring. Smaller ventral defects at T9-10 and T7-8. No intraspinal
mass lesion.

CERVICAL: Good opacification subarachnoid space. Previous C5-6 ACDF.
Hardware intact but no definite solid interbody arthrodesis. Slight
anterolisthesis C4-5 and C3-4, 1-2 mm.

CT CERVICAL MYELOGRAM FINDINGS:

Alignment: 2 mm anterolisthesis C4-5.  1 mm anterolisthesis C3-4.

Vertebrae: Unremarkable.

Cord: No cord compression or abnormal cord size.

Posterior Fossa: No tonsillar herniation.

Vertebral Arteries: Not assessed.

Paraspinal tissues: Unremarkable.

Disc levels:

C2-3:  Unremarkable.

C3-4: 1 mm anterolisthesis. Facet arthropathy. No definite
impingement.

C4-5: 2 mm anterolisthesis. Facet arthropathy. No definite
impingement.

C5-6: Hardware intact, but no solid interbody arthrodesis.
Subsidence of the cage into C6. Loss of interspace height. No
definite C6 foraminal narrowing.

C6-7: Disc space narrowing. Uncinate spurring to the RIGHT. RIGHT C7
foraminal narrowing.

C7-T1:  Unremarkable.

CT LUMBAR MYELOGRAM FINDINGS:

Segmentation: Normal.

Alignment: 4 mm retropulsion of L1 on L2 with osseous spurring.
Slight retrolisthesis L3-4 is facet mediated.

Vertebrae: No worrisome osseous lesion.Healed compression fracture
L1, near complete loss of height centrally.

Conus medullaris: Posteriorly displaced.

Paraspinal tissues: No evidence for hydronephrosis or paravertebral
mass.

Disc levels:

L1-L2: Central protrusion is partially calcified associated with
retropulsed bone. In addition there is leftward subluxation L1 on
L2. Marked osseous spurring to LEFT. Mild to moderate stenosis. LEFT
L1 and LEFT L2 nerve root impingement.

L2-L3: Central and leftward protrusion facets are unremarkable. LEFT
L3 nerve root impingement. Mild stenosis.

L3-L4: Asymmetric loss of interspace height. Vacuum phenomenon.
Central and leftward protrusion. Moderate facet disease, worse on
the LEFT. LEFT L3 and LEFT L4 nerve root impingement.

L4-L5: Asymmetric loss of interspace height to the RIGHT. Central
and rightward protrusion. Severe asymmetric posterior element
hypertrophy to the RIGHT. RIGHT L4 and RIGHT L5 nerve root
impingement.

L5-S1: Unremarkable disc space. Severe RIGHT and moderate LEFT facet
arthropathy. No subarticular zone or foraminal zone narrowing of
significance.

CT THORACIC MYELOGRAM FINDINGS:

The individual disc spaces are examined as follows:

T1-2:  Normal.

T2-3:  Normal.

T3-4:  Normal.

T4-5: Slight bulge. Slight superior endplate depression at T5. No
retropulsion.

T5-6: Slight bulge. Slight superior endplate depression at T6. No
retropulsion.

T6-7:  Annular bulge with spurring.  No impingement.

T7-8:  Annular bulge with spurring.  No impingement.

T8-9: Central and leftward protrusion with osteophytic spurring.
Mild posterior displacement of the cord without significant
stenosis. No foraminal narrowing.

T9-10:  Mild bulge.  No impingement.

T10-11:  Mild bulge.  No impingement.

T11-12: Unremarkable disc space. Ligamentum flavum calcification. No
impingement.

T12-L1: Posteriorly displaced L1 is accompanied by annular bulging.
No definite impingement.

Compared with most recent myelogram, [DATE], similar appearance
is noted.
IMPRESSION: LUMBAR: Chronic L1 compression fracture. Retropulsion of bone
results in posterior displacement conus without critical stenosis.

LEFT-sided neural impingement at L1-2, L2-3, and L3-4 as described
above. RIGHT-sided neural impingement at L4-5 is compensatory.

THORACIC: Multiple thoracic disc protrusions, most pronounced at
T8-9 central and to the LEFT. Similar appearance to priors. No
significant spinal stenosis or frank cord flattening.

CERVICAL: Status post C5-6 ACDF, with pseudarthrosis. Adjacent
segment disease at C6-7, with RIGHT-sided foraminal narrowing due to
uncinate spurring.

## 2017-02-16 IMAGING — CT CT T SPINE W/ CM
2 series · 10 of 14 positions shown, 12 images · non-contrast
Comparison: none

CLINICAL DATA: Low back pain.  Mid back pain.  Neck pain.
TECHNIQUE: Contiguous axial images were obtained through the Cervical,
Thoracic, and Lumbar spine after the intrathecal infusion of
infusion. Coronal and sagittal reconstructions were obtained of the
axial image sets.

[Series 3: cspine soft · axial · 0.32mm/px · z∈[+907,+1019]mm · 5 of 84 slices shown]
[im 14/84  soft-tissue]
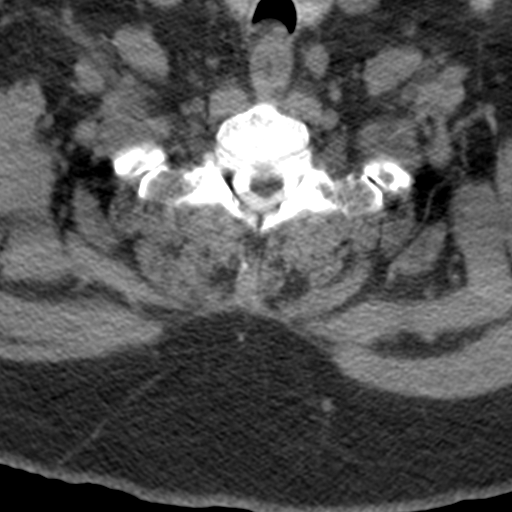
[im 28/84  soft-tissue]
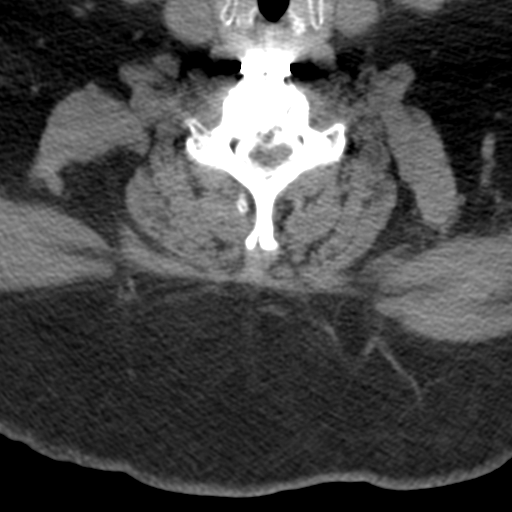
[im 42/84  soft-tissue]
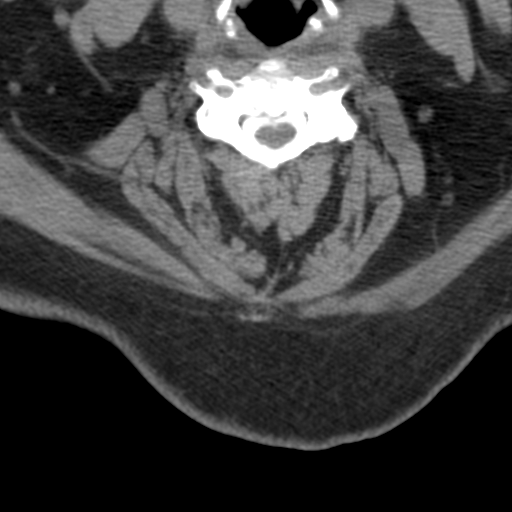
[im 56/84  soft-tissue]
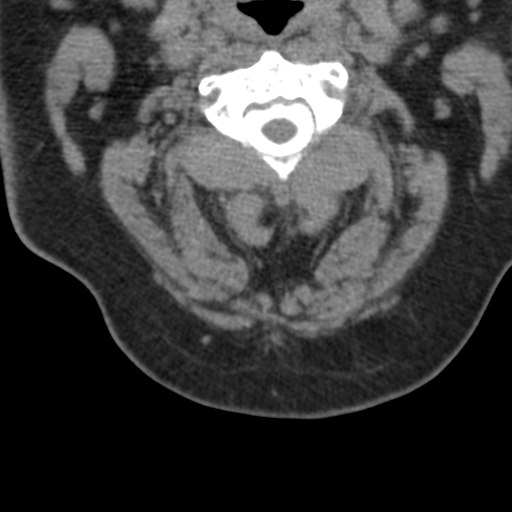
[im 70/84  soft-tissue]
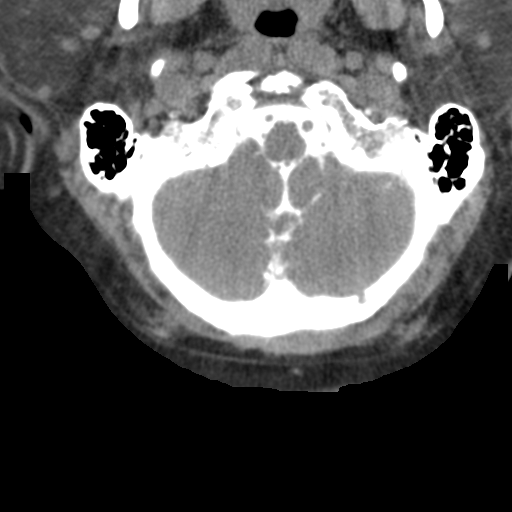

[Series 9: angled axial · axial · 0.20mm/px · z∈[+895,+1004]mm · 5 of 83 slices shown, 7 images]
[im 14/83  soft-tissue]
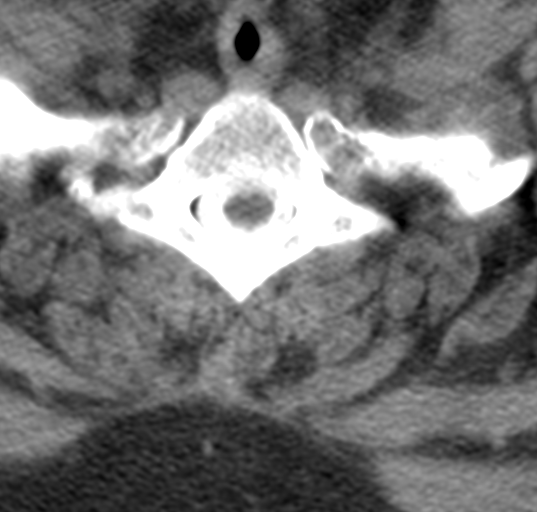
[im 14/83  bone]
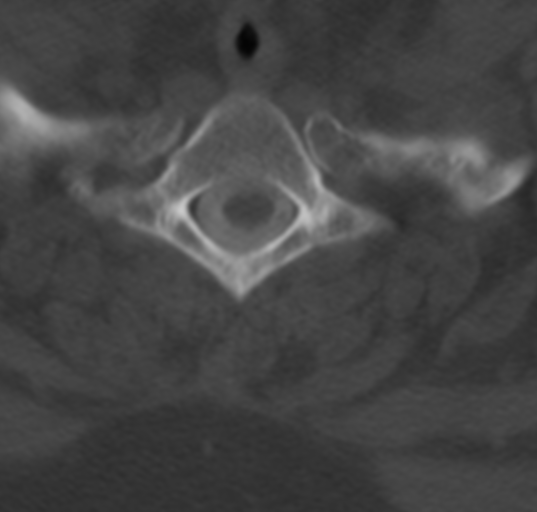
[im 28/83  bone]
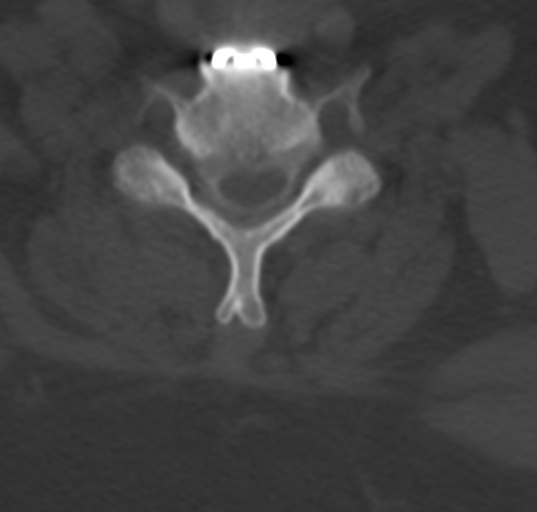
[im 42/83  bone]
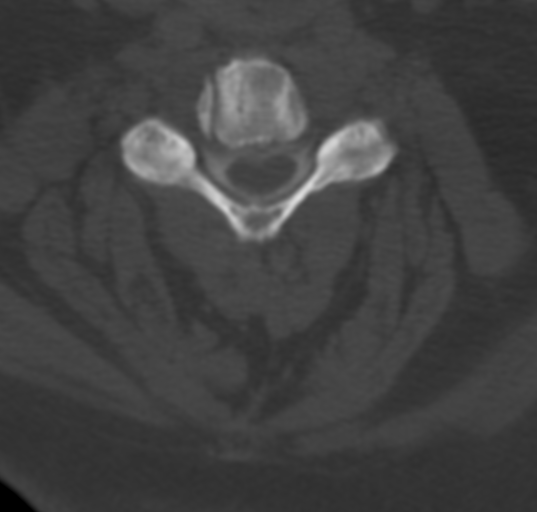
[im 55/83  bone]
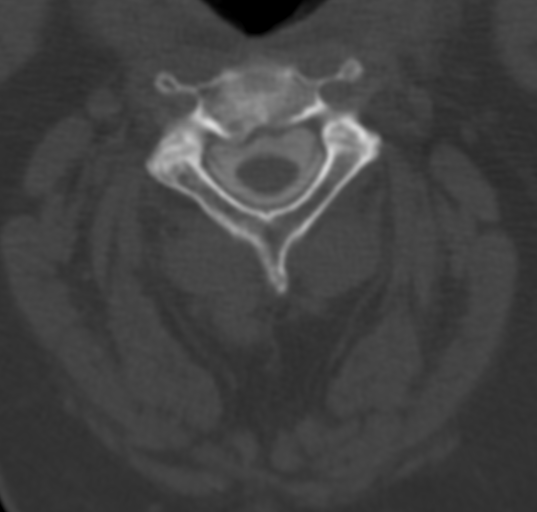
[im 69/83  soft-tissue]
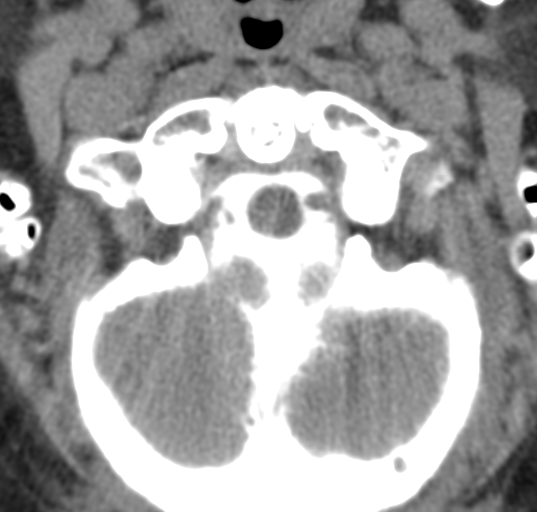
[im 69/83  bone]
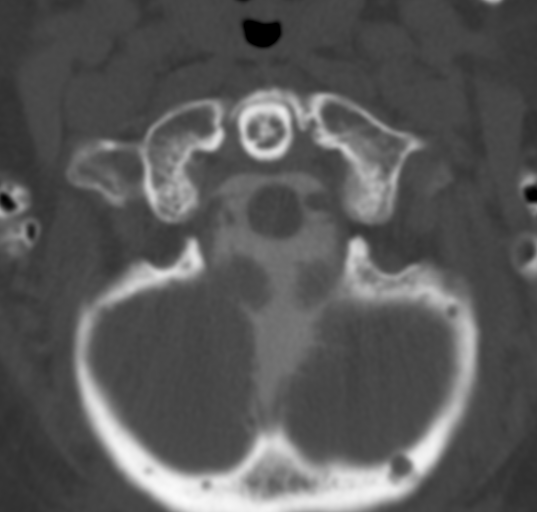

[10 of 14 positions shown; findings below may reference images not displayed]

FLUOROSCOPY TIME:  2 minutes and 27 seconds corresponding to a Dose
Area Product of [RY] ?Gy*m2

PROCEDURE:
LUMBAR PUNCTURE FOR CERVICAL LUMBAR AND THORACIC MYELOGRAM

CERVICAL AND LUMBAR AND THORACIC MYELOGRAM

CT CERVICAL MYELOGRAM

CT LUMBAR MYELOGRAM

CT THORACIC MYELOGRAM

After thorough discussion of risks and benefits of the procedure
including bleeding, infection, injury to nerves, blood vessels,
adjacent structures as well as headache and CSF leak, written and
oral informed consent was obtained. Consent was obtained by Dr. BRARI
BRARI.

Patient was positioned prone on the fluoroscopy table. Local
anesthesia was provided with 1% lidocaine without epinephrine after
prepped and draped in the usual sterile fashion. Puncture was
performed at L5-S1 using a 6 inch 22-gauge spinal needle via midline
approach. Using a single pass through the dura, the needle was
placed within the thecal sac, with return of clear CSF. 10 mL
[RY] was injected into the thecal sac, with normal
opacification of the nerve roots and cauda equina consistent with
free flow within the subarachnoid space. The patient was then moved
to the trendelenburg position and contrast flowed into the Thoracic
and Cervical spine regions.

I personally performed the lumbar puncture and administered the
intrathecal contrast. I also personally supervised acquisition of
the myelogram images.
FINDINGS: CERVICAL AND THORACIC AND LUMBAR MYELOGRAM FINDINGS:

LUMBAR: Degenerative scoliosis convex RIGHT approximately 10
degrees, mid lumbar region, related to asymmetric loss of interspace
height at L3-4 on the LEFT. LEFT L4 nerve root impingement is noted.
There is a severe compression fracture of L1, mildly retropulsed,
roughly 3 mm, with osseous spurring. Stenosis is present at L3-4,
LEFT greater than RIGHT, as well as at L1-2. There is compensatory
asymmetric loss of interspace height at L4-5 on the RIGHT. This
results in RIGHT L5 nerve root impingement.

Standing flexion extension demonstrates no dynamic instability.

THORACIC: No spinal stenosis or cord compression. Moderate ventral
defect at T8-9 related to central disc protrusion and osseous
spurring. Smaller ventral defects at T9-10 and T7-8. No intraspinal
mass lesion.

CERVICAL: Good opacification subarachnoid space. Previous C5-6 ACDF.
Hardware intact but no definite solid interbody arthrodesis. Slight
anterolisthesis C4-5 and C3-4, 1-2 mm.

CT CERVICAL MYELOGRAM FINDINGS:

Alignment: 2 mm anterolisthesis C4-5.  1 mm anterolisthesis C3-4.

Vertebrae: Unremarkable.

Cord: No cord compression or abnormal cord size.

Posterior Fossa: No tonsillar herniation.

Vertebral Arteries: Not assessed.

Paraspinal tissues: Unremarkable.

Disc levels:

C2-3:  Unremarkable.

C3-4: 1 mm anterolisthesis. Facet arthropathy. No definite
impingement.

C4-5: 2 mm anterolisthesis. Facet arthropathy. No definite
impingement.

C5-6: Hardware intact, but no solid interbody arthrodesis.
Subsidence of the cage into C6. Loss of interspace height. No
definite C6 foraminal narrowing.

C6-7: Disc space narrowing. Uncinate spurring to the RIGHT. RIGHT C7
foraminal narrowing.

C7-T1:  Unremarkable.

CT LUMBAR MYELOGRAM FINDINGS:

Segmentation: Normal.

Alignment: 4 mm retropulsion of L1 on L2 with osseous spurring.
Slight retrolisthesis L3-4 is facet mediated.

Vertebrae: No worrisome osseous lesion.Healed compression fracture
L1, near complete loss of height centrally.

Conus medullaris: Posteriorly displaced.

Paraspinal tissues: No evidence for hydronephrosis or paravertebral
mass.

Disc levels:

L1-L2: Central protrusion is partially calcified associated with
retropulsed bone. In addition there is leftward subluxation L1 on
L2. Marked osseous spurring to LEFT. Mild to moderate stenosis. LEFT
L1 and LEFT L2 nerve root impingement.

L2-L3: Central and leftward protrusion facets are unremarkable. LEFT
L3 nerve root impingement. Mild stenosis.

L3-L4: Asymmetric loss of interspace height. Vacuum phenomenon.
Central and leftward protrusion. Moderate facet disease, worse on
the LEFT. LEFT L3 and LEFT L4 nerve root impingement.

L4-L5: Asymmetric loss of interspace height to the RIGHT. Central
and rightward protrusion. Severe asymmetric posterior element
hypertrophy to the RIGHT. RIGHT L4 and RIGHT L5 nerve root
impingement.

L5-S1: Unremarkable disc space. Severe RIGHT and moderate LEFT facet
arthropathy. No subarticular zone or foraminal zone narrowing of
significance.

CT THORACIC MYELOGRAM FINDINGS:

The individual disc spaces are examined as follows:

T1-2:  Normal.

T2-3:  Normal.

T3-4:  Normal.

T4-5: Slight bulge. Slight superior endplate depression at T5. No
retropulsion.

T5-6: Slight bulge. Slight superior endplate depression at T6. No
retropulsion.

T6-7:  Annular bulge with spurring.  No impingement.

T7-8:  Annular bulge with spurring.  No impingement.

T8-9: Central and leftward protrusion with osteophytic spurring.
Mild posterior displacement of the cord without significant
stenosis. No foraminal narrowing.

T9-10:  Mild bulge.  No impingement.

T10-11:  Mild bulge.  No impingement.

T11-12: Unremarkable disc space. Ligamentum flavum calcification. No
impingement.

T12-L1: Posteriorly displaced L1 is accompanied by annular bulging.
No definite impingement.

Compared with most recent myelogram, [DATE], similar appearance
is noted.
IMPRESSION: LUMBAR: Chronic L1 compression fracture. Retropulsion of bone
results in posterior displacement conus without critical stenosis.

LEFT-sided neural impingement at L1-2, L2-3, and L3-4 as described
above. RIGHT-sided neural impingement at L4-5 is compensatory.

THORACIC: Multiple thoracic disc protrusions, most pronounced at
T8-9 central and to the LEFT. Similar appearance to priors. No
significant spinal stenosis or frank cord flattening.

CERVICAL: Status post C5-6 ACDF, with pseudarthrosis. Adjacent
segment disease at C6-7, with RIGHT-sided foraminal narrowing due to
uncinate spurring.

## 2017-02-16 IMAGING — CT CT T SPINE W/ CM
1 of 7 series · 5 of 14 positions shown, 7 images · non-contrast
Comparison: none

CLINICAL DATA: Low back pain.  Mid back pain.  Neck pain.
TECHNIQUE: Contiguous axial images were obtained through the Cervical,
Thoracic, and Lumbar spine after the intrathecal infusion of
infusion. Coronal and sagittal reconstructions were obtained of the
axial image sets.

[Series 3: l spine soft · axial · 0.29mm/px · z∈[+479,+632]mm · 5 of 77 slices shown, 7 images]
[im 13/77  soft-tissue]
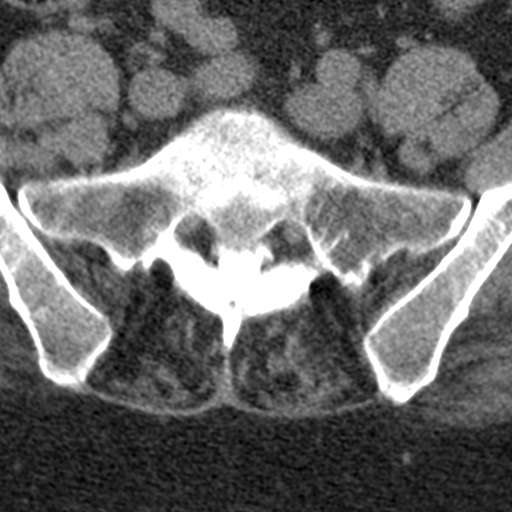
[im 13/77  bone]
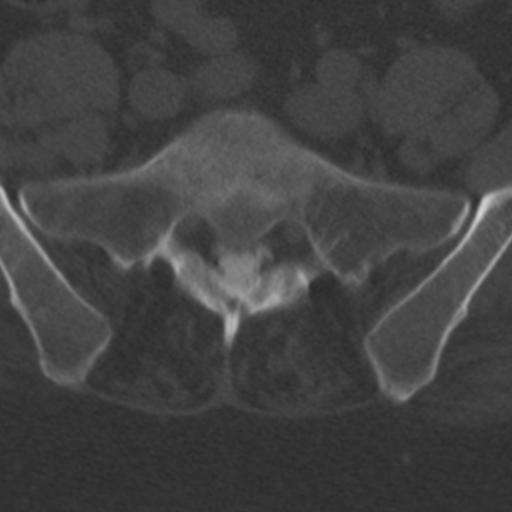
[im 26/77  bone]
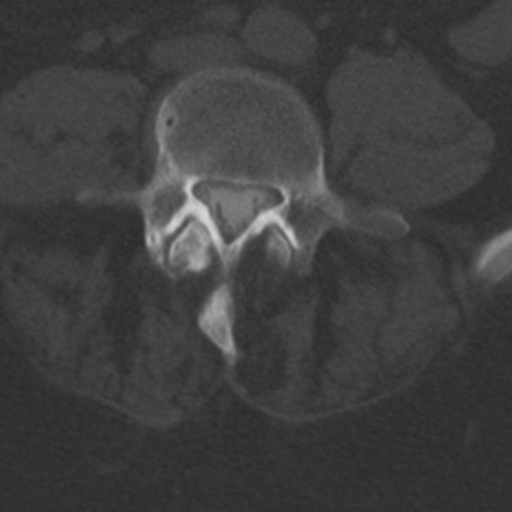
[im 39/77  bone]
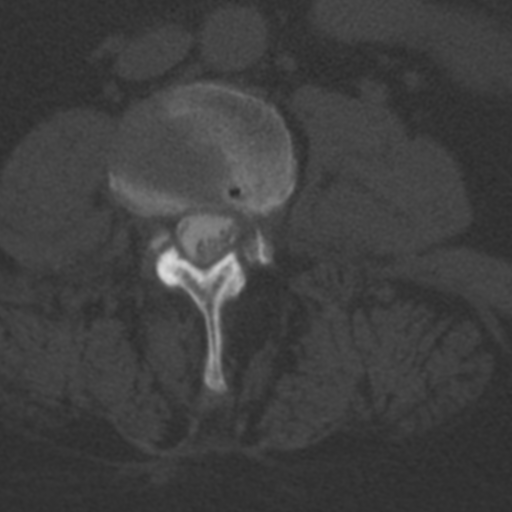
[im 51/77  bone]
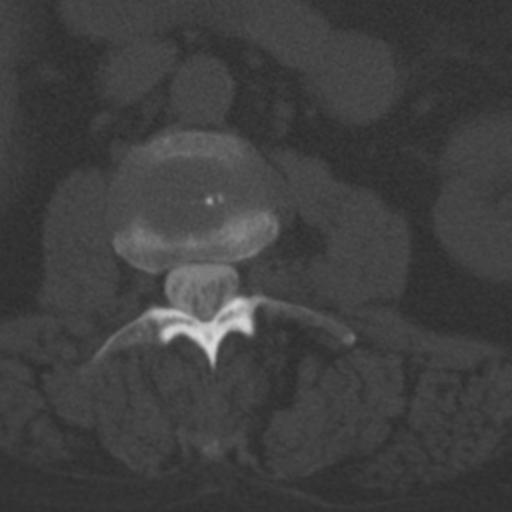
[im 64/77  soft-tissue]
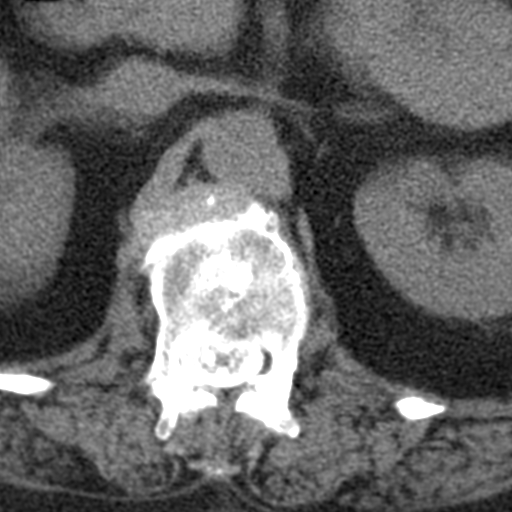
[im 64/77  bone]
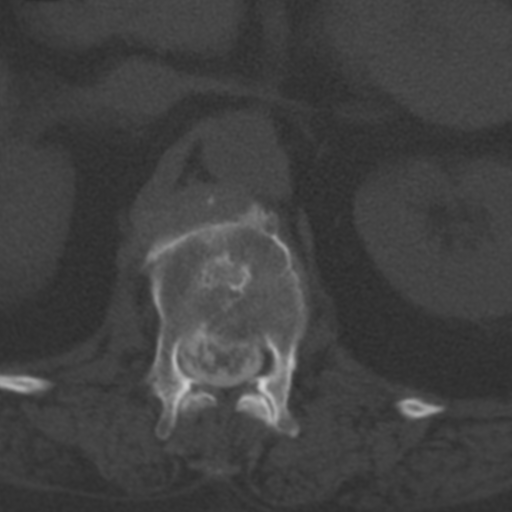

[5 of 14 positions shown; findings below may reference images not displayed]

FLUOROSCOPY TIME:  2 minutes and 27 seconds corresponding to a Dose
Area Product of [RY] ?Gy*m2

PROCEDURE:
LUMBAR PUNCTURE FOR CERVICAL LUMBAR AND THORACIC MYELOGRAM

CERVICAL AND LUMBAR AND THORACIC MYELOGRAM

CT CERVICAL MYELOGRAM

CT LUMBAR MYELOGRAM

CT THORACIC MYELOGRAM

After thorough discussion of risks and benefits of the procedure
including bleeding, infection, injury to nerves, blood vessels,
adjacent structures as well as headache and CSF leak, written and
oral informed consent was obtained. Consent was obtained by Dr. BRARI
BRARI.

Patient was positioned prone on the fluoroscopy table. Local
anesthesia was provided with 1% lidocaine without epinephrine after
prepped and draped in the usual sterile fashion. Puncture was
performed at L5-S1 using a 6 inch 22-gauge spinal needle via midline
approach. Using a single pass through the dura, the needle was
placed within the thecal sac, with return of clear CSF. 10 mL
[RY] was injected into the thecal sac, with normal
opacification of the nerve roots and cauda equina consistent with
free flow within the subarachnoid space. The patient was then moved
to the trendelenburg position and contrast flowed into the Thoracic
and Cervical spine regions.

I personally performed the lumbar puncture and administered the
intrathecal contrast. I also personally supervised acquisition of
the myelogram images.
FINDINGS: CERVICAL AND THORACIC AND LUMBAR MYELOGRAM FINDINGS:

LUMBAR: Degenerative scoliosis convex RIGHT approximately 10
degrees, mid lumbar region, related to asymmetric loss of interspace
height at L3-4 on the LEFT. LEFT L4 nerve root impingement is noted.
There is a severe compression fracture of L1, mildly retropulsed,
roughly 3 mm, with osseous spurring. Stenosis is present at L3-4,
LEFT greater than RIGHT, as well as at L1-2. There is compensatory
asymmetric loss of interspace height at L4-5 on the RIGHT. This
results in RIGHT L5 nerve root impingement.

Standing flexion extension demonstrates no dynamic instability.

THORACIC: No spinal stenosis or cord compression. Moderate ventral
defect at T8-9 related to central disc protrusion and osseous
spurring. Smaller ventral defects at T9-10 and T7-8. No intraspinal
mass lesion.

CERVICAL: Good opacification subarachnoid space. Previous C5-6 ACDF.
Hardware intact but no definite solid interbody arthrodesis. Slight
anterolisthesis C4-5 and C3-4, 1-2 mm.

CT CERVICAL MYELOGRAM FINDINGS:

Alignment: 2 mm anterolisthesis C4-5.  1 mm anterolisthesis C3-4.

Vertebrae: Unremarkable.

Cord: No cord compression or abnormal cord size.

Posterior Fossa: No tonsillar herniation.

Vertebral Arteries: Not assessed.

Paraspinal tissues: Unremarkable.

Disc levels:

C2-3:  Unremarkable.

C3-4: 1 mm anterolisthesis. Facet arthropathy. No definite
impingement.

C4-5: 2 mm anterolisthesis. Facet arthropathy. No definite
impingement.

C5-6: Hardware intact, but no solid interbody arthrodesis.
Subsidence of the cage into C6. Loss of interspace height. No
definite C6 foraminal narrowing.

C6-7: Disc space narrowing. Uncinate spurring to the RIGHT. RIGHT C7
foraminal narrowing.

C7-T1:  Unremarkable.

CT LUMBAR MYELOGRAM FINDINGS:

Segmentation: Normal.

Alignment: 4 mm retropulsion of L1 on L2 with osseous spurring.
Slight retrolisthesis L3-4 is facet mediated.

Vertebrae: No worrisome osseous lesion.Healed compression fracture
L1, near complete loss of height centrally.

Conus medullaris: Posteriorly displaced.

Paraspinal tissues: No evidence for hydronephrosis or paravertebral
mass.

Disc levels:

L1-L2: Central protrusion is partially calcified associated with
retropulsed bone. In addition there is leftward subluxation L1 on
L2. Marked osseous spurring to LEFT. Mild to moderate stenosis. LEFT
L1 and LEFT L2 nerve root impingement.

L2-L3: Central and leftward protrusion facets are unremarkable. LEFT
L3 nerve root impingement. Mild stenosis.

L3-L4: Asymmetric loss of interspace height. Vacuum phenomenon.
Central and leftward protrusion. Moderate facet disease, worse on
the LEFT. LEFT L3 and LEFT L4 nerve root impingement.

L4-L5: Asymmetric loss of interspace height to the RIGHT. Central
and rightward protrusion. Severe asymmetric posterior element
hypertrophy to the RIGHT. RIGHT L4 and RIGHT L5 nerve root
impingement.

L5-S1: Unremarkable disc space. Severe RIGHT and moderate LEFT facet
arthropathy. No subarticular zone or foraminal zone narrowing of
significance.

CT THORACIC MYELOGRAM FINDINGS:

The individual disc spaces are examined as follows:

T1-2:  Normal.

T2-3:  Normal.

T3-4:  Normal.

T4-5: Slight bulge. Slight superior endplate depression at T5. No
retropulsion.

T5-6: Slight bulge. Slight superior endplate depression at T6. No
retropulsion.

T6-7:  Annular bulge with spurring.  No impingement.

T7-8:  Annular bulge with spurring.  No impingement.

T8-9: Central and leftward protrusion with osteophytic spurring.
Mild posterior displacement of the cord without significant
stenosis. No foraminal narrowing.

T9-10:  Mild bulge.  No impingement.

T10-11:  Mild bulge.  No impingement.

T11-12: Unremarkable disc space. Ligamentum flavum calcification. No
impingement.

T12-L1: Posteriorly displaced L1 is accompanied by annular bulging.
No definite impingement.

Compared with most recent myelogram, [DATE], similar appearance
is noted.
IMPRESSION: LUMBAR: Chronic L1 compression fracture. Retropulsion of bone
results in posterior displacement conus without critical stenosis.

LEFT-sided neural impingement at L1-2, L2-3, and L3-4 as described
above. RIGHT-sided neural impingement at L4-5 is compensatory.

THORACIC: Multiple thoracic disc protrusions, most pronounced at
T8-9 central and to the LEFT. Similar appearance to priors. No
significant spinal stenosis or frank cord flattening.

CERVICAL: Status post C5-6 ACDF, with pseudarthrosis. Adjacent
segment disease at C6-7, with RIGHT-sided foraminal narrowing due to
uncinate spurring.

## 2017-02-16 MED ORDER — ONDANSETRON 8 MG PO TBDP
8.0000 mg | ORAL_TABLET | Freq: Once | ORAL | Status: AC
  Administered 2017-02-16: 8 mg via ORAL

## 2017-02-16 MED ORDER — HYDROXYZINE HCL 50 MG/ML IM SOLN
25.0000 mg | Freq: Once | INTRAMUSCULAR | Status: AC
  Administered 2017-02-16: 25 mg via INTRAMUSCULAR

## 2017-02-16 MED ORDER — DIAZEPAM 5 MG PO TABS
10.0000 mg | ORAL_TABLET | Freq: Once | ORAL | Status: AC
  Administered 2017-02-16: 10 mg via ORAL

## 2017-02-16 MED ORDER — MEPERIDINE HCL 100 MG/ML IJ SOLN
75.0000 mg | Freq: Once | INTRAMUSCULAR | Status: AC
  Administered 2017-02-16: 50 mg via INTRAMUSCULAR

## 2017-02-16 MED ORDER — IOPAMIDOL (ISOVUE-M 300) INJECTION 61%
10.0000 mL | Freq: Once | INTRAMUSCULAR | Status: AC | PRN
  Administered 2017-02-16: 10 mL via INTRATHECAL

## 2017-02-16 NOTE — Discharge Instructions (Signed)
Myelogram Discharge Instructions  1. Go home and rest quietly for the next 24 hours.  It is important to lie flat for the next 24 hours.  Get up only to go to the restroom.  You may lie in the bed or on a couch on your back, your stomach, your left side or your right side.  You may have one pillow under your head.  You may have pillows between your knees while you are on your side or under your knees while you are on your back.  2. DO NOT drive today.  Recline the seat as far back as it will go, while still wearing your seat belt, on the way home.  3. You may get up to go to the bathroom as needed.  You may sit up for 10 minutes to eat.  You may resume your normal diet and medications unless otherwise indicated.  Drink lots of extra fluids today and tomorrow.  4. The incidence of headache, nausea, or vomiting is about 5% (one in 20 patients).  If you develop a headache, lie flat and drink plenty of fluids until the headache goes away.  Caffeinated beverages may be helpful.  If you develop severe nausea and vomiting or a headache that does not go away with flat bed rest, call [phone].  5. You may resume normal activities after your 24 hours of bed rest is over; however, do not exert yourself strongly or do any heavy lifting tomorrow. If when you get up you have a headache when standing, go back to bed and force fluids for another 24 hours.  6. Call your physician for a follow-up appointment.  The results of your myelogram will be sent directly to your physician by the following day.  7. If you have any questions or if complications develop after you arrive home, please call [phone].  Discharge instructions have been explained to the patient.  The patient, or the person responsible for the patient, fully understands these instructions.    Myelogram Discharge Instructions  8. Go home and rest quietly for the next 24 hours.  It is important to lie flat for the next 24 hours.  Get up only to  go to the restroom.  You may lie in the bed or on a couch on your back, your stomach, your left side or your right side.  You may have one pillow under your head.  You may have pillows between your knees while you are on your side or under your knees while you are on your back.  9. DO NOT drive today.  Recline the seat as far back as it will go, while still wearing your seat belt, on the way home.  10. You may get up to go to the bathroom as needed.  You may sit up for 10 minutes to eat.  You may resume your normal diet and medications unless otherwise indicated.  Drink lots of extra fluids today and tomorrow.  11. The incidence of headache, nausea, or vomiting is about 5% (one in 20 patients).  If you develop a headache, lie flat and drink plenty of fluids until the headache goes away.  Caffeinated beverages may be helpful.  If you develop severe nausea and vomiting or a headache that does not go away with flat bed rest, call [phone].  12. You may resume normal activities after your 24 hours of bed rest is over; however, do not exert yourself strongly or do any heavy lifting tomorrow. If  when you get up you have a headache when standing, go back to bed and force fluids for another 24 hours.  13. Call your physician for a follow-up appointment.  The results of your myelogram will be sent directly to your physician by the following day.  14. If you have any questions or if complications develop after you arrive home, please call [phone].  Discharge instructions have been explained to the patient.  The patient, or the person responsible for the patient, fully understands these instructions.    Myelogram Discharge Instructions  15. Go home and rest quietly for the next 24 hours.  It is important to lie flat for the next 24 hours.  Get up only to go to the restroom.  You may lie in the bed or on a couch on your back, your stomach, your left side or your right side.  You may have one pillow  under your head.  You may have pillows between your knees while you are on your side or under your knees while you are on your back.  16. DO NOT drive today.  Recline the seat as far back as it will go, while still wearing your seat belt, on the way home.  17. You may get up to go to the bathroom as needed.  You may sit up for 10 minutes to eat.  You may resume your normal diet and medications unless otherwise indicated.  Drink lots of extra fluids today and tomorrow.  18. The incidence of headache, nausea, or vomiting is about 5% (one in 20 patients).  If you develop a headache, lie flat and drink plenty of fluids until the headache goes away.  Caffeinated beverages may be helpful.  If you develop severe nausea and vomiting or a headache that does not go away with flat bed rest, call [phone].  19. You may resume normal activities after your 24 hours of bed rest is over; however, do not exert yourself strongly or do any heavy lifting tomorrow. If when you get up you have a headache when standing, go back to bed and force fluids for another 24 hours.  20. Call your physician for a follow-up appointment.  The results of your myelogram will be sent directly to your physician by the following day.  21. If you have any questions or if complications develop after you arrive home, please call [phone].  Discharge instructions have been explained to the patient.  The patient, or the person responsible for the patient, fully understands these instructions.      May resume Celexa on February 17, 2017, after 9:30 am.

## 2017-02-16 NOTE — Progress Notes (Signed)
Pt states she has been off Celexa since Saturday.

## 2017-03-04 ENCOUNTER — Ambulatory Visit
Admission: RE | Admit: 2017-03-04 | Discharge: 2017-03-04 | Disposition: A | Discharge status: Home / Self Care | Payer: Medicare Other | Source: Ambulatory Visit | Attending: Family Medicine | Admitting: Family Medicine

## 2017-03-04 DIAGNOSIS — Z1231 Encounter for screening mammogram for malignant neoplasm of breast: Secondary | ICD-10-CM

## 2017-03-04 IMAGING — MG 2D DIGITAL SCREENING BILATERAL MAMMOGRAM WITH CAD AND ADJUNCT TO
8 of 13 series · 8 of 29 positions shown · non-contrast
Comparison: Previous exam(s).

CLINICAL DATA: Screening.

EXAM:
2D DIGITAL SCREENING BILATERAL MAMMOGRAM WITH CAD AND ADJUNCT TOMO

[L CC (1 of 2)]
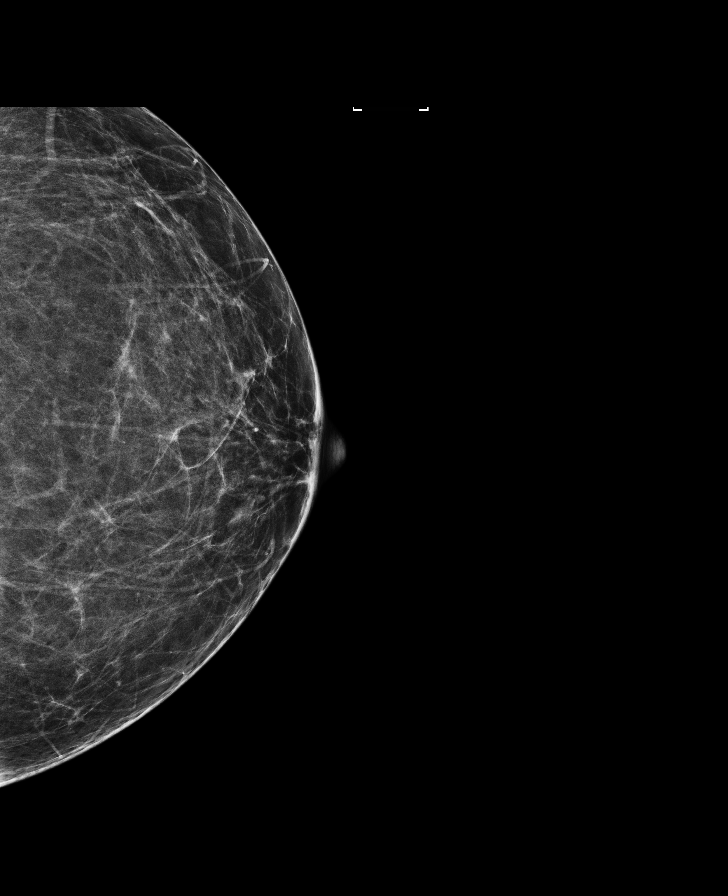

[R CC]
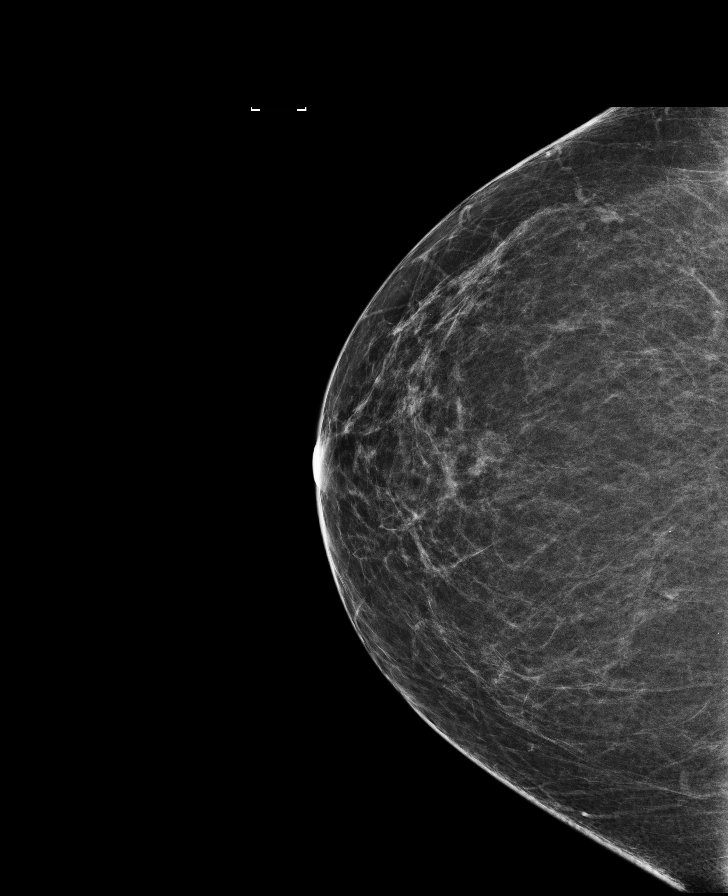

[L CC synth-2D]
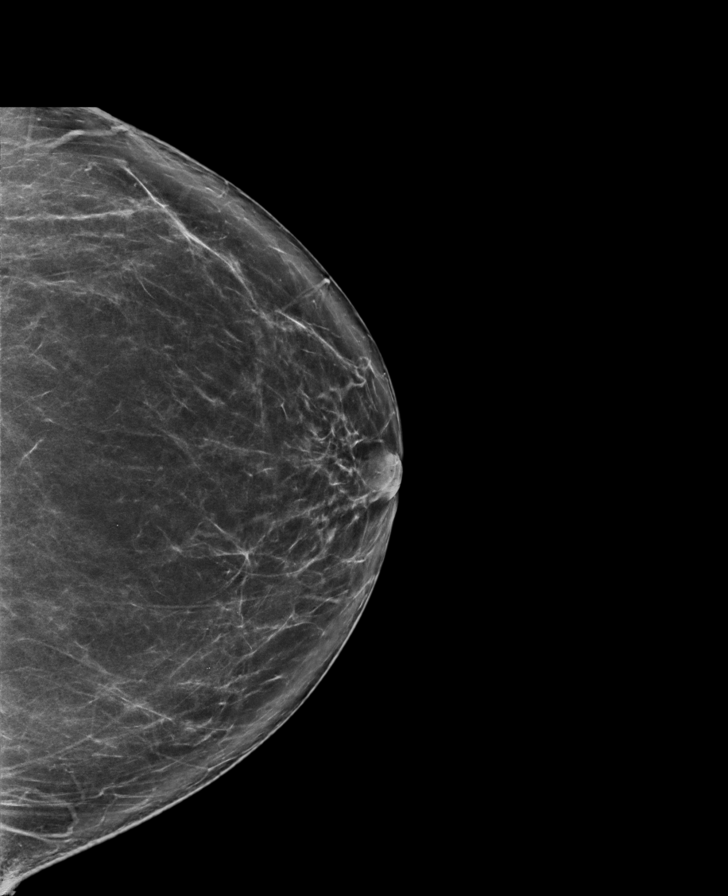

[L CC (2 of 2)]
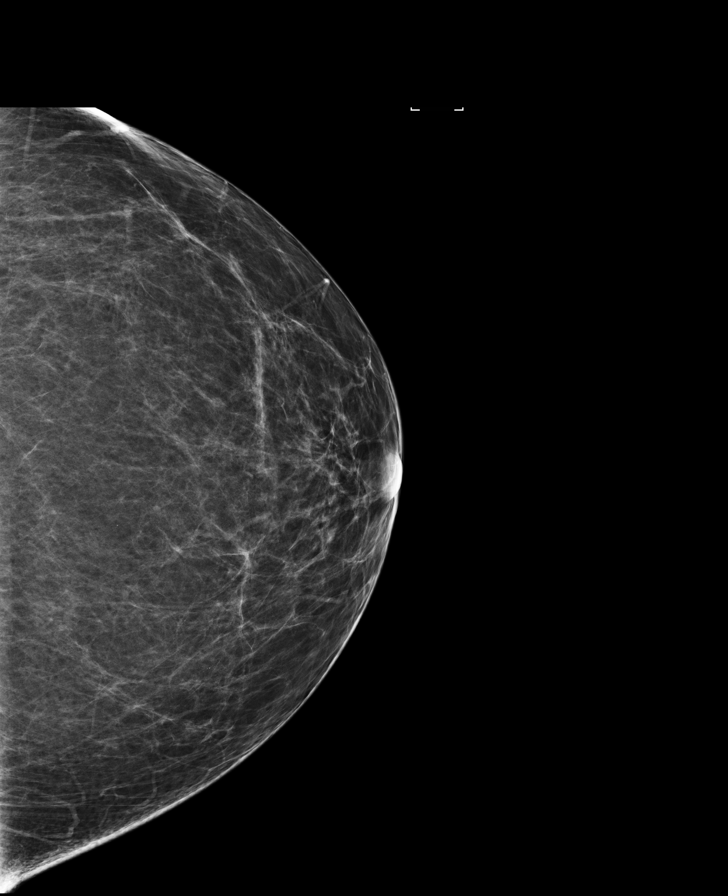

[R MLO]
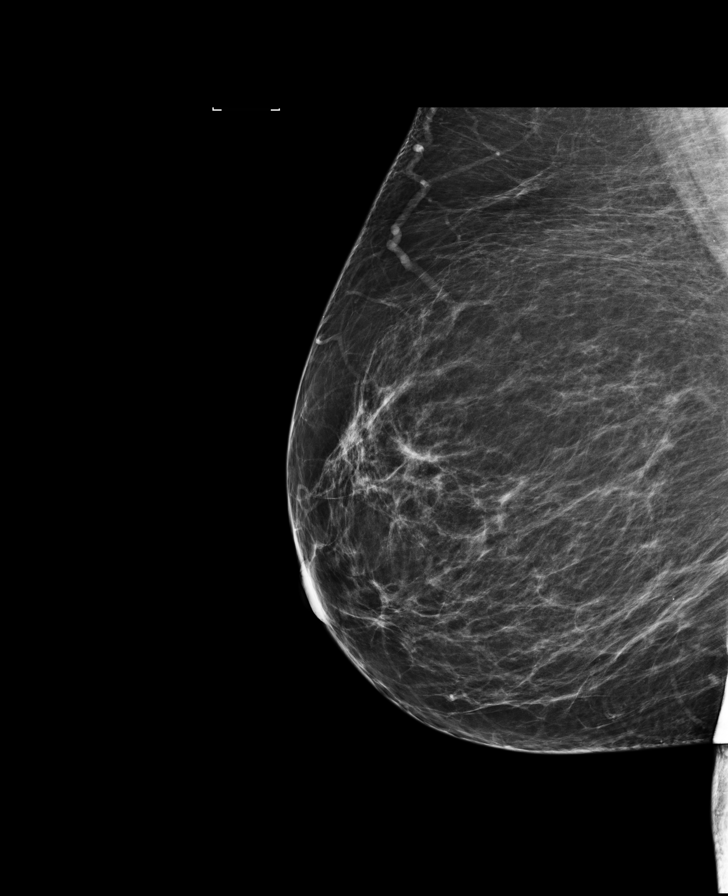

[L MLO]
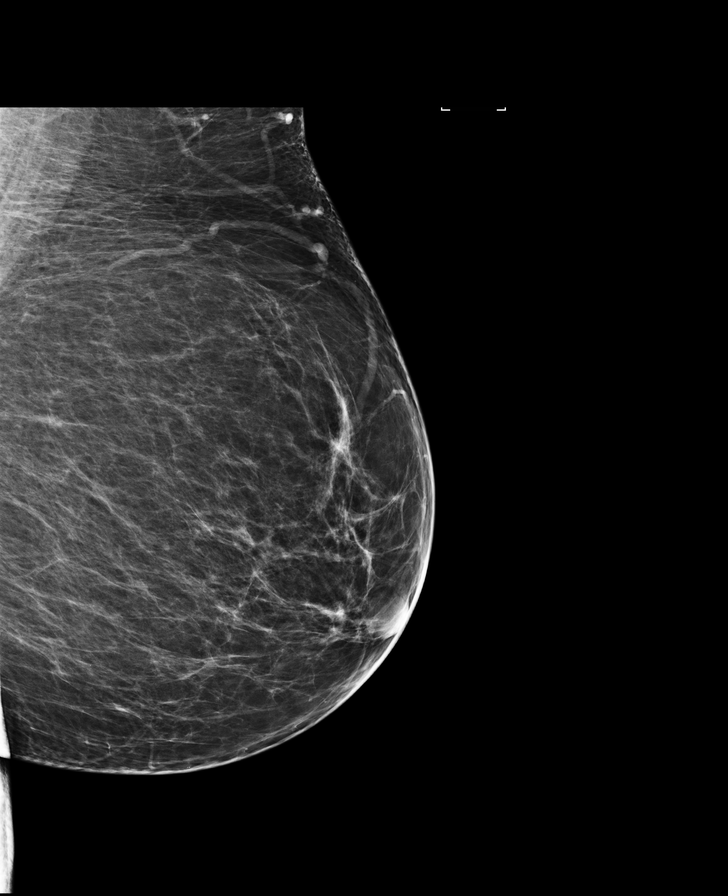

[R CC synth-2D]
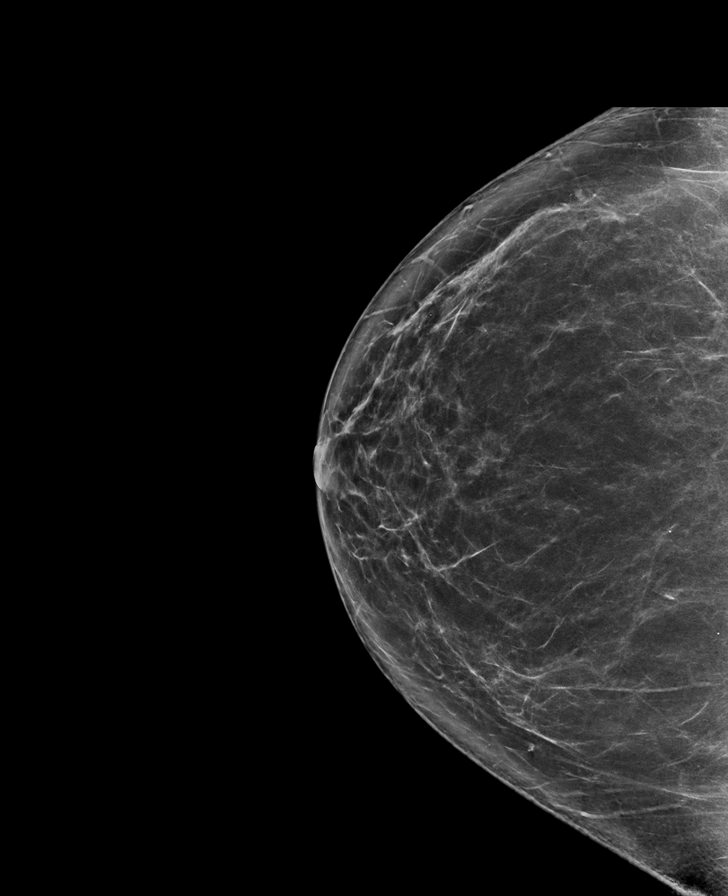

[L MLO synth-2D]
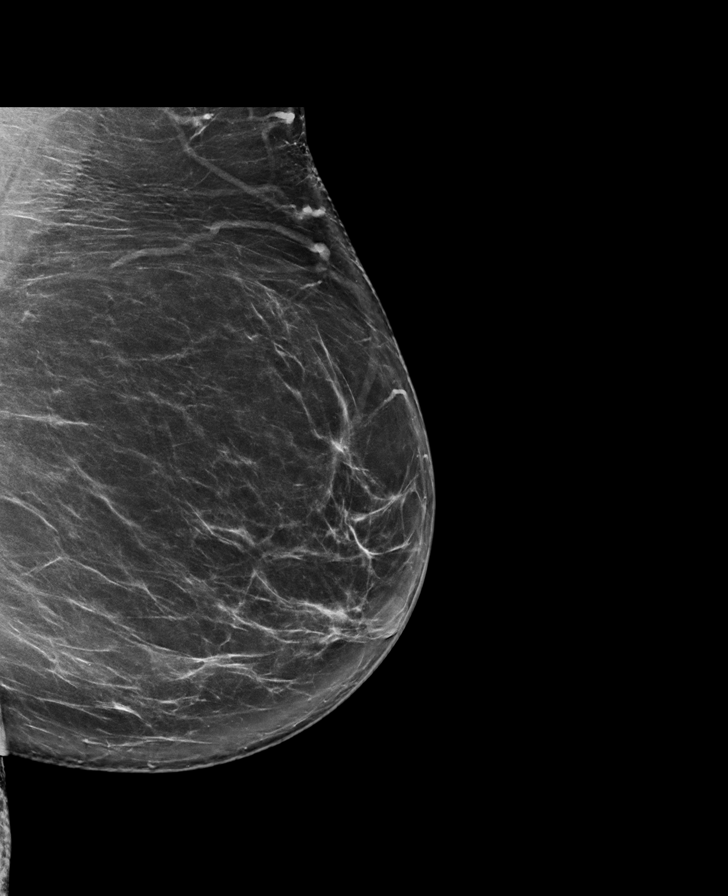

[8 of 29 positions shown; findings below may reference images not displayed]

ACR Breast Density Category b: There are scattered areas of
fibroglandular density.
FINDINGS: There are no findings suspicious for malignancy. Images were
processed with CAD.
IMPRESSION: No mammographic evidence of malignancy. A result letter of this
screening mammogram will be mailed directly to the patient.

RECOMMENDATION:
Screening mammogram in one year. (Code:[33])

BI-RADS CATEGORY  1: Negative.

## 2017-04-05 DIAGNOSIS — M542 Cervicalgia: Secondary | ICD-10-CM | POA: Insufficient documentation

## 2017-04-05 DIAGNOSIS — Z981 Arthrodesis status: Secondary | ICD-10-CM | POA: Insufficient documentation

## 2017-04-05 DIAGNOSIS — Z9889 Other specified postprocedural states: Secondary | ICD-10-CM | POA: Insufficient documentation

## 2017-04-05 DIAGNOSIS — G8929 Other chronic pain: Secondary | ICD-10-CM | POA: Insufficient documentation

## 2017-06-07 ENCOUNTER — Other Ambulatory Visit
Admission: RE | Admit: 2017-06-07 | Discharge: 2017-06-07 | Disposition: A | Discharge status: Home / Self Care | Payer: Medicare Other | Source: Ambulatory Visit | Attending: Ophthalmology | Admitting: Ophthalmology

## 2017-06-07 DIAGNOSIS — H209 Unspecified iridocyclitis: Secondary | ICD-10-CM | POA: Diagnosis present

## 2017-06-07 LAB — CBC WITH DIFFERENTIAL/PLATELET
BASOS ABS: 0 10*3/uL (ref 0–0.1)
Basophils Relative: 1 %
EOS PCT: 2 %
Eosinophils Absolute: 0.1 10*3/uL (ref 0–0.7)
HCT: 38.8 % (ref 35.0–47.0)
HEMOGLOBIN: 13.1 g/dL (ref 12.0–16.0)
LYMPHS ABS: 1.8 10*3/uL (ref 1.0–3.6)
LYMPHS PCT: 24 %
MCH: 28.2 pg (ref 26.0–34.0)
MCHC: 33.6 g/dL (ref 32.0–36.0)
MCV: 83.9 fL (ref 80.0–100.0)
Monocytes Absolute: 0.4 10*3/uL (ref 0.2–0.9)
Monocytes Relative: 5 %
NEUTROS PCT: 68 %
Neutro Abs: 5.2 10*3/uL (ref 1.4–6.5)
PLATELETS: 315 10*3/uL (ref 150–440)
RBC: 4.63 MIL/uL (ref 3.80–5.20)
RDW: 15.3 % — ABNORMAL HIGH (ref 11.5–14.5)
WBC: 7.6 10*3/uL (ref 3.6–11.0)

## 2017-06-08 LAB — ANTINUCLEAR ANTIBODIES, IFA: ANA Ab, IFA: NEGATIVE

## 2017-06-08 LAB — ANGIOTENSIN CONVERTING ENZYME: Angiotensin-Converting Enzyme: 17 U/L (ref 14–82)

## 2017-06-08 LAB — RHEUMATOID FACTOR

## 2017-06-10 LAB — HLA-B27 ANTIGEN: HLA-B27: NEGATIVE

## 2017-12-13 DIAGNOSIS — Z79899 Other long term (current) drug therapy: Secondary | ICD-10-CM | POA: Insufficient documentation

## 2017-12-13 DIAGNOSIS — H30033 Focal chorioretinal inflammation, peripheral, bilateral: Secondary | ICD-10-CM | POA: Insufficient documentation

## 2017-12-13 DIAGNOSIS — H44113 Panuveitis, bilateral: Secondary | ICD-10-CM | POA: Insufficient documentation

## 2017-12-13 DIAGNOSIS — H21542 Posterior synechiae (iris), left eye: Secondary | ICD-10-CM | POA: Insufficient documentation

## 2017-12-13 DIAGNOSIS — H2513 Age-related nuclear cataract, bilateral: Secondary | ICD-10-CM | POA: Insufficient documentation

## 2017-12-13 DIAGNOSIS — H3581 Retinal edema: Secondary | ICD-10-CM | POA: Insufficient documentation

## 2018-02-20 ENCOUNTER — Ambulatory Visit (INDEPENDENT_AMBULATORY_CARE_PROVIDER_SITE_OTHER): Payer: Medicare Other | Admitting: Podiatry

## 2018-02-20 ENCOUNTER — Encounter: Payer: Self-pay | Admitting: Podiatry

## 2018-02-20 DIAGNOSIS — M722 Plantar fascial fibromatosis: Secondary | ICD-10-CM | POA: Diagnosis not present

## 2018-02-20 DIAGNOSIS — G8929 Other chronic pain: Secondary | ICD-10-CM

## 2018-02-20 DIAGNOSIS — M779 Enthesopathy, unspecified: Secondary | ICD-10-CM | POA: Diagnosis not present

## 2018-02-20 DIAGNOSIS — M25572 Pain in left ankle and joints of left foot: Secondary | ICD-10-CM

## 2018-02-21 ENCOUNTER — Telehealth: Payer: Self-pay | Admitting: *Deleted

## 2018-02-21 DIAGNOSIS — M779 Enthesopathy, unspecified: Secondary | ICD-10-CM

## 2018-02-21 DIAGNOSIS — M722 Plantar fascial fibromatosis: Secondary | ICD-10-CM

## 2018-02-21 DIAGNOSIS — G8929 Other chronic pain: Secondary | ICD-10-CM

## 2018-02-21 DIAGNOSIS — M25572 Pain in left ankle and joints of left foot: Secondary | ICD-10-CM

## 2018-02-21 NOTE — Progress Notes (Signed)
Subjective: 57 year old female presents the office today with concerns of continued bilateral heel pain complaint that she has and she is requesting injections.  She has previously had left ankle pain last couple years and she has had previous x-rays which were normal.  She states that she had an injection of the ankle couple years ago and that did not help at all she had ongoing ankle pain and she wants to get MRI of the left ankle to make sure there is nothing else is going on.she denies any recent injury.   Denies any systemic complaints such as fevers, chills, nausea, vomiting. No acute changes since last appointment, and no other complaints at this time.   Objective: AAO x3, NAD DP/PT pulses palpable bilaterally, CRT less than 3 seconds There is tenderness palpation of the plantar medial tubercle of the calcaneus at the insertion of plantar fascia bilaterally.  Plantar fascia appears to be intact.  There is no pain with lateral compression of calcaneus.  There is tenderness in the left ankle on the anterior medial ankle joint line as well as the sinus tarsi.  There is noticed significant discomfort with ankle joint range of motion.  No significant edema, erythema, increase in warmth.  There is also tenderness along the right sinus tarsi.  Overall her symptoms are unchanged is been ongoing and chronic issue.  No open lesions or pre-ulcerative lesions.  No pain with calf compression, swelling, warmth, erythema  Assessment: Left chronic ankle pain, plantar fasciitis  Plan: -All treatment options discussed with the patient including all alternatives, risks, complications.  -At this point given her continued left ankle pain and despite having injection previously she had no relief.  At this point would order an MRI of the left ankle to rule out osteochondral lesion or ligament injury.  X-rays previously were negative there is been no change in symptoms or injury since her last x-rays. Steroid  injections performed bilateral plantar fascia.  See procedure note below.  Continue stretching, icing.  She has a night splint at home I want her to continue with this well.- -Patient encouraged to call the office with any questions, concerns, change in symptoms.   Procedure: Injection Tendon/Ligament Discussed alternatives, risks, complications and verbal consent was obtained.  Location: Bilateral plantar fascia at the glabrous junction; medial approach. Skin Prep: Alcohol. Injectate: 0.5cc 0.5% marcaine plain, 0.5 cc 2% lidocaine plain and, 1 cc kenalog 10. Disposition: Patient tolerated procedure well. Injection site dressed with a band-aid.  Post-injection care was discussed and return precautions discussed.   Trula Slade DPM

## 2018-02-21 NOTE — Telephone Encounter (Signed)
-----   Message from Trula Slade, DPM sent at 02/21/2018  7:57 AM EDT ----- Can you please order an MRI of the LEFT ankle to rule out an osteochondral lesion or ligament injury. She has had ankle pain for several years with no improvement despite bracing, injections, shoe changes. Thanks.

## 2018-02-21 NOTE — Telephone Encounter (Signed)
Faxed orders to [facility]. 

## 2018-02-24 ENCOUNTER — Other Ambulatory Visit: Payer: Self-pay | Admitting: Family Medicine

## 2018-02-24 DIAGNOSIS — Z1231 Encounter for screening mammogram for malignant neoplasm of breast: Secondary | ICD-10-CM

## 2018-03-18 ENCOUNTER — Other Ambulatory Visit: Payer: Medicare Other

## 2018-03-21 ENCOUNTER — Ambulatory Visit: Payer: Medicare Other

## 2018-04-21 ENCOUNTER — Ambulatory Visit
Admission: RE | Admit: 2018-04-21 | Discharge: 2018-04-21 | Disposition: A | Discharge status: Home / Self Care | Payer: Medicare Other | Source: Ambulatory Visit | Attending: Family Medicine | Admitting: Family Medicine

## 2018-04-21 DIAGNOSIS — Z1231 Encounter for screening mammogram for malignant neoplasm of breast: Secondary | ICD-10-CM

## 2018-04-21 IMAGING — MG DIGITAL SCREENING BILATERAL MAMMOGRAM WITH TOMO AND CAD
8 series · 8 of 24 positions shown · non-contrast
Comparison: Previous exam(s).

CLINICAL DATA: Screening.

EXAM:
DIGITAL SCREENING BILATERAL MAMMOGRAM WITH TOMO AND CAD

[R MLO synth-2D]
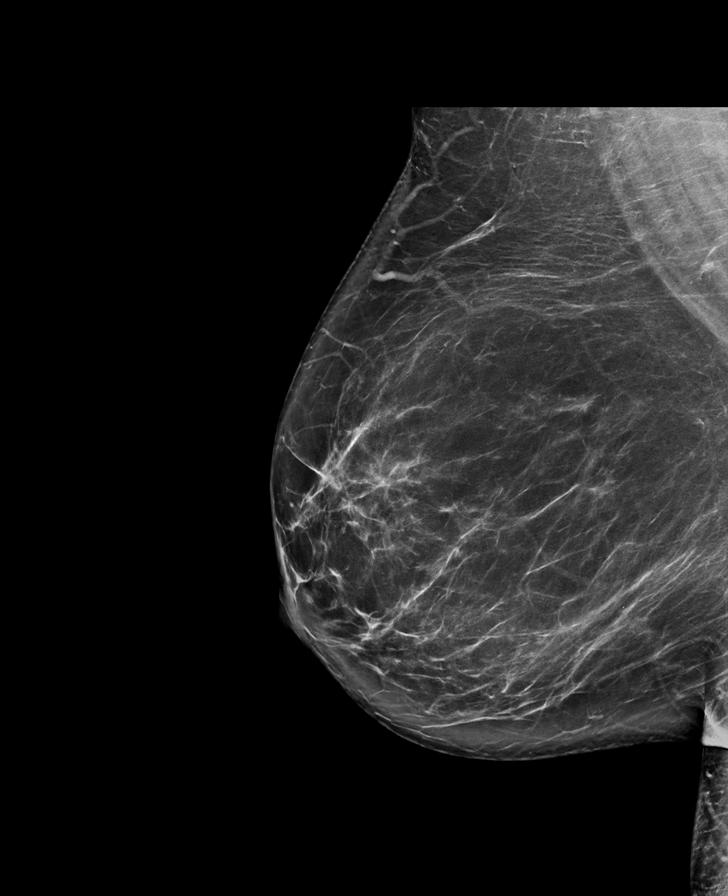

[L CC synth-2D]
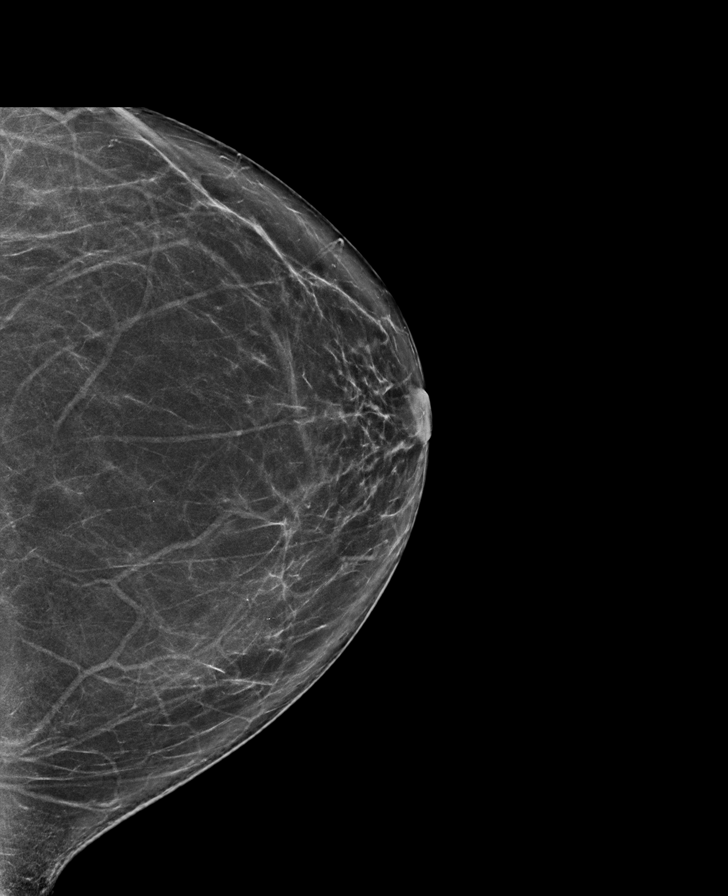

[R CC synth-2D]
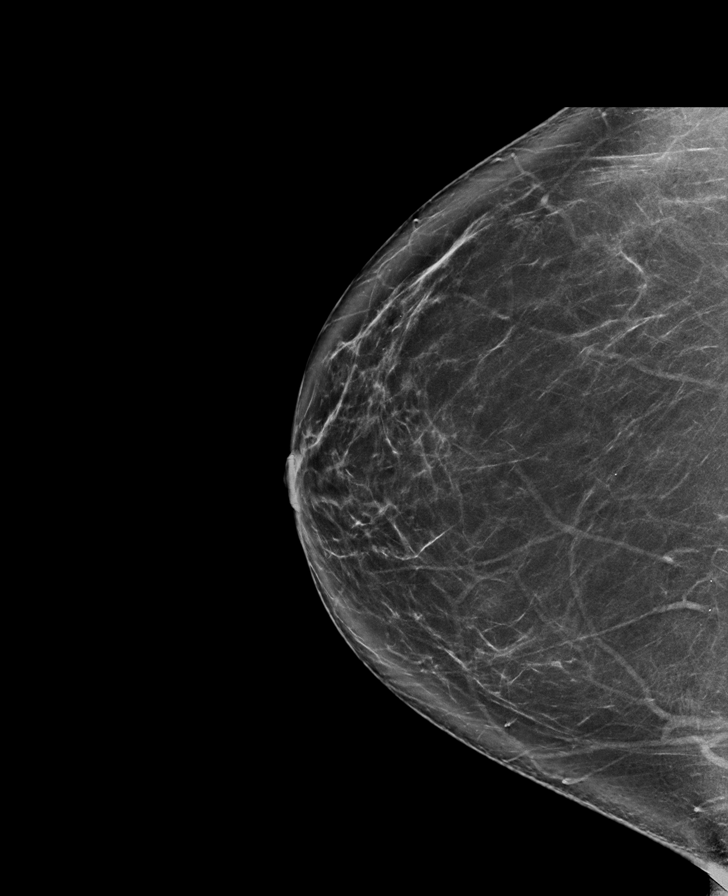

[L MLO synth-2D]
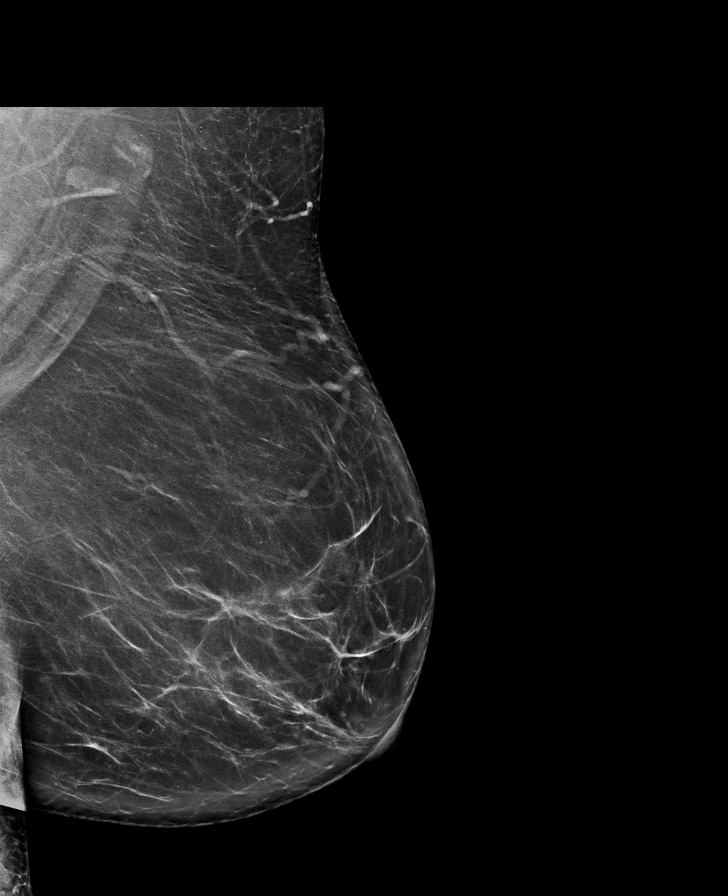

[R MLO tomo · tomo slice 46/91.0]
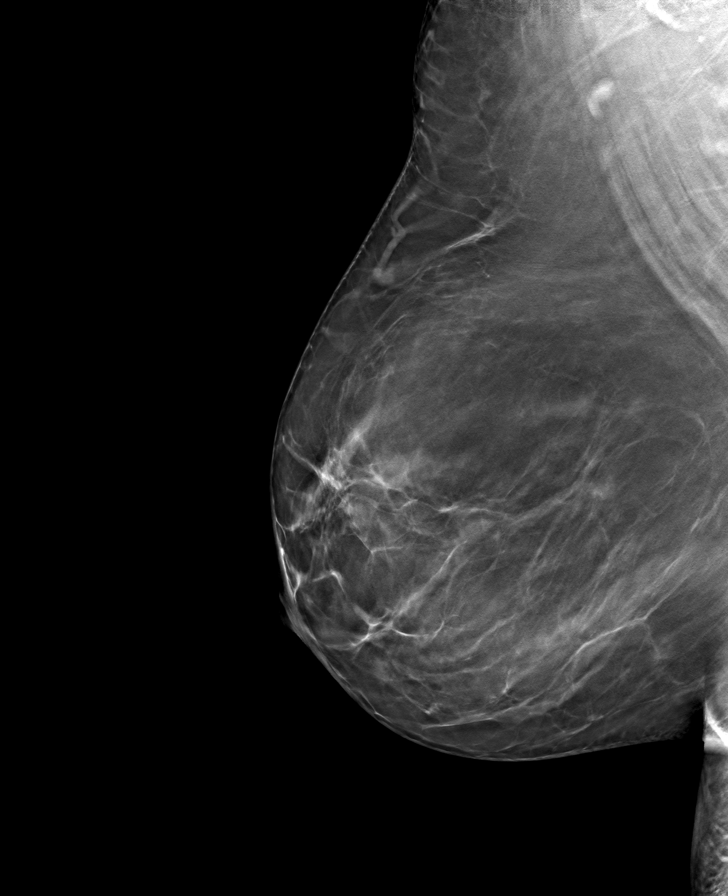

[R CC tomo · tomo slice 45/90.0]
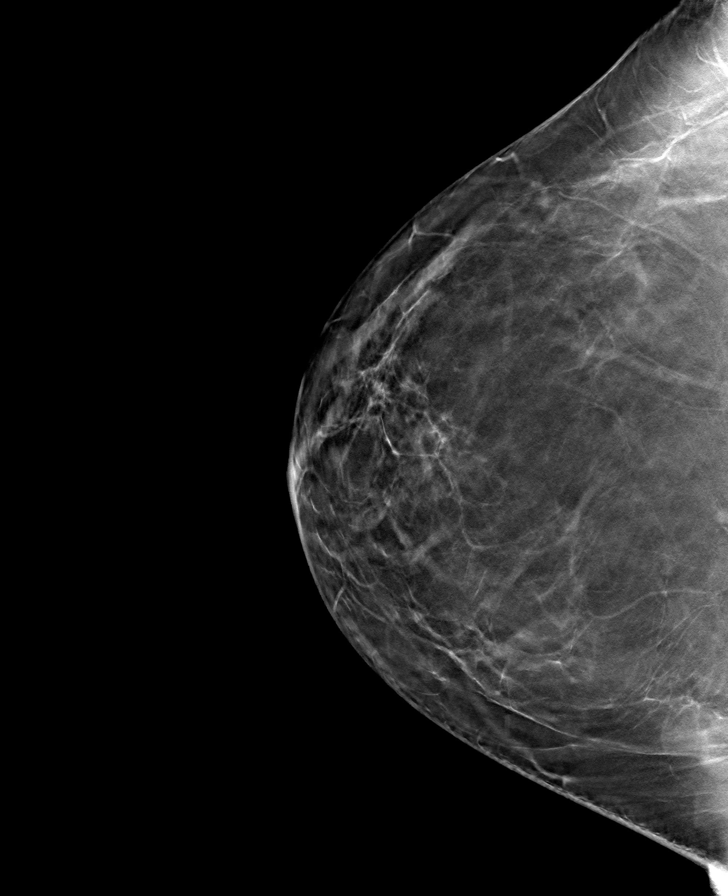

[L CC tomo · tomo slice 42/83.0]
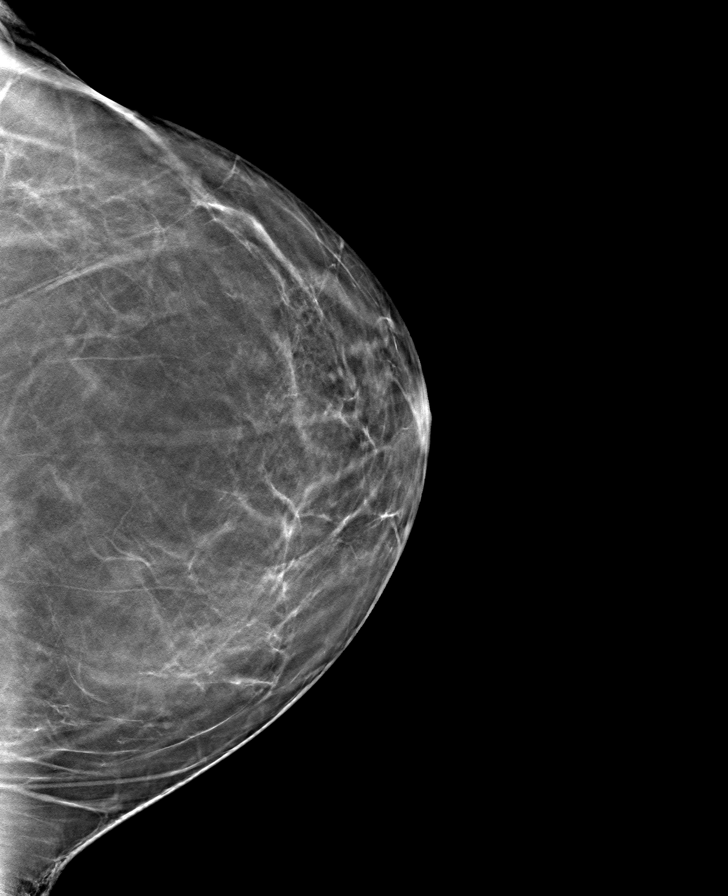

[L MLO tomo · tomo slice 47/94.0]
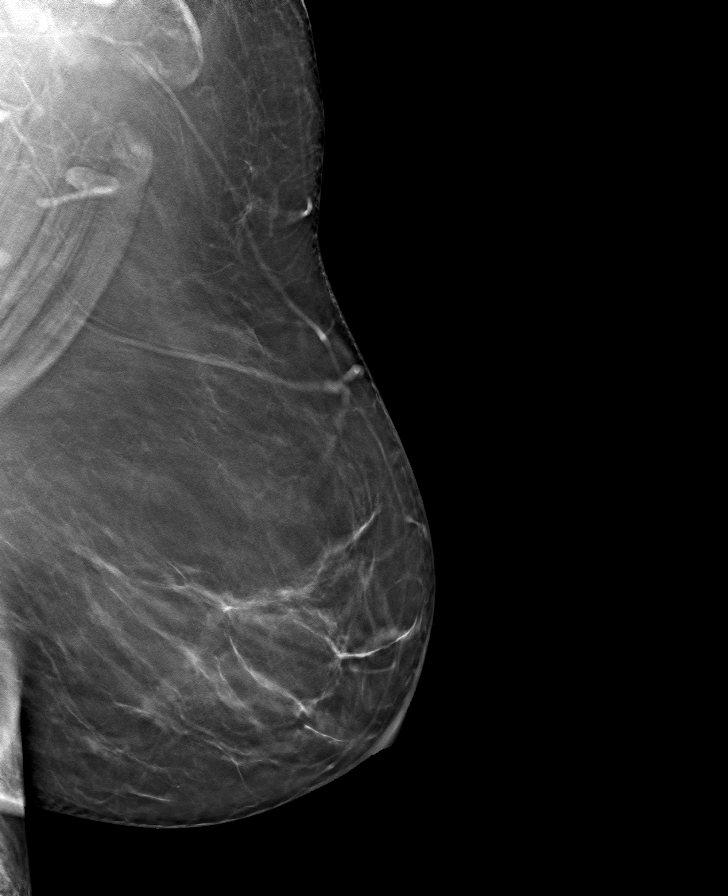

[8 of 24 positions shown; findings below may reference images not displayed]

ACR Breast Density Category b: There are scattered areas of
fibroglandular density.
FINDINGS: There are no findings suspicious for malignancy. Images were
processed with CAD.
IMPRESSION: No mammographic evidence of malignancy. A result letter of this
screening mammogram will be mailed directly to the patient.

RECOMMENDATION:
Screening mammogram in one year. (Code:[TQ])

BI-RADS CATEGORY  1: Negative.

## 2019-07-07 ENCOUNTER — Ambulatory Visit
Admission: EM | Admit: 2019-07-07 | Discharge: 2019-07-07 | Disposition: A | Discharge status: Home / Self Care | Payer: Medicare HMO | Attending: Family Medicine | Admitting: Family Medicine

## 2019-07-07 ENCOUNTER — Other Ambulatory Visit: Payer: Self-pay

## 2019-07-07 ENCOUNTER — Encounter: Payer: Self-pay | Admitting: Emergency Medicine

## 2019-07-07 DIAGNOSIS — U071 COVID-19: Secondary | ICD-10-CM | POA: Insufficient documentation

## 2019-07-07 LAB — RAPID STREP SCREEN (MED CTR MEBANE ONLY): Streptococcus, Group A Screen (Direct): NEGATIVE

## 2019-07-07 LAB — SARS CORONAVIRUS 2 AG (30 MIN TAT): SARS Coronavirus 2 Ag: POSITIVE — AB

## 2019-07-07 MED ORDER — LIDOCAINE VISCOUS HCL 2 % MT SOLN: OROMUCOSAL | 0 refills | Status: DC

## 2019-07-07 NOTE — ED Triage Notes (Signed)
Patient c/o sore throat that started off and on earlier this week.  Patient denies fevers.  Patient's husband tested positive for COVID and pneumonia today at [facility].

## 2019-07-07 NOTE — ED Provider Notes (Signed)
[facility]    CSN: [id number] Arrival date & time: 07/07/19  1113  History   Chief Complaint Chief Complaint  Patient presents with  . Sore Throat   HPI  58 year old female presents with sore throat.  Patient reports that she has had a sore throat for the past few days.  Denies fever.  She states that she has no other respiratory symptoms.  Her husband just tested positive for COVID-19 today.  He has multiple comorbidities.  Patient states that she is unsure of who she is coming to contact with that has caused the spread of COVID-19.  She is currently feeling well other than the sore throat.  She is concerned that she has COVID-19.  Desires testing today.  No known exacerbating or relieving factors.  No other complaints.  PMH, Surgical Hx, Family Hx, Social History reviewed and updated as below.  Past Medical History:  Diagnosis Date  . Arthritis    HNP- lumbar, DJD, spondylosis  . Cancer (HCC)    r side of neck- basal cell ca  . Depression    depression, trouble sleeping   . Fibromyalgia    no medical treatment - treats naturally /w exercise   . Hypertension   . Osteoarthritis     Patient Active Problem List   Diagnosis Date Noted  . Onychomycosis 05/01/2016  . Greater trochanteric bursitis 08/21/2015  . DDD (degenerative disc disease), lumbar 12/29/2014  . DDD (degenerative disc disease), cervical 12/29/2014  . 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 Lumbar facet arthropathy 12/29/2014  . Bilateral occipital neuralgia 12/29/2014    Past Surgical History:  Procedure Laterality Date  . BACK SURGERY     corpectomy, cerv. fusion -2004  . CARPAL TUNNEL RELEASE Right   . FOOT OSTEOTOMY W/ PLANTAR FASCIA RELEASE     R foot, [facility] Evans procedure   . LASER ABLATION     both legs, sclerotherapy   . LUMBAR LAMINECTOMY/DECOMPRESSION MICRODISCECTOMY  09/17/2011   Procedure: LUMBAR LAMINECTOMY/DECOMPRESSION MICRODISCECTOMY;  Surgeon: Floyce Stakes, MD;   Location: [facility];  Service: Neurosurgery;  Laterality: N/A;  Left Lumbar three-four, Right Lumbar four-five Discectomies  . TONSILLECTOMY     as a child   . VAGINAL DELIVERY    . VARICOSE VEIN SURGERY     segmental, bilateral     OB History   No obstetric history on file.      Home Medications    Prior to Admission medications   Medication Sig Start Date End Date Taking? Authorizing Provider  Adalimumab (HUMIRA PEN) 40 MG/0.4ML PNKT Inject 80mg  SQ on Day 1, 40mg  on Day 8, then 40mg  every other week Quantity 4 Maintenance: Inject 40mg  SQ every other week Quantity 2 02/10/18  Yes [provider]  albuterol (PROVENTIL HFA;VENTOLIN HFA) 108 (90 Base) MCG/ACT inhaler Inhale into the lungs. 11/19/16  Yes [provider]  citalopram (CELEXA) 20 MG tablet Take 20 mg by mouth daily.   Yes [provider]  HYDROcodone-acetaminophen (NORCO/VICODIN) 5-325 MG tablet Limit one half to one tablet by mouth per day if tolerated 03/08/16  Yes Mohammed Kindle, MD  topiramate (TOPAMAX) 25 MG capsule Take 50 mg by mouth 2 (two) times daily.   Yes [provider]  Vitamin D, Ergocalciferol, (DRISDOL) 50000 UNITS CAPS Take 50,000 Units by mouth every 7 (seven) days. Friday   Yes [provider]  diclofenac sodium (VOLTAREN) 1 % GEL Apply 2 g topically 4 (four) times daily. Rub into affected  area of foot 2 to 4 times daily 10/20/16   Trula Slade, DPM  lidocaine (XYLOCAINE) 2 % solution Gargle 15 mL every 3 hours as needed. May swallow if desired. For severe sore throat. 07/07/19   Coral Spikes, DO  fluticasone (FLONASE) 50 MCG/ACT nasal spray Place into the nose. 11/19/16 07/07/19  [provider]    Family History Family History  Problem Relation Age of Onset  . Hypertension Mother   . Thyroid disease Mother   . Depression Mother   . Stroke Maternal Grandmother   . Other Maternal Grandmother   . Anesthesia problems Neg Hx   . Hypotension  Neg Hx   . Malignant hyperthermia Neg Hx   . Pseudochol deficiency Neg Hx   . Breast cancer Neg Hx     Social History Social History   Tobacco Use  . Smoking status: Never Smoker  . Smokeless tobacco: Never Used  Substance Use Topics  . Alcohol use: No  . Drug use: No     Allergies   Sulfa antibiotics, Demerol [meperidine hcl], Gluten, and Morphine and related   Review of Systems Review of Systems  Constitutional: Negative.   HENT: Positive for sore throat.    Physical Exam Triage Vital Signs ED Triage Vitals  Enc Vitals Group     BP 07/07/19 1133 138/65     Pulse Rate 07/07/19 1133 (!) 52     Resp 07/07/19 1133 16     Temp 07/07/19 1133 97.7 F (36.5 C)     Temp Source 07/07/19 1133 Oral     SpO2 07/07/19 1133 100 %     Weight 07/07/19 1129 224 lb (101.6 kg)     Height 07/07/19 1129 5\' 5"  (1.651 m)     Head Circumference --      Peak Flow --      Pain Score 07/07/19 1129 1     Pain Loc --      Pain Edu? --      Excl. in [place]? --    Updated Vital Signs BP 138/65 (BP Location: Left Arm)   Pulse (!) 52   Temp 97.7 F (36.5 C) (Oral)   Resp 16   Ht 5\' 5"  (1.651 m)   Wt 101.6 kg   LMP 08/27/2011   SpO2 100%   BMI 37.28 kg/m   Visual Acuity Right Eye Distance:   Left Eye Distance:   Bilateral Distance:    Right Eye Near:   Left Eye Near:    Bilateral Near:     Physical Exam Vitals and nursing note reviewed.  Constitutional:      General: She is not in acute distress.    Appearance: Normal appearance. She is obese. She is not ill-appearing.  HENT:     Head: Normocephalic and atraumatic.     Mouth/Throat:     Pharynx: Oropharynx is clear. No posterior oropharyngeal erythema.  Eyes:     General:        Right eye: No discharge.        Left eye: No discharge.     Conjunctiva/sclera: Conjunctivae normal.  Cardiovascular:     Rate and Rhythm: Normal rate and regular rhythm.     Heart sounds: No murmur.  Pulmonary:     Effort: Pulmonary effort  is normal.     Breath sounds: Normal breath sounds. No wheezing, rhonchi or rales.  Neurological:     Mental Status: She is alert.  Psychiatric:  Mood and Affect: Mood normal.        Behavior: Behavior normal.    UC Treatments / Results  Labs (all labs ordered are listed, but only abnormal results are displayed) Labs Reviewed  SARS CORONAVIRUS 2 AG (30 MIN TAT) - Abnormal; Notable for the following components:      Result Value   SARS Coronavirus 2 Ag POSITIVE (*)    All other components within normal limits  RAPID STREP SCREEN ([facility] ONLY)  CULTURE, GROUP A STREP [facility])    EKG   Radiology No results found.  Procedures Procedures (including critical care time)  Medications Ordered in UC Medications - No data to display  Initial Impression / Assessment and Plan / UC Course  I have reviewed the triage vital signs and the nursing notes.  Pertinent labs & imaging results that were available during my care of the patient were reviewed by me and considered in my medical decision making (see chart for details).    58 year old female presents with sore throat.  Rapid Covid test positive.  Viscous lidocaine as needed.  Advised to stay at home and self quarantine.  Supportive care.  Final Clinical Impressions(s) / UC Diagnoses   Final diagnoses:  [id number]   Discharge Instructions   None    ED Prescriptions    Medication Sig Dispense Auth. Provider   lidocaine (XYLOCAINE) 2 % solution Gargle 15 mL every 3 hours as needed. May swallow if desired. For severe sore throat. 200 mL Coral Spikes, DO     PDMP not reviewed this encounter.   Coral Spikes, DO 07/07/19 1329

## 2019-07-08 ENCOUNTER — Telehealth: Payer: Self-pay | Admitting: Unknown Physician Specialty

## 2019-07-08 ENCOUNTER — Other Ambulatory Visit: Payer: Self-pay | Admitting: Unknown Physician Specialty

## 2019-07-08 DIAGNOSIS — U071 COVID-19: Secondary | ICD-10-CM

## 2019-07-08 NOTE — Telephone Encounter (Signed)
  I connected by phone with Amado Nash on 07/08/2019 at 10:28 AM to discuss the potential use of an new treatment for mild to moderate COVID-19 viral infection in non-hospitalized patients.  This patient is a 58 y.o. female that meets the FDA criteria for Emergency Use Authorization of bamlanivimab or casirivimab\imdevimab.  Has a (+) direct SARS-CoV-2 viral test result  Has mild or moderate COVID-19   Is ? 59 years of age and weighs ? 40 kg  Is NOT hospitalized due to COVID-19  Is NOT requiring oxygen therapy or requiring an increase in baseline oxygen flow rate due to COVID-19  Is within 10 days of symptom onset  Has at least one of the high risk factor(s) for progression to severe COVID-19 and/or hospitalization as defined in EUA.  Specific high risk criteria : BMI >/= 35    I have spoken and communicated the following to the patient or parent/caregiver:  1. FDA has authorized the emergency use of bamlanivimab and casirivimab\imdevimab for the treatment of mild to moderate COVID-19 in adults and pediatric patients with positive results of direct SARS-CoV-2 viral testing who are 37 years of age and older weighing at least 40 kg, and who are at high risk for progressing to severe COVID-19 and/or hospitalization.  2. The significant known and potential risks and benefits of bamlanivimab and casirivimab\imdevimab, and the extent to which such potential risks and benefits are unknown.  3. Information on available alternative treatments and the risks and benefits of those alternatives, including clinical trials.  4. Patients treated with bamlanivimab and casirivimab\imdevimab should continue to self-isolate and use infection control measures (e.g., wear mask, isolate, social distance, avoid sharing personal items, clean and disinfect "high touch" surfaces, and frequent handwashing) according to CDC guidelines.   5. The patient or parent/caregiver has the option to accept or refuse  bamlanivimab or casirivimab\imdevimab .  After reviewing this information with the patient, The patient agreed to proceed with receiving the casirivimab\imdevimab infusion and will be provided a copy of the Fact sheet prior to receiving the infusion.Kathrine Haddock 07/08/2019 10:28 AM

## 2019-07-09 LAB — CULTURE, GROUP A STREP (THRC)

## 2019-07-10 ENCOUNTER — Ambulatory Visit (HOSPITAL_COMMUNITY)
Admission: RE | Admit: 2019-07-10 | Discharge: 2019-07-10 | Disposition: A | Discharge status: Home / Self Care | Payer: Medicare Other | Source: Ambulatory Visit | Attending: Pulmonary Disease | Admitting: Pulmonary Disease

## 2019-07-10 DIAGNOSIS — U071 COVID-19: Secondary | ICD-10-CM | POA: Diagnosis not present

## 2019-07-10 DIAGNOSIS — Z23 Encounter for immunization: Secondary | ICD-10-CM | POA: Insufficient documentation

## 2019-07-10 LAB — CULTURE, GROUP A STREP (THRC)

## 2019-07-10 MED ORDER — DIPHENHYDRAMINE HCL 50 MG/ML IJ SOLN: 50.0000 mg | Freq: Once | INTRAMUSCULAR | Status: DC | PRN

## 2019-07-10 MED ORDER — FAMOTIDINE IN NACL 20-0.9 MG/50ML-% IV SOLN: 20.0000 mg | Freq: Once | INTRAVENOUS | Status: DC | PRN

## 2019-07-10 MED ORDER — EPINEPHRINE 0.3 MG/0.3ML IJ SOAJ: 0.3000 mg | Freq: Once | INTRAMUSCULAR | Status: DC | PRN

## 2019-07-10 MED ORDER — METHYLPREDNISOLONE SODIUM SUCC 125 MG IJ SOLR: 125.0000 mg | Freq: Once | INTRAMUSCULAR | Status: DC | PRN

## 2019-07-10 MED ORDER — ALBUTEROL SULFATE HFA 108 (90 BASE) MCG/ACT IN AERS: 2.0000 | INHALATION_SPRAY | Freq: Once | RESPIRATORY_TRACT | Status: DC | PRN

## 2019-07-10 MED ORDER — SODIUM CHLORIDE 0.9 % IV SOLN
INTRAVENOUS | Status: DC | PRN
  Administered 2019-07-10: 250 mL via INTRAVENOUS

## 2019-07-10 MED ORDER — SODIUM CHLORIDE 0.9 % IV SOLN
700.0000 mg | Freq: Once | INTRAVENOUS | Status: AC
  Administered 2019-07-10: 700 mg via INTRAVENOUS
  Filled 2019-07-10: qty 20

## 2019-07-10 NOTE — Progress Notes (Signed)
  Diagnosis: COVID-19  Physician: Dr. Patrick Wright  Procedure: [facility] In[facility] Med: bamlanivimab infusion - Provided patient with bamlanimivab fact sheet for patients, parents and caregivers prior to infusion.  Complications: No immediate complications noted.  Discharge: Discharged home   Ally Yow 07/10/2019   

## 2019-08-14 ENCOUNTER — Other Ambulatory Visit: Payer: Self-pay | Admitting: Family Medicine

## 2019-08-14 DIAGNOSIS — Z1231 Encounter for screening mammogram for malignant neoplasm of breast: Secondary | ICD-10-CM

## 2019-09-20 ENCOUNTER — Ambulatory Visit: Payer: Medicare Other

## 2019-09-20 ENCOUNTER — Ambulatory Visit
Admission: RE | Admit: 2019-09-20 | Discharge: 2019-09-20 | Disposition: A | Discharge status: Home / Self Care | Payer: Medicare HMO | Source: Ambulatory Visit | Attending: Family Medicine | Admitting: Family Medicine

## 2019-09-20 ENCOUNTER — Other Ambulatory Visit: Payer: Self-pay

## 2019-09-20 DIAGNOSIS — Z1231 Encounter for screening mammogram for malignant neoplasm of breast: Secondary | ICD-10-CM

## 2019-09-20 IMAGING — MG DIGITAL SCREENING BILAT W/ TOMO W/ CAD
8 series · 8 of 24 positions shown · non-contrast
Comparison: Previous exam(s).

CLINICAL DATA: Screening.

EXAM:
DIGITAL SCREENING BILATERAL MAMMOGRAM WITH TOMO AND CAD

[R CC synth-2D]
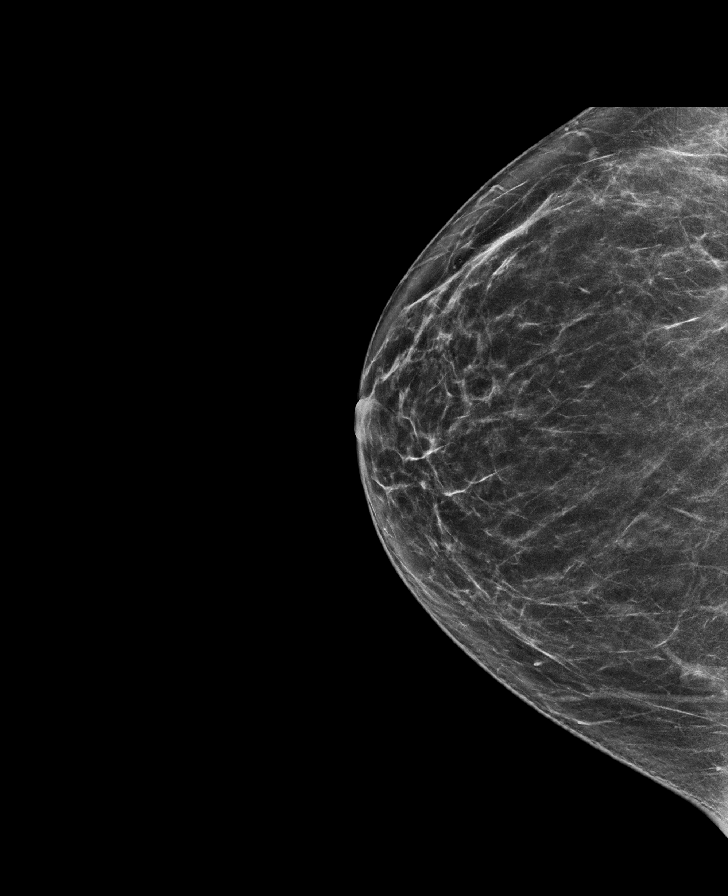

[R MLO synth-2D]
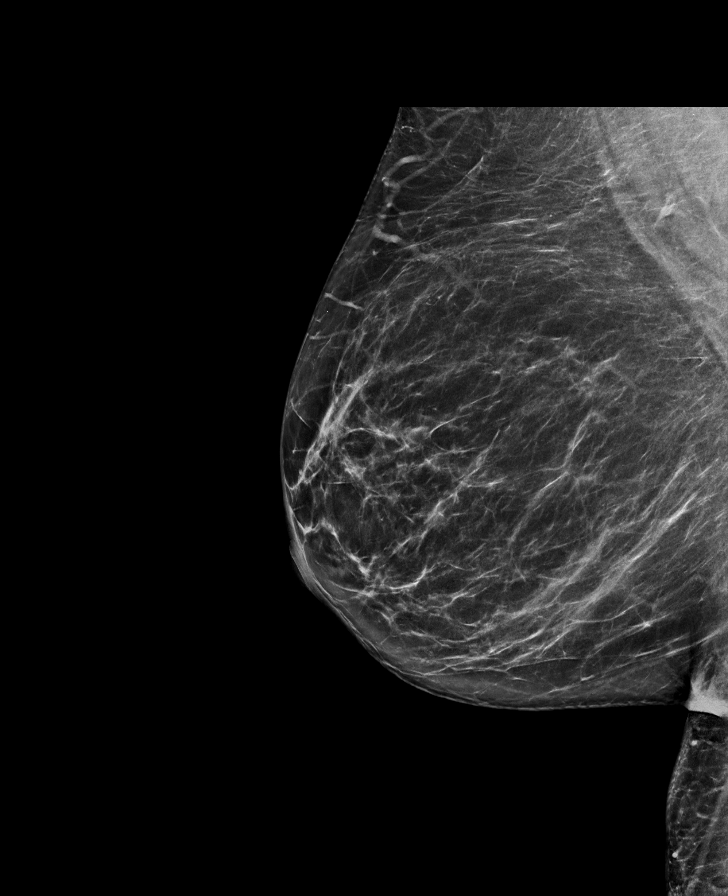

[L MLO synth-2D]
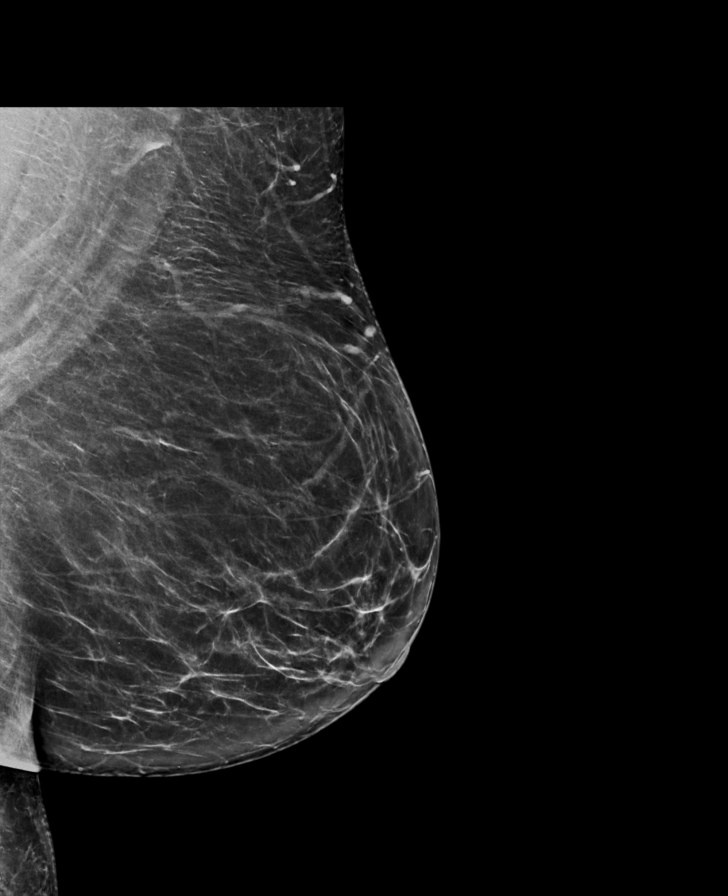

[L CC synth-2D]
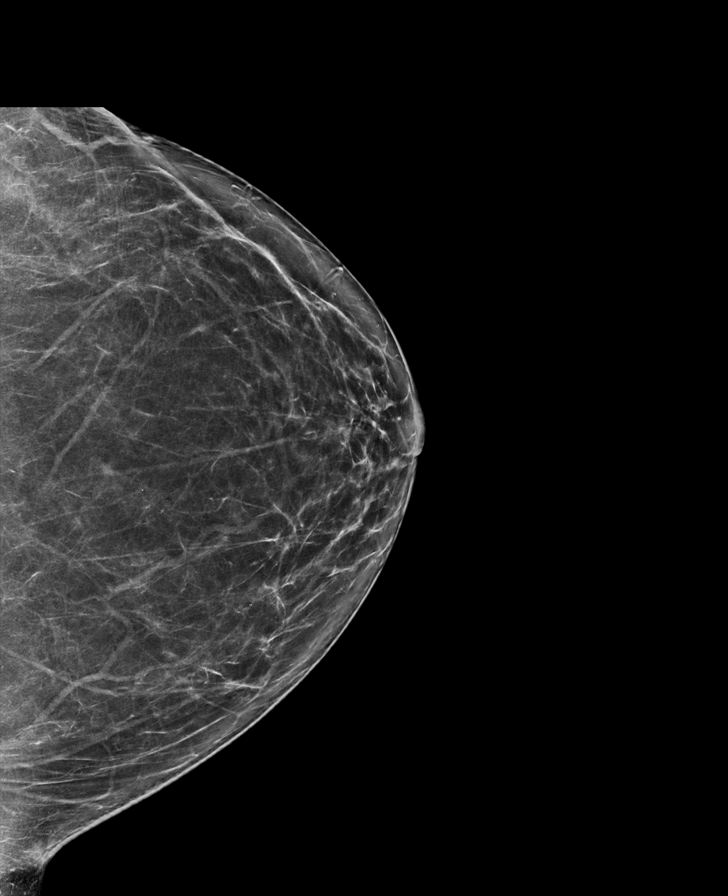

[L CC tomo · tomo slice 38/75.0]
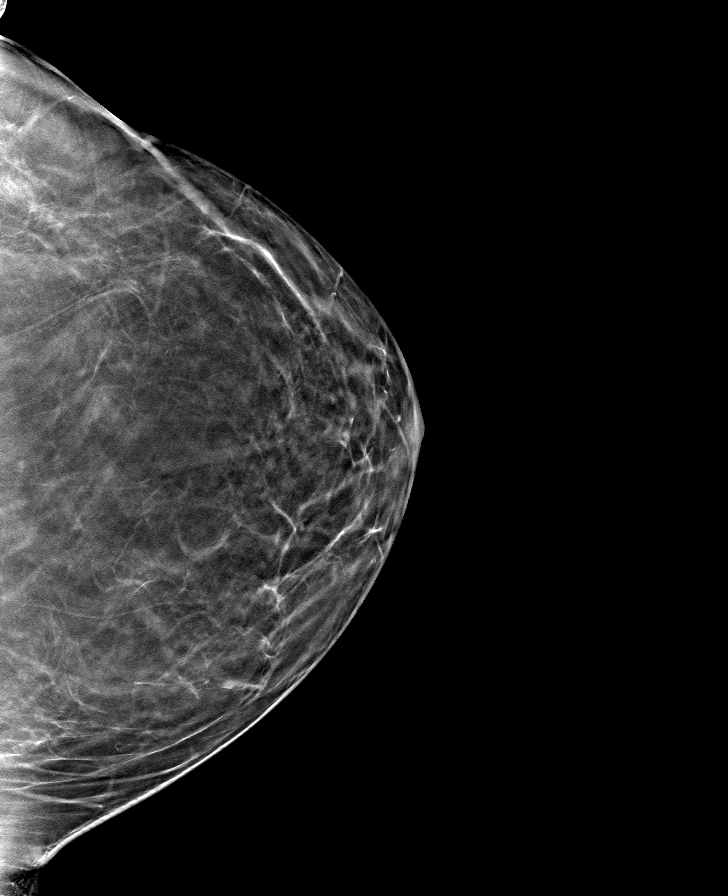

[R MLO tomo · tomo slice 39/77.0]
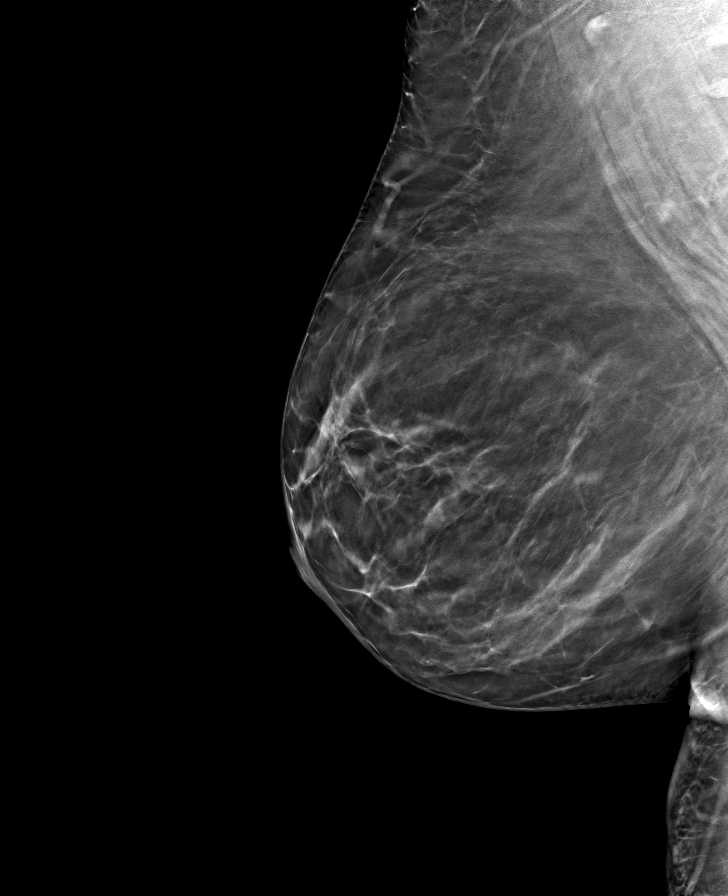

[L MLO tomo · tomo slice 41/81.0]
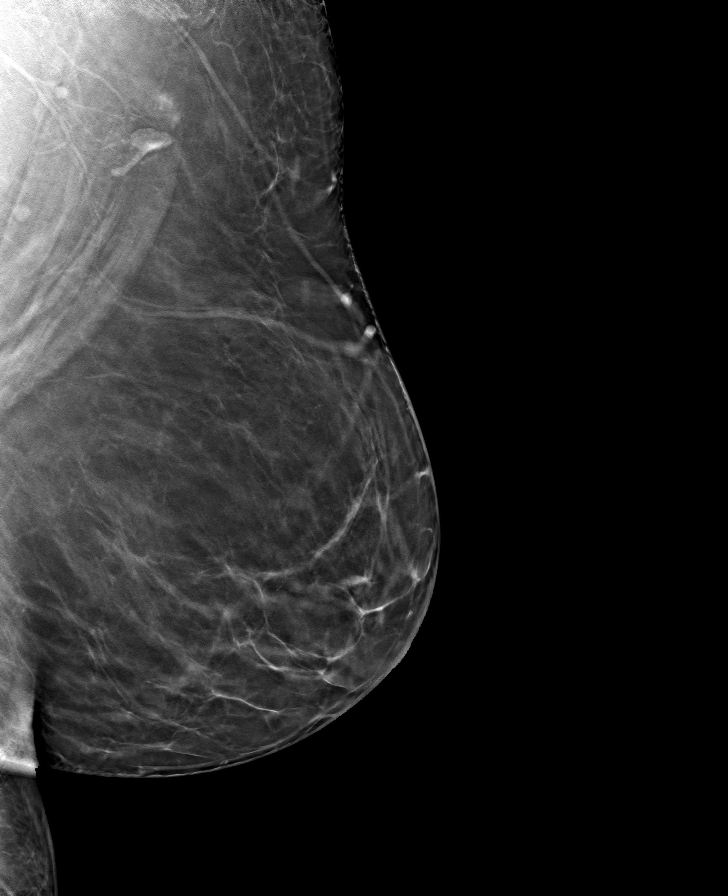

[R CC tomo · tomo slice 39/76.0]
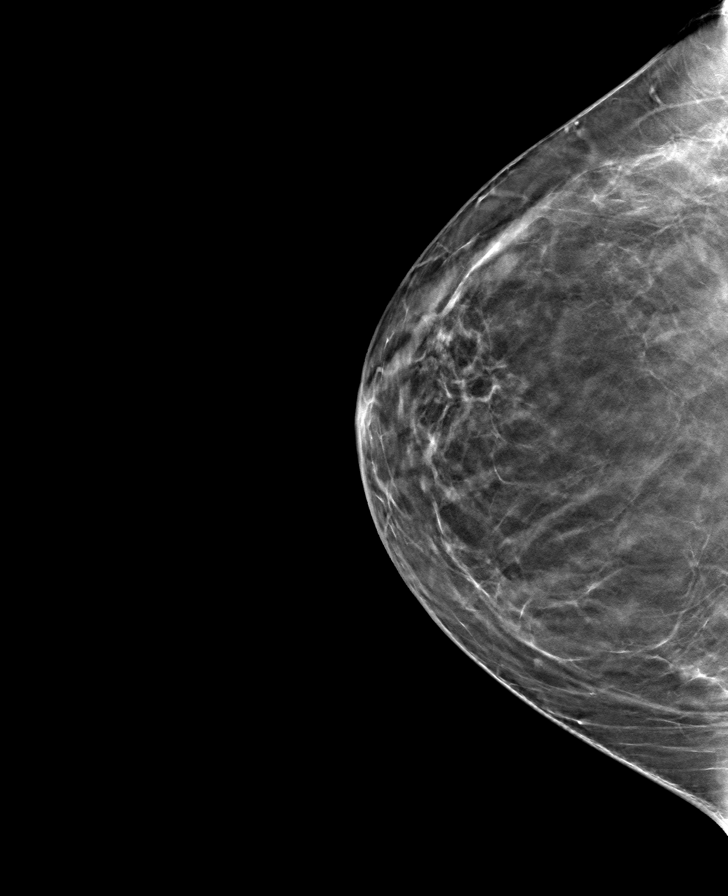

[8 of 24 positions shown; findings below may reference images not displayed]

ACR Breast Density Category b: There are scattered areas of
fibroglandular density.
FINDINGS: There are no findings suspicious for malignancy. Images were
processed with CAD.
IMPRESSION: No mammographic evidence of malignancy. A result letter of this
screening mammogram will be mailed directly to the patient.

RECOMMENDATION:
Screening mammogram in one year. (Code:[TQ])

BI-RADS CATEGORY  1: Negative.

## 2019-11-07 DIAGNOSIS — M1711 Unilateral primary osteoarthritis, right knee: Secondary | ICD-10-CM | POA: Insufficient documentation

## 2019-11-15 ENCOUNTER — Encounter: Payer: Self-pay | Admitting: Ophthalmology

## 2019-11-22 ENCOUNTER — Other Ambulatory Visit
Admission: RE | Admit: 2019-11-22 | Discharge: 2019-11-22 | Disposition: A | Discharge status: Home / Self Care | Payer: Medicare HMO | Source: Ambulatory Visit | Attending: Ophthalmology | Admitting: Ophthalmology

## 2019-11-22 DIAGNOSIS — Z01812 Encounter for preprocedural laboratory examination: Secondary | ICD-10-CM | POA: Insufficient documentation

## 2019-11-22 DIAGNOSIS — Z20822 Contact with and (suspected) exposure to covid-19: Secondary | ICD-10-CM | POA: Insufficient documentation

## 2019-11-22 LAB — SARS CORONAVIRUS 2 (TAT 6-24 HRS): SARS Coronavirus 2: NEGATIVE

## 2019-11-22 NOTE — Discharge Instructions (Signed)

## 2019-11-26 ENCOUNTER — Ambulatory Visit: Payer: Medicare HMO | Admitting: Anesthesiology

## 2019-11-26 ENCOUNTER — Ambulatory Visit
Admission: RE | Admit: 2019-11-26 | Discharge: 2019-11-26 | Disposition: A | Discharge status: Home / Self Care | Payer: Medicare HMO | Attending: Ophthalmology | Admitting: Ophthalmology

## 2019-11-26 ENCOUNTER — Encounter
Admission: RE | Disposition: A | Discharge status: Home / Self Care | Payer: Self-pay | Source: Home / Self Care | Attending: Ophthalmology

## 2019-11-26 ENCOUNTER — Other Ambulatory Visit: Payer: Self-pay

## 2019-11-26 DIAGNOSIS — F329 Major depressive disorder, single episode, unspecified: Secondary | ICD-10-CM | POA: Diagnosis not present

## 2019-11-26 DIAGNOSIS — Z882 Allergy status to sulfonamides status: Secondary | ICD-10-CM | POA: Diagnosis not present

## 2019-11-26 DIAGNOSIS — Z885 Allergy status to narcotic agent status: Secondary | ICD-10-CM | POA: Diagnosis not present

## 2019-11-26 DIAGNOSIS — F419 Anxiety disorder, unspecified: Secondary | ICD-10-CM | POA: Diagnosis not present

## 2019-11-26 DIAGNOSIS — Z79899 Other long term (current) drug therapy: Secondary | ICD-10-CM | POA: Insufficient documentation

## 2019-11-26 DIAGNOSIS — M797 Fibromyalgia: Secondary | ICD-10-CM | POA: Diagnosis not present

## 2019-11-26 DIAGNOSIS — I10 Essential (primary) hypertension: Secondary | ICD-10-CM | POA: Insufficient documentation

## 2019-11-26 DIAGNOSIS — H2512 Age-related nuclear cataract, left eye: Secondary | ICD-10-CM | POA: Diagnosis not present

## 2019-11-26 DIAGNOSIS — Z8616 Personal history of COVID-19: Secondary | ICD-10-CM | POA: Insufficient documentation

## 2019-11-26 DIAGNOSIS — M1990 Unspecified osteoarthritis, unspecified site: Secondary | ICD-10-CM | POA: Diagnosis not present

## 2019-11-26 DIAGNOSIS — H3092 Unspecified chorioretinal inflammation, left eye: Secondary | ICD-10-CM | POA: Insufficient documentation

## 2019-11-26 HISTORY — PX: CATARACT EXTRACTION W/PHACO: SHX586

## 2019-11-26 SURGERY — PHACOEMULSIFICATION, CATARACT, WITH IOL INSERTION
Anesthesia: General | Site: Eye | Laterality: Left

## 2019-11-26 MED ORDER — SODIUM HYALURONATE 23 MG/ML IO SOLN
INTRAOCULAR | Status: DC | PRN
  Administered 2019-11-26: 0.6 mL via INTRAOCULAR

## 2019-11-26 MED ORDER — SODIUM HYALURONATE 10 MG/ML IO SOLN
INTRAOCULAR | Status: DC | PRN
  Administered 2019-11-26: 0.55 mL via INTRAOCULAR

## 2019-11-26 MED ORDER — EPINEPHRINE PF 1 MG/ML IJ SOLN
INTRAOCULAR | Status: DC | PRN
  Administered 2019-11-26: 67 mL via OPHTHALMIC

## 2019-11-26 MED ORDER — TRIAMCINOLONE ACETONIDE 40 MG/ML IJ SUSP
INTRAMUSCULAR | Status: DC | PRN
  Administered 2019-11-26: .1 mL

## 2019-11-26 MED ORDER — TETRACAINE HCL 0.5 % OP SOLN
1.0000 [drp] | OPHTHALMIC | Status: DC | PRN
  Administered 2019-11-26 (×3): 1 [drp] via OPHTHALMIC

## 2019-11-26 MED ORDER — MIDAZOLAM HCL 2 MG/2ML IJ SOLN
INTRAMUSCULAR | Status: DC | PRN
  Administered 2019-11-26: 2 mg via INTRAVENOUS

## 2019-11-26 MED ORDER — FENTANYL CITRATE (PF) 100 MCG/2ML IJ SOLN
INTRAMUSCULAR | Status: DC | PRN
  Administered 2019-11-26: 100 ug via INTRAVENOUS

## 2019-11-26 MED ORDER — MOXIFLOXACIN HCL 0.5 % OP SOLN
OPHTHALMIC | Status: DC | PRN
  Administered 2019-11-26: 0.2 mL via OPHTHALMIC

## 2019-11-26 MED ORDER — LIDOCAINE HCL (PF) 2 % IJ SOLN
INTRAOCULAR | Status: DC | PRN
  Administered 2019-11-26: 11:00:00 1 mL via INTRAOCULAR

## 2019-11-26 MED ORDER — ARMC OPHTHALMIC DILATING DROPS
1.0000 "application " | OPHTHALMIC | Status: DC | PRN
  Administered 2019-11-26 (×3): 1 via OPHTHALMIC

## 2019-11-26 MED ORDER — ONDANSETRON HCL 4 MG/2ML IJ SOLN
4.0000 mg | Freq: Once | INTRAMUSCULAR | Status: AC
  Administered 2019-11-26: 10:00:00 4 mg via INTRAVENOUS

## 2019-11-26 SURGICAL SUPPLY — 20 items
CANNULA ANT/CHMB 27G (MISCELLANEOUS) ×2 IMPLANT
CANNULA ANT/CHMB 27GA (MISCELLANEOUS) ×6 IMPLANT
DISSECTOR HYDRO NUCLEUS 50X22 (MISCELLANEOUS) ×3 IMPLANT
GLOVE SURG LX 7.5 STRW (GLOVE) ×4
GLOVE SURG LX STRL 7.5 STRW (GLOVE) ×1 IMPLANT
GLOVE SURG SYN 8.5  E (GLOVE) ×3
GLOVE SURG SYN 8.5 E (GLOVE) ×1 IMPLANT
GLOVE SURG SYN 8.5 PF PI (GLOVE) ×1 IMPLANT
GOWN STRL REUS W/ TWL LRG LVL3 (GOWN DISPOSABLE) ×2 IMPLANT
GOWN STRL REUS W/TWL LRG LVL3 (GOWN DISPOSABLE) ×6
LENS IOL TECNIS 20.0 (Intraocular Lens) ×3 IMPLANT
LENS IOL TECNIS MONO 1P 20.0 (Intraocular Lens) IMPLANT
MARKER SKIN DUAL TIP RULER LAB (MISCELLANEOUS) ×3 IMPLANT
PACK DR. KING ARMS (PACKS) ×3 IMPLANT
PACK EYE AFTER SURG (MISCELLANEOUS) ×3 IMPLANT
PACK OPTHALMIC (MISCELLANEOUS) ×3 IMPLANT
SYR 3ML LL SCALE MARK (SYRINGE) ×3 IMPLANT
SYR TB 1ML LUER SLIP (SYRINGE) ×3 IMPLANT
WATER STERILE IRR 250ML POUR (IV SOLUTION) ×3 IMPLANT
WIPE NON LINTING 3.25X3.25 (MISCELLANEOUS) ×3 IMPLANT

## 2019-11-26 NOTE — H&P (Signed)

## 2019-11-26 NOTE — Anesthesia Postprocedure Evaluation (Addendum)
Anesthesia Post Note  Patient: Jody Turner  Procedure(s) Performed: CATARACT EXTRACTION PHACO AND INTRAOCULAR LENS PLACEMENT (IOC) LEFT INJECTION OF INTRAVITREAL KENALOG (Left Eye)     Patient location during evaluation: PACU Anesthesia Type: General Level of consciousness: awake and alert Pain management: pain level controlled Vital Signs Assessment: post-procedure vital signs reviewed and stable Respiratory status: spontaneous breathing, nonlabored ventilation, respiratory function stable and patient connected to nasal cannula oxygen Cardiovascular status: blood pressure returned to baseline and stable Postop Assessment: no apparent nausea or vomiting Anesthetic complications: no    Donnabelle Blanchard ELAINE

## 2019-11-26 NOTE — Anesthesia Preprocedure Evaluation (Addendum)
Anesthesia Evaluation  Patient identified by MRN, date of birth, ID band Patient awake    Reviewed: Allergy & Precautions, H&P , NPO status , Patient's Chart, lab work & pertinent test results, reviewed documented beta blocker date and time   Airway Mallampati: II  TM Distance: >3 FB Neck ROM: full    Dental no notable dental hx.    Pulmonary  COVID 06/2019   Pulmonary exam normal breath sounds clear to auscultation       Cardiovascular Exercise Tolerance: Good hypertension, negative cardio ROS   Rhythm:regular Rate:Normal     Neuro/Psych PSYCHIATRIC DISORDERS Depression  Neuromuscular disease (Fibromyalgia  no medical treatment - treats naturally /w exercise  )    GI/Hepatic negative GI ROS, Neg liver ROS,   Endo/Other  negative endocrine ROS  Renal/GU negative Renal ROS  negative genitourinary   Musculoskeletal  (+) Arthritis , Fibromyalgia -  Abdominal   Peds  Hematology negative hematology ROS (+)   Anesthesia Other Findings   Reproductive/Obstetrics negative OB ROS                            Anesthesia Physical Anesthesia Plan  ASA: II  Anesthesia Plan: General   Post-op Pain Management:    Induction:   PONV Risk Score and Plan: 3 and TIVA and Propofol infusion  Airway Management Planned:   Additional Equipment:   Intra-op Plan:   Post-operative Plan:   Informed Consent: I have reviewed the patients History and Physical, chart, labs and discussed the procedure including the risks, benefits and alternatives for the proposed anesthesia with the patient or authorized representative who has indicated his/her understanding and acceptance.     Dental Advisory Given  Plan Discussed with: CRNA  Anesthesia Plan Comments:         Anesthesia Quick Evaluation

## 2019-11-26 NOTE — Transfer of Care (Signed)
Immediate Anesthesia Transfer of Care Note  Patient: Jody Turner  Procedure(s) Performed: CATARACT EXTRACTION PHACO AND INTRAOCULAR LENS PLACEMENT (IOC) LEFT INJECTION OF INTRAVITREAL KENALOG (Left Eye)  Patient Location: PACU  Anesthesia Type: General  Level of Consciousness: awake, alert  and patient cooperative  Airway and Oxygen Therapy: Patient Spontanous Breathing and Patient connected to supplemental oxygen  Post-op Assessment: Post-op Vital signs reviewed, Patient's Cardiovascular Status Stable, Respiratory Function Stable, Patent Airway and No signs of Nausea or vomiting  Post-op Vital Signs: Reviewed and stable  Complications: No apparent anesthesia complications

## 2019-11-26 NOTE — Op Note (Signed)
OPERATIVE NOTE  NORITA DAVAULT [id number] 11/26/2019   PREOPERATIVE DIAGNOSIS:   2.  Nuclear sclerotic cataract left eye.  H25.12 2.  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   POSTOPERATIVE DIAGNOSIS:    same   PROCEDURE:   1.  CPT [id number] Phacoemusification with posterior chamber intraocular lens placement of the left eye  2.  CPT [id number] Intravitreal injection of kenalog, left eye.  LENS:   Implant Name Type Inv. Item Serial No. Manufacturer Lot No. LRB No. Used Action  LENS IOL TECNIS 20.0 - [id number] Intraocular Lens LENS IOL TECNIS 20.0 [id number] AMO  Left 1 Implanted      Procedure(s) with comments: CATARACT EXTRACTION PHACO AND INTRAOCULAR LENS PLACEMENT (IOC) LEFT INJECTION OF INTRAVITREAL KENALOG (Left) - 1.18 0:21.7  DCB00 +20.0   ULTRASOUND TIME: 0 minutes 21 seconds.  CDE 1.18   SURGEON:  Benay Pillow, MD, MPH   ANESTHESIA:  Topical with tetracaine drops augmented with 1% preservative-free intracameral lidocaine.  ESTIMATED BLOOD LOSS: <1 mL   COMPLICATIONS:  None.   DESCRIPTION OF PROCEDURE:  The patient was identified in the holding room and transported to the operating room and placed in the supine position under the operating microscope.  The left eye was identified as the operative eye and it was prepped and draped in the usual sterile ophthalmic fashion.   A 1.0 millimeter clear-corneal paracentesis was made at the 5:00 position. 0.5 ml of preservative-free 1% lidocaine with epinephrine was injected into the anterior chamber.  The anterior chamber was filled with Healon 5 viscoelastic.  A 2.4 millimeter keratome was used to make a near-clear corneal incision at the 2:00 position.  A curvilinear capsulorrhexis was made with a cystotome and capsulorrhexis forceps.  Balanced salt solution was used to hydrodissect and hydrodelineate the nucleus.   Phacoemulsification was then used in stop and chop fashion to remove the lens nucleus and epinucleus.  The remaining  cortex was then removed using the irrigation and aspiration handpiece. Healon was then placed into the capsular bag to distend it for lens placement.  A lens was then injected into the capsular bag.  The remaining viscoelastic was aspirated.   Wounds were hydrated with balanced salt solution.  The anterior chamber was inflated to a physiologic pressure with balanced salt solution.  Intracameral vigamox 0.1 mL undiltued was injected into the eye and a drop placed onto the ocular surface.  Calipers were used to mark 3.5 mm posterior to the limbus in the inferotemporal quadrant.  0.6 mL of Kenalog 40 mg/mL was injected into the vitreous cavity. (a slightly lower dose was used given the history of steroid response).    No wound leaks were noted.  The patient was taken to the recovery room in stable condition without complications of anesthesia or surgery  Benay Pillow 11/26/2019, 11:25 AM

## 2019-11-27 ENCOUNTER — Encounter: Payer: Self-pay | Admitting: *Deleted

## 2019-12-25 ENCOUNTER — Encounter: Payer: Self-pay | Admitting: Ophthalmology

## 2019-12-25 ENCOUNTER — Other Ambulatory Visit: Payer: Self-pay

## 2019-12-27 ENCOUNTER — Other Ambulatory Visit: Payer: Self-pay

## 2019-12-27 ENCOUNTER — Other Ambulatory Visit
Admission: RE | Admit: 2019-12-27 | Discharge: 2019-12-27 | Disposition: A | Discharge status: Home / Self Care | Payer: Medicare HMO | Source: Ambulatory Visit | Attending: Ophthalmology | Admitting: Ophthalmology

## 2019-12-27 DIAGNOSIS — Z01812 Encounter for preprocedural laboratory examination: Secondary | ICD-10-CM | POA: Insufficient documentation

## 2019-12-27 DIAGNOSIS — Z20822 Contact with and (suspected) exposure to covid-19: Secondary | ICD-10-CM | POA: Insufficient documentation

## 2019-12-27 LAB — SARS CORONAVIRUS 2 (TAT 6-24 HRS): SARS Coronavirus 2: NEGATIVE

## 2019-12-27 NOTE — Discharge Instructions (Signed)

## 2019-12-31 ENCOUNTER — Ambulatory Visit: Payer: Medicare HMO | Admitting: Anesthesiology

## 2019-12-31 ENCOUNTER — Encounter
Admission: RE | Disposition: A | Discharge status: Home / Self Care | Payer: Self-pay | Source: Home / Self Care | Attending: Ophthalmology

## 2019-12-31 ENCOUNTER — Other Ambulatory Visit: Payer: Self-pay

## 2019-12-31 ENCOUNTER — Encounter: Payer: Self-pay | Admitting: Ophthalmology

## 2019-12-31 ENCOUNTER — Ambulatory Visit
Admission: RE | Admit: 2019-12-31 | Discharge: 2019-12-31 | Disposition: A | Discharge status: Home / Self Care | Payer: Medicare HMO | Attending: Ophthalmology | Admitting: Ophthalmology

## 2019-12-31 DIAGNOSIS — H2511 Age-related nuclear cataract, right eye: Secondary | ICD-10-CM | POA: Insufficient documentation

## 2019-12-31 DIAGNOSIS — M1990 Unspecified osteoarthritis, unspecified site: Secondary | ICD-10-CM | POA: Insufficient documentation

## 2019-12-31 DIAGNOSIS — I10 Essential (primary) hypertension: Secondary | ICD-10-CM | POA: Insufficient documentation

## 2019-12-31 DIAGNOSIS — Z8616 Personal history of COVID-19: Secondary | ICD-10-CM | POA: Insufficient documentation

## 2019-12-31 DIAGNOSIS — H3091 Unspecified chorioretinal inflammation, right eye: Secondary | ICD-10-CM | POA: Diagnosis not present

## 2019-12-31 DIAGNOSIS — R519 Headache, unspecified: Secondary | ICD-10-CM | POA: Insufficient documentation

## 2019-12-31 DIAGNOSIS — Z79899 Other long term (current) drug therapy: Secondary | ICD-10-CM | POA: Insufficient documentation

## 2019-12-31 HISTORY — PX: CATARACT EXTRACTION W/PHACO: SHX586

## 2019-12-31 SURGERY — PHACOEMULSIFICATION, CATARACT, WITH IOL INSERTION
Anesthesia: Monitor Anesthesia Care | Site: Eye | Laterality: Right

## 2019-12-31 MED ORDER — TRIAMCINOLONE ACETONIDE 40 MG/ML IJ SUSP
INTRAMUSCULAR | Status: DC | PRN
  Administered 2019-12-31: .1 mL via INTRAMUSCULAR

## 2019-12-31 MED ORDER — EPINEPHRINE PF 1 MG/ML IJ SOLN
INTRAOCULAR | Status: DC | PRN
  Administered 2019-12-31: 74 mL via OPHTHALMIC

## 2019-12-31 MED ORDER — ACETAMINOPHEN 160 MG/5ML PO SOLN: 325.0000 mg | Freq: Once | ORAL | Status: DC

## 2019-12-31 MED ORDER — MOXIFLOXACIN HCL 0.5 % OP SOLN
OPHTHALMIC | Status: DC | PRN
  Administered 2019-12-31: 0.2 mL via OPHTHALMIC

## 2019-12-31 MED ORDER — SODIUM HYALURONATE 10 MG/ML IO SOLN
INTRAOCULAR | Status: DC | PRN
  Administered 2019-12-31: 0.55 mL via INTRAOCULAR

## 2019-12-31 MED ORDER — SODIUM HYALURONATE 23 MG/ML IO SOLN
INTRAOCULAR | Status: DC | PRN
  Administered 2019-12-31: 0.6 mL via INTRAOCULAR

## 2019-12-31 MED ORDER — TETRACAINE HCL 0.5 % OP SOLN
1.0000 [drp] | OPHTHALMIC | Status: DC | PRN
  Administered 2019-12-31 (×3): 1 [drp] via OPHTHALMIC

## 2019-12-31 MED ORDER — LIDOCAINE HCL (PF) 2 % IJ SOLN
INTRAOCULAR | Status: DC | PRN
  Administered 2019-12-31: 1 mL via INTRAOCULAR

## 2019-12-31 MED ORDER — MIDAZOLAM HCL 2 MG/2ML IJ SOLN
INTRAMUSCULAR | Status: DC | PRN
  Administered 2019-12-31 (×2): 1 mg via INTRAVENOUS

## 2019-12-31 MED ORDER — ACETAMINOPHEN 325 MG PO TABS: 325.0000 mg | ORAL_TABLET | Freq: Once | ORAL | Status: DC

## 2019-12-31 MED ORDER — LACTATED RINGERS IV SOLN: 10.0000 mL/h | INTRAVENOUS | Status: DC

## 2019-12-31 MED ORDER — ONDANSETRON HCL 4 MG/2ML IJ SOLN
4.0000 mg | Freq: Once | INTRAMUSCULAR | Status: AC
  Administered 2019-12-31: 4 mg via INTRAVENOUS

## 2019-12-31 MED ORDER — ARMC OPHTHALMIC DILATING DROPS
1.0000 "application " | OPHTHALMIC | Status: DC | PRN
  Administered 2019-12-31 (×3): 1 via OPHTHALMIC

## 2019-12-31 MED ORDER — FENTANYL CITRATE (PF) 100 MCG/2ML IJ SOLN
INTRAMUSCULAR | Status: DC | PRN
  Administered 2019-12-31 (×2): 50 ug via INTRAVENOUS

## 2019-12-31 SURGICAL SUPPLY — 20 items
CANNULA ANT/CHMB 27G (MISCELLANEOUS) ×2 IMPLANT
CANNULA ANT/CHMB 27GA (MISCELLANEOUS) ×6 IMPLANT
DISSECTOR HYDRO NUCLEUS 50X22 (MISCELLANEOUS) ×3 IMPLANT
GLOVE SURG LX 7.5 STRW (GLOVE) ×2
GLOVE SURG LX STRL 7.5 STRW (GLOVE) ×1 IMPLANT
GLOVE SURG SYN 8.5  E (GLOVE) ×3
GLOVE SURG SYN 8.5 E (GLOVE) ×1 IMPLANT
GLOVE SURG SYN 8.5 PF PI (GLOVE) ×1 IMPLANT
GOWN STRL REUS W/ TWL LRG LVL3 (GOWN DISPOSABLE) ×2 IMPLANT
GOWN STRL REUS W/TWL LRG LVL3 (GOWN DISPOSABLE) ×6
LENS IOL DIOP 20.0 (Intraocular Lens) ×3 IMPLANT
LENS IOL TECNIS MONO 20.0 (Intraocular Lens) IMPLANT
MARKER SKIN DUAL TIP RULER LAB (MISCELLANEOUS) ×3 IMPLANT
PACK DR. KING ARMS (PACKS) ×3 IMPLANT
PACK EYE AFTER SURG (MISCELLANEOUS) ×3 IMPLANT
PACK OPTHALMIC (MISCELLANEOUS) ×3 IMPLANT
SYR 3ML LL SCALE MARK (SYRINGE) ×3 IMPLANT
SYR TB 1ML LUER SLIP (SYRINGE) ×3 IMPLANT
WATER STERILE IRR 250ML POUR (IV SOLUTION) ×3 IMPLANT
WIPE NON LINTING 3.25X3.25 (MISCELLANEOUS) ×3 IMPLANT

## 2019-12-31 NOTE — Anesthesia Preprocedure Evaluation (Signed)
Anesthesia Evaluation  Patient identified by MRN, date of birth, ID band Patient awake    Reviewed: Allergy & Precautions, H&P , NPO status , Patient's Chart, lab work & pertinent test results  History of Anesthesia Complications (+) PONV and history of anesthetic complications  Airway Mallampati: I  TM Distance: >3 FB Neck ROM: full    Dental no notable dental hx.    Pulmonary    Pulmonary exam normal breath sounds clear to auscultation       Cardiovascular hypertension, Normal cardiovascular exam Rhythm:regular Rate:Normal     Neuro/Psych PSYCHIATRIC DISORDERS  Neuromuscular disease    GI/Hepatic   Endo/Other    Renal/GU      Musculoskeletal   Abdominal   Peds  Hematology   Anesthesia Other Findings   Reproductive/Obstetrics                             Anesthesia Physical Anesthesia Plan  ASA: II  Anesthesia Plan: MAC   Post-op Pain Management:    Induction:   PONV Risk Score and Plan: 3 and Treatment may vary due to age or medical condition, TIVA, Midazolam and Ondansetron  Airway Management Planned:   Additional Equipment:   Intra-op Plan:   Post-operative Plan:   Informed Consent: I have reviewed the patients History and Physical, chart, labs and discussed the procedure including the risks, benefits and alternatives for the proposed anesthesia with the patient or authorized representative who has indicated his/her understanding and acceptance.     Dental Advisory Given  Plan Discussed with: CRNA  Anesthesia Plan Comments:         Anesthesia Quick Evaluation

## 2019-12-31 NOTE — Anesthesia Procedure Notes (Signed)
Procedure Name: MAC Date/Time: 12/31/2019 10:52 AM Performed by: Georga Bora, CRNA Pre-anesthesia Checklist: Patient identified, Emergency Drugs available, Suction available, Patient being monitored and Timeout performed Patient Re-evaluated:Patient Re-evaluated prior to induction Oxygen Delivery Method: Nasal cannula

## 2019-12-31 NOTE — Anesthesia Postprocedure Evaluation (Signed)
Anesthesia Post Note  Patient: Jody Turner  Procedure(s) Performed: CATARACT EXTRACTION PHACO AND INTRAOCULAR LENS PLACEMENT (IOC) RIGHT INTRAVITREAL KENALOG INJ 0.64  00:15.9 (Right Eye)     Patient location during evaluation: PACU Anesthesia Type: MAC Level of consciousness: awake and alert and oriented Pain management: satisfactory to patient Vital Signs Assessment: post-procedure vital signs reviewed and stable Respiratory status: spontaneous breathing, nonlabored ventilation and respiratory function stable Cardiovascular status: blood pressure returned to baseline and stable Postop Assessment: Adequate PO intake and No signs of nausea or vomiting Anesthetic complications: no    Raliegh Ip

## 2019-12-31 NOTE — Transfer of Care (Signed)
Immediate Anesthesia Transfer of Care Note  Patient: Jody Turner  Procedure(s) Performed: CATARACT EXTRACTION PHACO AND INTRAOCULAR LENS PLACEMENT (IOC) RIGHT INTRAVITREAL KENALOG INJ 0.64  00:15.9 (Right Eye)  Patient Location: PACU  Anesthesia Type: MAC  Level of Consciousness: awake, alert  and patient cooperative  Airway and Oxygen Therapy: Patient Spontanous Breathing and Patient connected to supplemental oxygen  Post-op Assessment: Post-op Vital signs reviewed, Patient's Cardiovascular Status Stable, Respiratory Function Stable, Patent Airway and No signs of Nausea or vomiting  Post-op Vital Signs: Reviewed and stable  Complications: No apparent anesthesia complications

## 2019-12-31 NOTE — H&P (Signed)

## 2019-12-31 NOTE — Op Note (Addendum)
OPERATIVE NOTE  JADELYN ELKS [id number] 12/31/2019   PREOPERATIVE DIAGNOSIS:   1.  Nuclear sclerotic cataract right eye.  H25.11 2.  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  CPT 26415 Phacoemusification with posterior chamber intraocular lens placement of the right eye  2.  CPT [id number] Intravitreal injection of kenalog, right eye.  LENS:   Implant Name Type Inv. Item Serial No. Manufacturer Lot No. LRB No. Used Action  LENS IOL DIOP 20.0 - [id number] Intraocular Lens LENS IOL DIOP 20.0 [id number]  Right 1 Implanted       Procedure(s): CATARACT EXTRACTION PHACO AND INTRAOCULAR LENS PLACEMENT (IOC) RIGHT INTRAVITREAL KENALOG INJ 0.64  00:15.9 (Right)  DCB00 +20.0   ULTRASOUND TIME: 0 minutes 15 seconds.  CDE 0.64   SURGEON:  Benay Pillow, MD, MPH  ANESTHESIOLOGIST: No anesthesia staff entered.   ANESTHESIA:  Topical with tetracaine drops augmented with 1% preservative-free intracameral lidocaine.  ESTIMATED BLOOD LOSS: less than 1 mL.   COMPLICATIONS:  None.   DESCRIPTION OF PROCEDURE:  The patient was identified in the holding room and transported to the operating room and placed in the supine position under the operating microscope.  The right eye was identified as the operative eye and it was prepped and draped in the usual sterile ophthalmic fashion.   A 1.0 millimeter clear-corneal paracentesis was made at the 10:30 position. 0.5 ml of preservative-free 1% lidocaine with epinephrine was injected into the anterior chamber.  The anterior chamber was filled with Healon 5 viscoelastic.  A 2.4 millimeter keratome was used to make a near-clear corneal incision at the 8:00 position.  A curvilinear capsulorrhexis was made with a cystotome and capsulorrhexis forceps.  Balanced salt solution was used to hydrodissect and hydrodelineate the nucleus.   Phacoemulsification was then used in stop and chop fashion to remove the  lens nucleus and epinucleus.  The remaining cortex was then removed using the irrigation and aspiration handpiece. Healon was then placed into the capsular bag to distend it for lens placement.  A lens was then injected into the capsular bag.  The remaining viscoelastic was aspirated.   Wounds were hydrated with balanced salt solution.  The anterior chamber was inflated to a physiologic pressure with balanced salt solution.   Intracameral vigamox 0.1 mL undiluted was injected into the eye and a drop placed onto the ocular surface.  No wound leaks were noted.    Due to the patient's history of multifocal choroiditis, 3.5 mm posterior to the limbus in the superotemporal quadrant.  0.1 mL of Kenalog 40 mg/mL was injected into the vitreous cavity.   The patient was taken to the recovery room in stable condition without complications of anesthesia or surgery  Benay Pillow 12/31/2019, 11:14 AM

## 2020-01-01 ENCOUNTER — Encounter: Payer: Self-pay | Admitting: *Deleted

## 2020-08-25 DIAGNOSIS — Z79890 Hormone replacement therapy (postmenopausal): Secondary | ICD-10-CM | POA: Insufficient documentation

## 2020-08-25 DIAGNOSIS — J45909 Unspecified asthma, uncomplicated: Secondary | ICD-10-CM | POA: Insufficient documentation

## 2020-08-25 DIAGNOSIS — T7840XA Allergy, unspecified, initial encounter: Secondary | ICD-10-CM | POA: Insufficient documentation

## 2020-08-25 DIAGNOSIS — Z79620 Long term (current) use of immunosuppressive biologic: Secondary | ICD-10-CM | POA: Insufficient documentation

## 2020-08-25 DIAGNOSIS — F1190 Opioid use, unspecified, uncomplicated: Secondary | ICD-10-CM | POA: Insufficient documentation

## 2020-08-25 DIAGNOSIS — F419 Anxiety disorder, unspecified: Secondary | ICD-10-CM | POA: Insufficient documentation

## 2020-08-25 DIAGNOSIS — E559 Vitamin D deficiency, unspecified: Secondary | ICD-10-CM | POA: Insufficient documentation

## 2020-08-25 DIAGNOSIS — Z6841 Body Mass Index (BMI) 40.0 and over, adult: Secondary | ICD-10-CM | POA: Insufficient documentation

## 2020-08-25 DIAGNOSIS — G43909 Migraine, unspecified, not intractable, without status migrainosus: Secondary | ICD-10-CM | POA: Insufficient documentation

## 2020-08-25 DIAGNOSIS — H409 Unspecified glaucoma: Secondary | ICD-10-CM | POA: Insufficient documentation

## 2020-08-25 DIAGNOSIS — Z780 Asymptomatic menopausal state: Secondary | ICD-10-CM | POA: Insufficient documentation

## 2020-08-25 DIAGNOSIS — M797 Fibromyalgia: Secondary | ICD-10-CM | POA: Insufficient documentation

## 2020-08-25 DIAGNOSIS — I10 Essential (primary) hypertension: Secondary | ICD-10-CM | POA: Insufficient documentation

## 2020-09-02 ENCOUNTER — Other Ambulatory Visit: Payer: Self-pay | Admitting: Family Medicine

## 2020-09-02 DIAGNOSIS — Z1231 Encounter for screening mammogram for malignant neoplasm of breast: Secondary | ICD-10-CM

## 2020-10-21 ENCOUNTER — Other Ambulatory Visit: Payer: Self-pay

## 2020-10-21 ENCOUNTER — Ambulatory Visit
Admission: RE | Admit: 2020-10-21 | Discharge: 2020-10-21 | Disposition: A | Discharge status: Home / Self Care | Payer: Medicare HMO | Source: Ambulatory Visit | Attending: Family Medicine | Admitting: Family Medicine

## 2020-10-21 DIAGNOSIS — Z1231 Encounter for screening mammogram for malignant neoplasm of breast: Secondary | ICD-10-CM

## 2020-10-21 IMAGING — MG MM DIGITAL SCREENING BILAT W/ TOMO AND CAD
8 series · 8 of 24 positions shown · non-contrast
Comparison: Previous exam(s).

CLINICAL DATA: Screening.

EXAM:
DIGITAL SCREENING BILATERAL MAMMOGRAM WITH TOMOSYNTHESIS AND CAD
TECHNIQUE: Bilateral screening digital craniocaudal and mediolateral oblique
mammograms were obtained. Bilateral screening digital breast
tomosynthesis was performed. The images were evaluated with
computer-aided detection.

[R CC synth-2D]
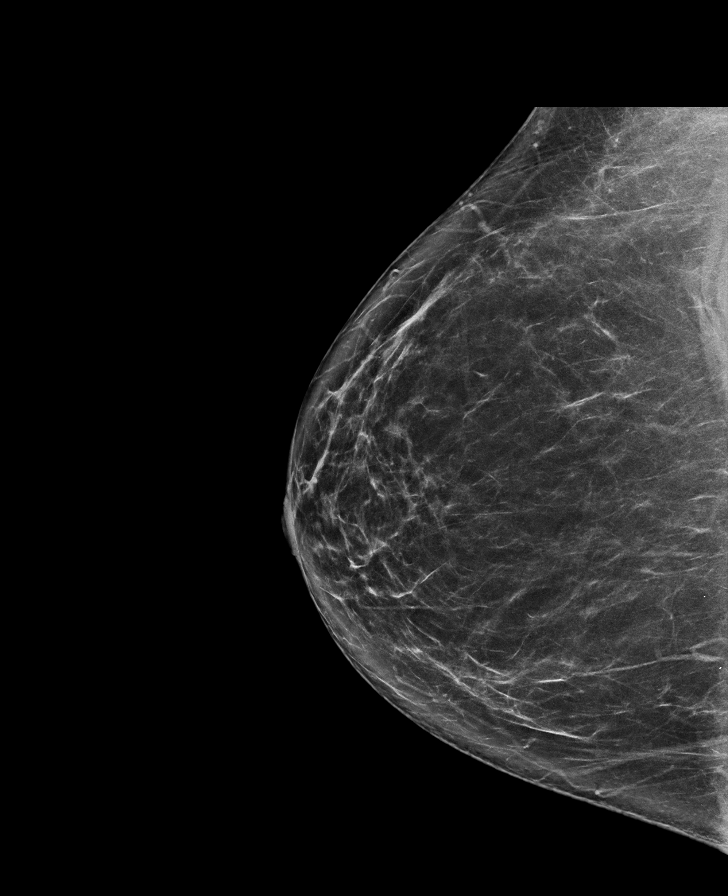

[R MLO synth-2D]
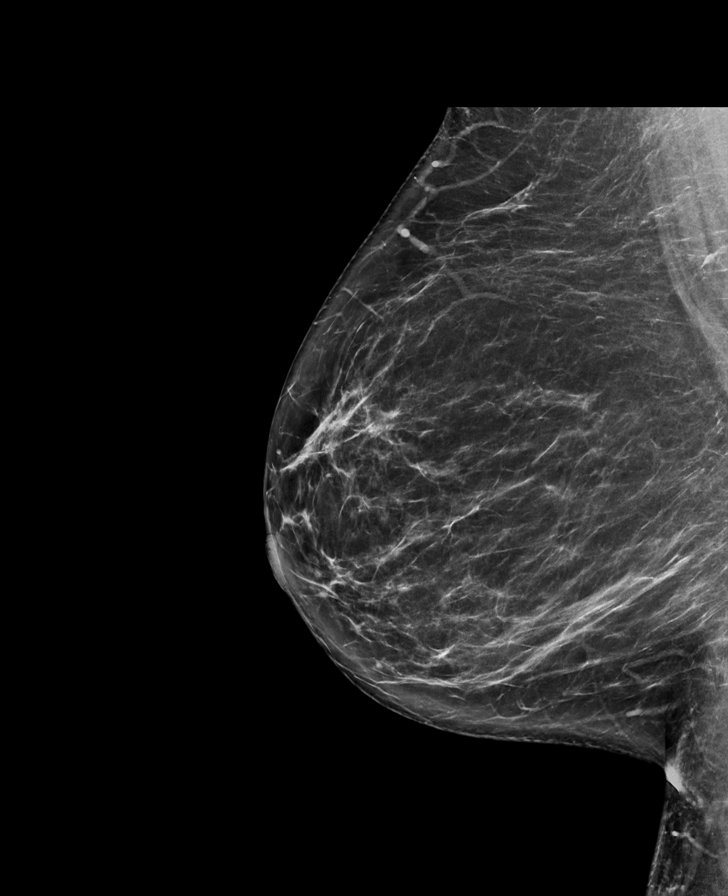

[L MLO synth-2D]
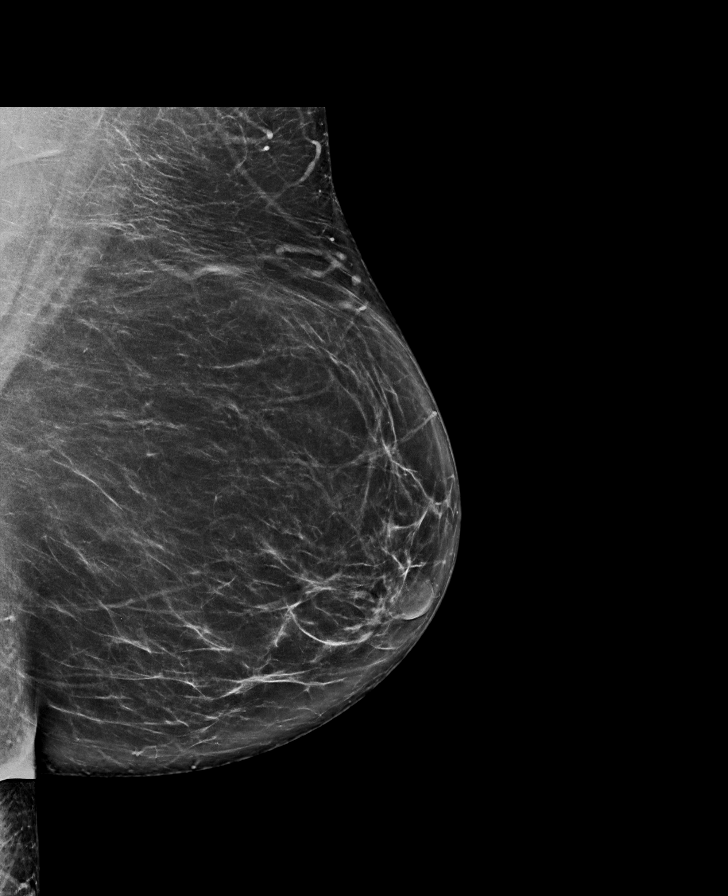

[L CC synth-2D]
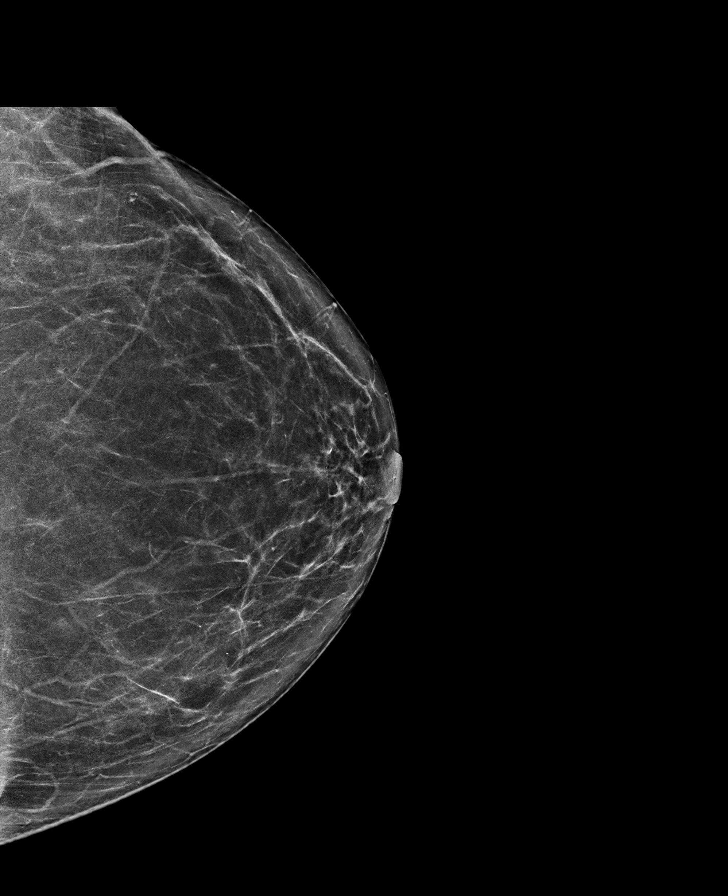

[L MLO tomo · tomo slice 43/86.0]
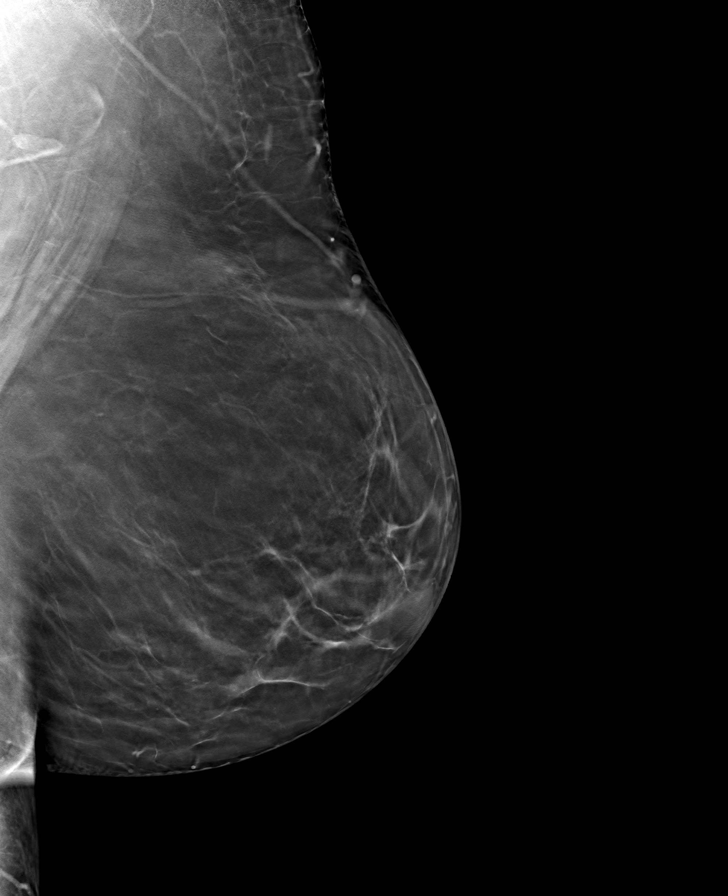

[L CC tomo · tomo slice 40/79.0]
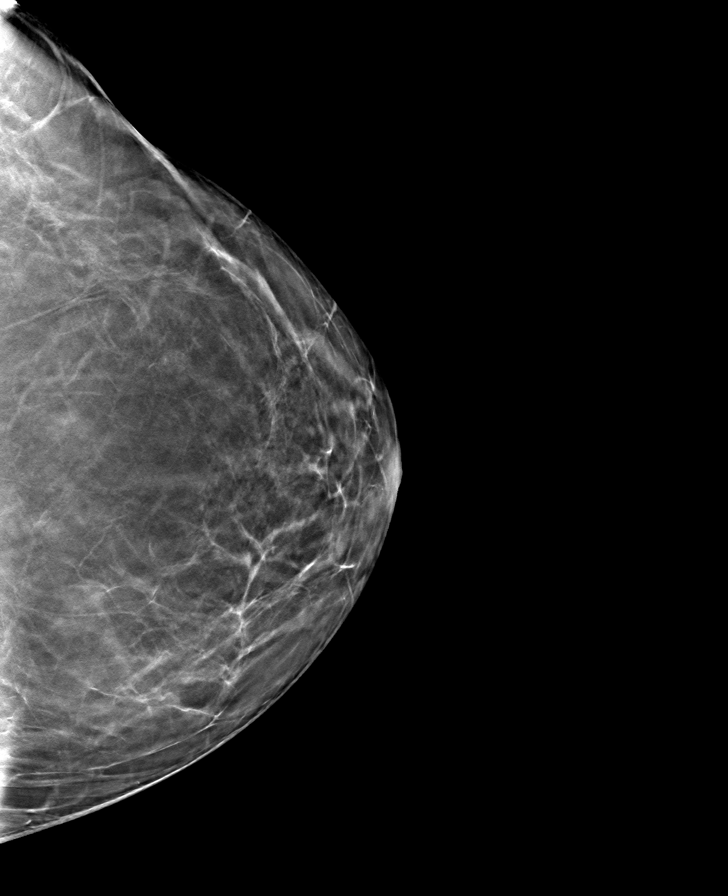

[R CC tomo · tomo slice 42/83.0]
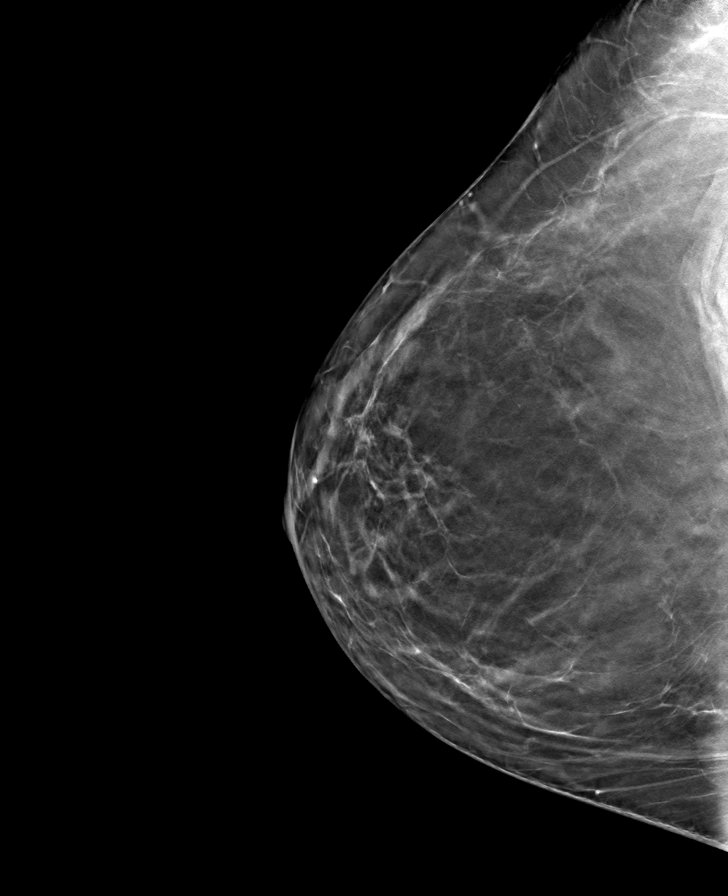

[R MLO tomo · tomo slice 43/85.0]
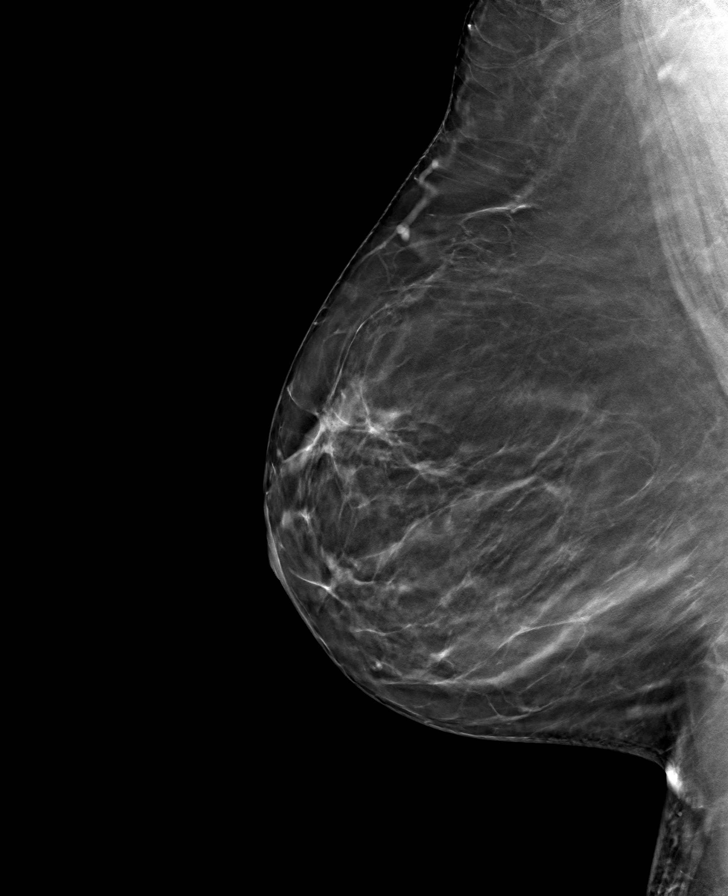

[8 of 24 positions shown; findings below may reference images not displayed]

ACR Breast Density Category b: There are scattered areas of
fibroglandular density.
FINDINGS: There are no findings suspicious for malignancy. The images were
evaluated with computer-aided detection.
IMPRESSION: No mammographic evidence of malignancy. A result letter of this
screening mammogram will be mailed directly to the patient.

RECOMMENDATION:
Screening mammogram in one year. (Code:[OD])

BI-RADS CATEGORY  1: Negative.

## 2021-01-05 ENCOUNTER — Other Ambulatory Visit: Payer: Self-pay

## 2021-01-05 ENCOUNTER — Ambulatory Visit: Payer: Medicare HMO | Admitting: Podiatry

## 2021-01-05 ENCOUNTER — Encounter: Payer: Self-pay | Admitting: Podiatry

## 2021-01-05 DIAGNOSIS — M545 Low back pain, unspecified: Secondary | ICD-10-CM | POA: Insufficient documentation

## 2021-01-05 DIAGNOSIS — S90851A Superficial foreign body, right foot, initial encounter: Secondary | ICD-10-CM

## 2021-01-05 DIAGNOSIS — F32A Depression, unspecified: Secondary | ICD-10-CM | POA: Insufficient documentation

## 2021-01-05 DIAGNOSIS — M76899 Other specified enthesopathies of unspecified lower limb, excluding foot: Secondary | ICD-10-CM | POA: Insufficient documentation

## 2021-01-05 DIAGNOSIS — G4700 Insomnia, unspecified: Secondary | ICD-10-CM | POA: Insufficient documentation

## 2021-01-05 DIAGNOSIS — M719 Bursopathy, unspecified: Secondary | ICD-10-CM | POA: Insufficient documentation

## 2021-01-05 MED ORDER — CEPHALEXIN 500 MG PO CAPS: 500.0000 mg | ORAL_CAPSULE | Freq: Three times a day (TID) | ORAL | 0 refills | Status: AC

## 2021-01-05 NOTE — Patient Instructions (Signed)
Call and let me know if it is still giving you trouble in 1-2 weeks

## 2021-01-05 NOTE — Progress Notes (Signed)
  Subjective:  Patient ID: Jody Turner, female    DOB: 1961-01-19,  MRN: [id number]  Chief Complaint  Patient presents with   Callouses    Callus verses foreign body in foot    60 y.o. female presents with the above complaint. History confirmed with patient.  Start about 2 weeks ago she think she stepped on a splinter or pine needle.  Her daughter was able to open it and pull out a small piece.  It drained some pus.  Its been very sore since then.  He keeps closing over.  Objective:  Physical Exam: warm, good capillary refill, no trophic changes or ulcerative lesions, normal DP and PT pulses, and normal sensory exam.  Right Foot: Small callus scab, debridement of this reveals a small hair splinter which is extruded through the wound and small pinpoint area of serous drainage.  No cellulitis.  No exposed subcutaneous tissue.  Tiny sinus tract but does not probe beyond area of concern  Assessment:   1. Superficial foreign body of right foot, initial encounter      Plan:  Patient was evaluated and treated and all questions answered.  I was able to debride the overlying scab and extrude a small hair splinter.  Is possible this was what was residual causing the issue.  Was not able to detect any deep sinus tract or large foreign body.  Recommended soaking in Epsom salt to help to allow drainage out.  Prescribed Keflex for 5 days for any residual infection.  If it still bothers her in a week or 2 would consider ultrasound or MRI and  surgical excision.  Return if symptoms worsen or fail to improve.

## 2021-06-05 ENCOUNTER — Other Ambulatory Visit: Payer: Self-pay | Admitting: Family Medicine

## 2021-06-05 DIAGNOSIS — M542 Cervicalgia: Secondary | ICD-10-CM

## 2021-06-25 ENCOUNTER — Other Ambulatory Visit: Payer: Self-pay

## 2021-06-25 ENCOUNTER — Ambulatory Visit
Admission: RE | Admit: 2021-06-25 | Discharge: 2021-06-25 | Disposition: A | Discharge status: Home / Self Care | Payer: Medicare HMO | Source: Ambulatory Visit | Attending: Family Medicine | Admitting: Family Medicine

## 2021-06-25 DIAGNOSIS — M542 Cervicalgia: Secondary | ICD-10-CM

## 2021-07-01 ENCOUNTER — Other Ambulatory Visit: Payer: Self-pay | Admitting: Family Medicine

## 2021-07-01 DIAGNOSIS — M5430 Sciatica, unspecified side: Secondary | ICD-10-CM

## 2021-07-01 DIAGNOSIS — G894 Chronic pain syndrome: Secondary | ICD-10-CM

## 2021-07-07 ENCOUNTER — Other Ambulatory Visit: Payer: Self-pay | Admitting: Obstetrics and Gynecology

## 2021-07-07 ENCOUNTER — Other Ambulatory Visit: Payer: Self-pay

## 2021-07-07 ENCOUNTER — Ambulatory Visit
Admission: RE | Admit: 2021-07-07 | Discharge: 2021-07-07 | Disposition: A | Discharge status: Home / Self Care | Payer: Medicare HMO | Source: Ambulatory Visit | Attending: Family Medicine | Admitting: Family Medicine

## 2021-07-07 DIAGNOSIS — M5430 Sciatica, unspecified side: Secondary | ICD-10-CM

## 2021-07-07 DIAGNOSIS — Z1239 Encounter for other screening for malignant neoplasm of breast: Secondary | ICD-10-CM

## 2021-07-07 DIAGNOSIS — G894 Chronic pain syndrome: Secondary | ICD-10-CM

## 2021-07-07 IMAGING — MR MR CERVICAL SPINE WO/W CM
8 series · 42 of 48 positions shown · IV contrast (multihance)
Comparison: None.

CLINICAL DATA: Neck pain and bilateral arm pain with numbness for 1
year. History of prior cervical fusion at C5-6.

EXAM:
MRI CERVICAL SPINE WITHOUT AND WITH CONTRAST
TECHNIQUE: Multiplanar and multiecho pulse sequences of the cervical spine, to
include the craniocervical junction and cervicothoracic junction,
were obtained without and with intravenous contrast.
CONTRAST:  20mL MULTIHANCE GADOBENATE DIMEGLUMINE 529 MG/ML IV SOLN

[Series 6: T1 · sagittal · 3.0mm · 0.86mm/px · 4 of 13 slices shown (1 of 2)]
[im 1/13]
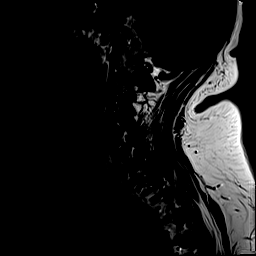
[im 5/13]
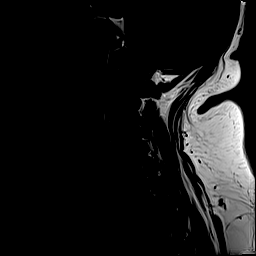
[im 9/13]
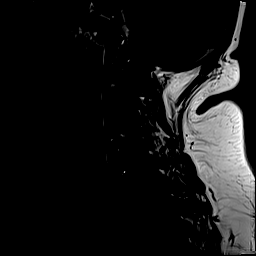
[im 13/13]
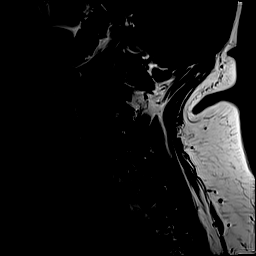

[Series 7: STIR · sagittal · 3.0mm · 0.34mm/px · 4 of 13 slices shown]
[im 1/13]
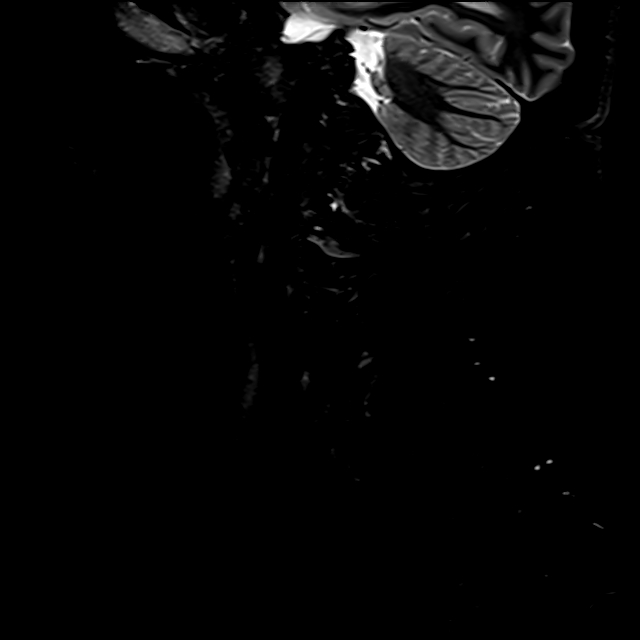
[im 5/13]
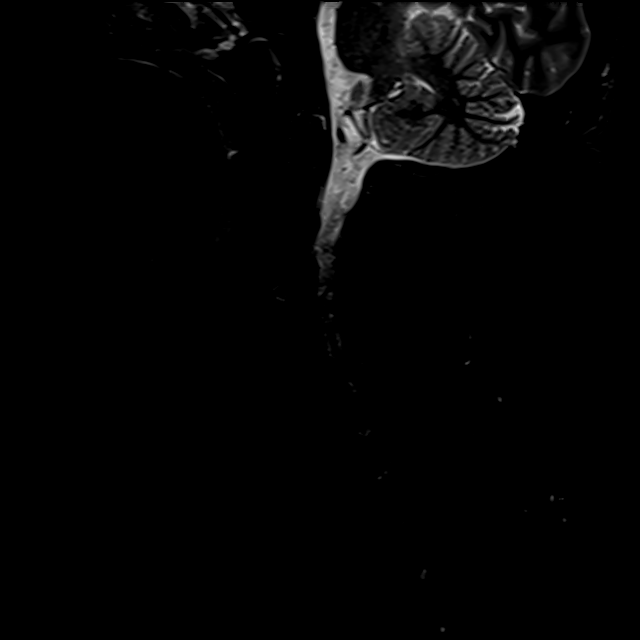
[im 9/13]
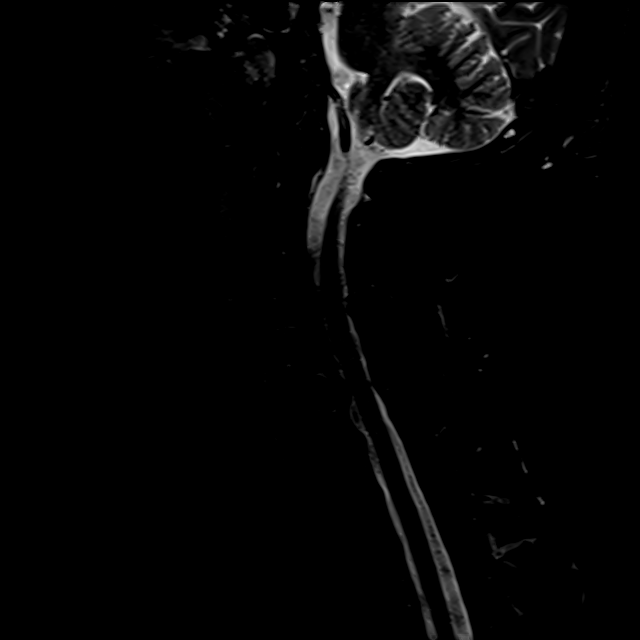
[im 13/13]
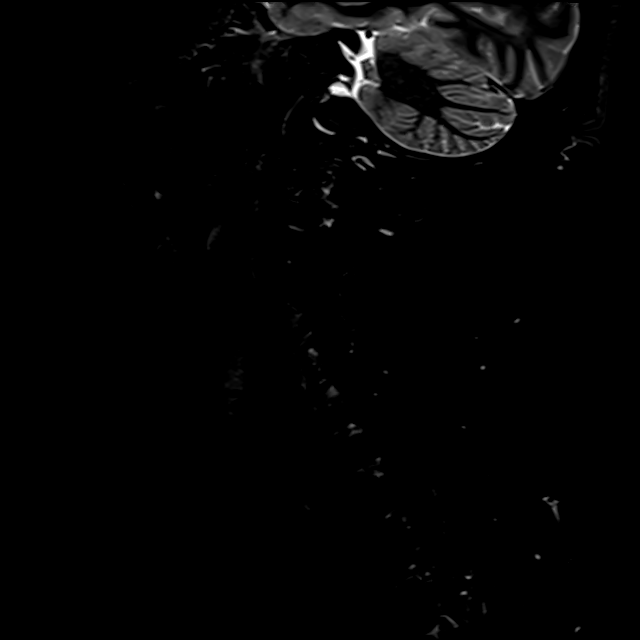

[Series 8: T2 · sagittal · 3.0mm · 0.69mm/px · 4 of 13 slices shown (1 of 2)]
[im 1/13]
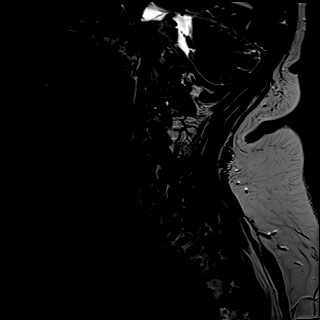
[im 5/13]
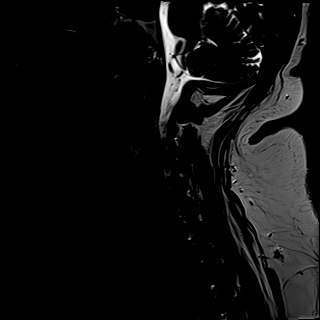
[im 9/13]
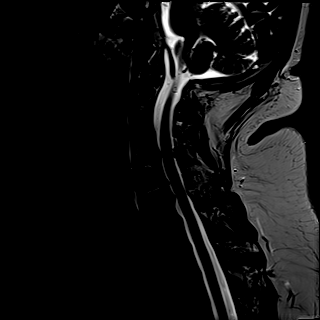
[im 13/13]
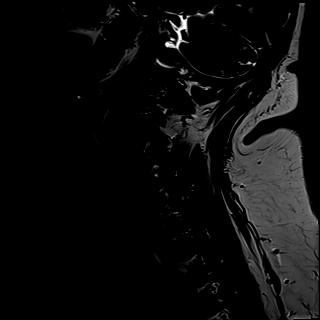

[Series 9: T2 · axial · 3.0mm · 0.70mm/px · z∈[-44,+54]mm · 8 of 31 slices shown (2 of 2)]
[im 1/31]
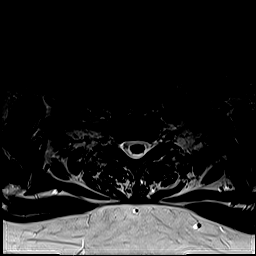
[im 5/31]
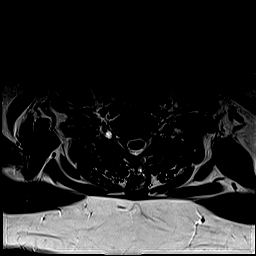
[im 9/31]
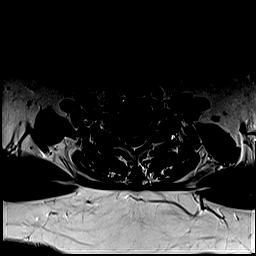
[im 13/31]
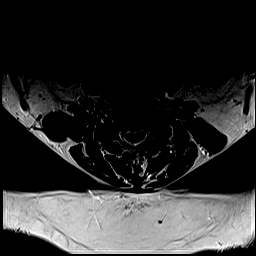
[im 18/31]
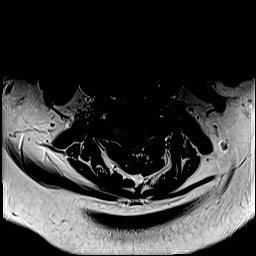
[im 22/31]
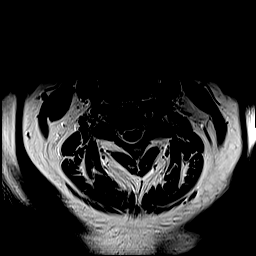
[im 26/31]
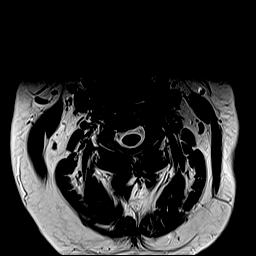
[im 31/31]
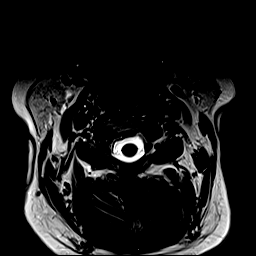

[Series 10: GRE · axial · 3.0mm · 0.35mm/px · z∈[-44,-31]mm · 2 of 31 slices shown]
[im 1/31]
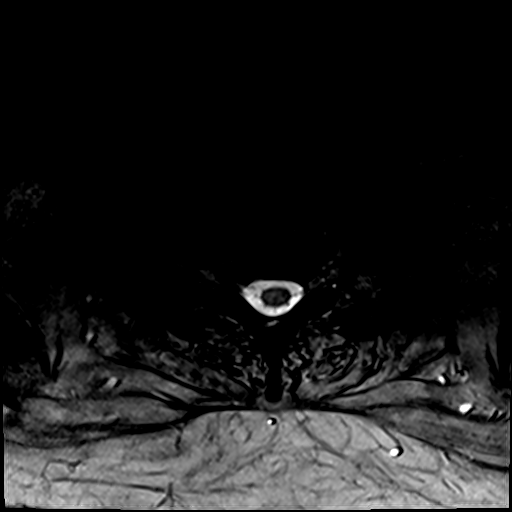
[im 5/31]
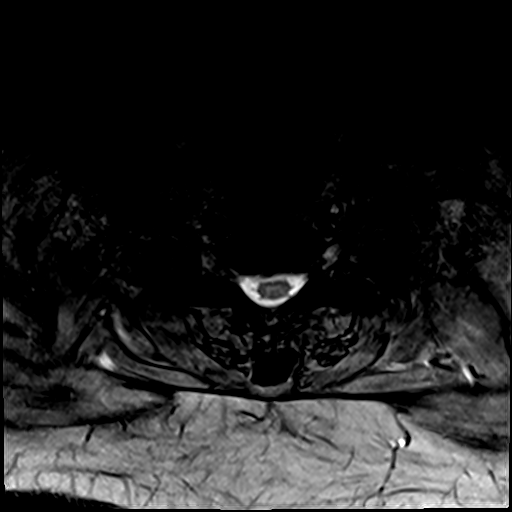

[Series 11: T1 · axial · 3.0mm · 0.70mm/px · z∈[-44,+54]mm · 8 of 31 slices shown (2 of 2)]
[im 1/31]
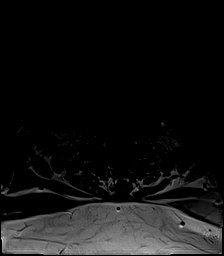
[im 5/31]
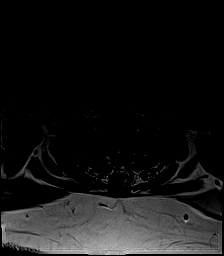
[im 9/31]
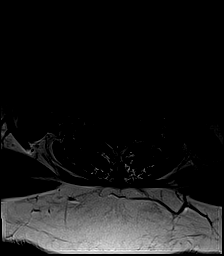
[im 13/31]
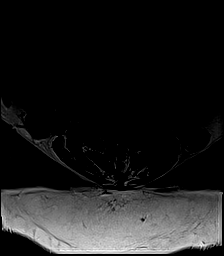
[im 18/31]
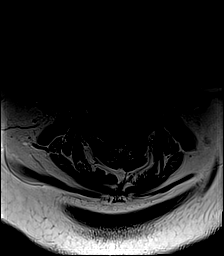
[im 22/31]
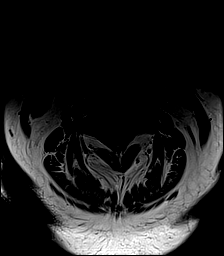
[im 26/31]
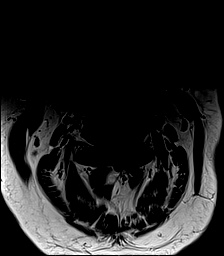
[im 31/31]
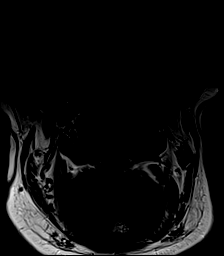

[Series 12: T1 fat-sat post-contrast · sagittal · 3.0mm · 0.86mm/px · 4 of 13 slices shown]
[im 1/13]
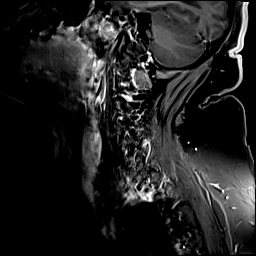
[im 5/13]
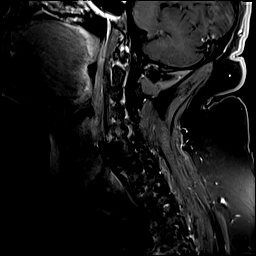
[im 9/13]
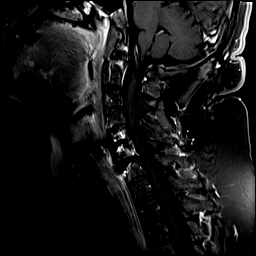
[im 13/13]
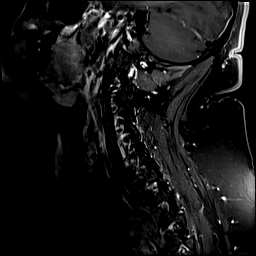

[Series 13: T1 post-contrast · axial · 3.0mm · 0.70mm/px · z∈[-44,+54]mm · 8 of 31 slices shown]
[im 1/31]
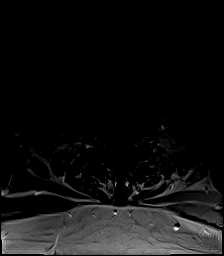
[im 5/31]
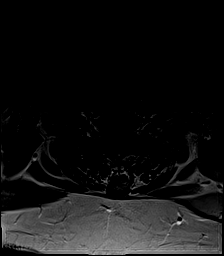
[im 9/31]
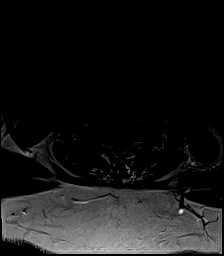
[im 13/31]
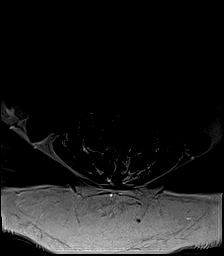
[im 18/31]
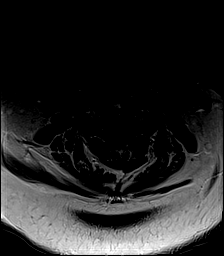
[im 22/31]
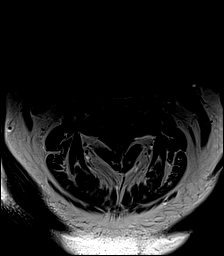
[im 26/31]
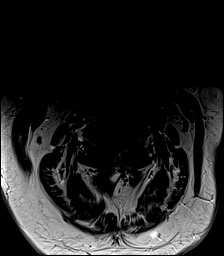
[im 31/31]
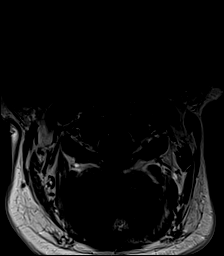

[42 of 48 positions shown; findings below may reference images not displayed]

FINDINGS: Alignment: Normal

Vertebrae: Normal marrow signal.  No bone lesions or fractures.

Cord: Normal cord signal intensity.  No cord lesions or syrinx.

Posterior Fossa, vertebral arteries, paraspinal tissues: No
significant findings.

Disc levels:

C2-3: Shallow right-sided disc osteophyte complex with mild
impression on the right side of thecal sac and medial foraminal
encroachment.

C3-4: Mild annular bulge and osteophytic ridging and uncinate
spurring. Slight flattening of the ventral thecal sac and mild
foraminal narrowing bilaterally.

C4-5: Broad-based bulging degenerated annulus and osteophytic
ridging with mass effect on the ventral thecal sac and narrowing of
the ventral CSF space. There is also bilateral facet disease, right
greater than left. Moderate right and mild left foraminal stenosis.

C5-6: Anterior and interbody fusion changes. No significant spinal
or foraminal stenosis. Mild osteophytic ridging noted posteriorly.

C6-7: Broad-based bulging degenerated annulus, osteophytic ridging
and uncinate spurring. There is mass effect on the ventral thecal
sac and narrowing the ventral CSF space. No significant foraminal
stenosis.

C7-T1: No significant findings.
IMPRESSION: 1. Shallow right-sided disc osteophyte complex at C2-3.
2. Mild bilateral foraminal narrowing at C3-4.
3. Moderate right and mild left foraminal stenosis at C4-5.
4. Anterior and interbody fusion changes at C5-6. No significant
spinal or foraminal stenosis.
5. Broad-based bulging degenerated discs at C4-5 and C6-7 with mild
spinal stenosis.

## 2021-07-07 IMAGING — MR MR LUMBAR SPINE WO/W CM
5 of 7 series · 33 of 48 positions shown · IV contrast (20 mL Multihance)
Comparison: CT myelogram [DATE]

CLINICAL DATA: Pain and decreased mobility

EXAM:
MRI LUMBAR SPINE WITHOUT AND WITH CONTRAST
TECHNIQUE: Multiplanar and multiecho pulse sequences of the lumbar spine were
obtained without and with intravenous contrast.
CONTRAST:  20mL MULTIHANCE GADOBENATE DIMEGLUMINE 529 MG/ML IV SOLN

[Series 3: T1 · sagittal · 4.0mm · 0.41mm/px · 3 of 13 slices shown (1 of 2)]
[im 1/13]
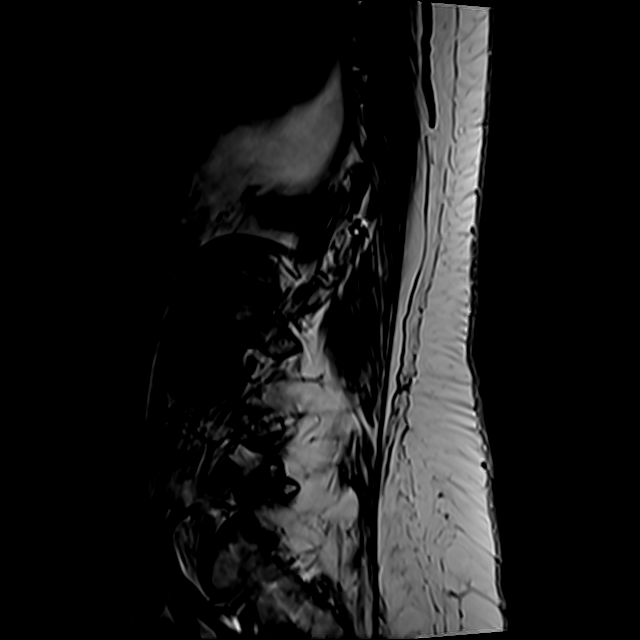
[im 7/13]
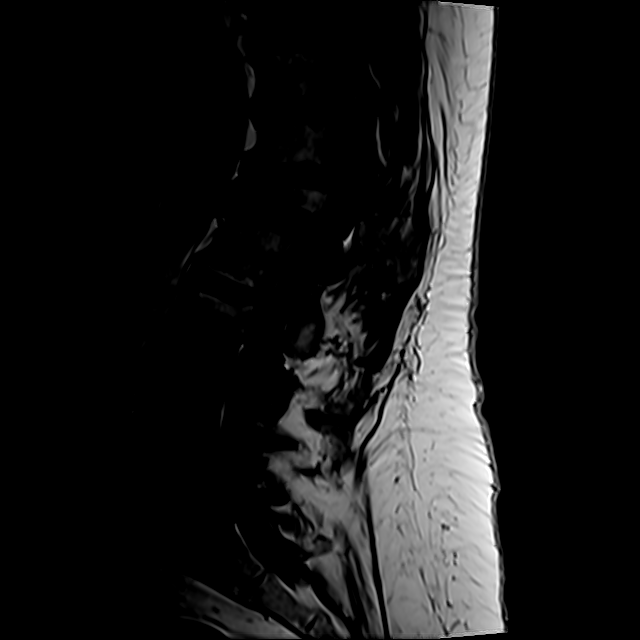
[im 13/13]
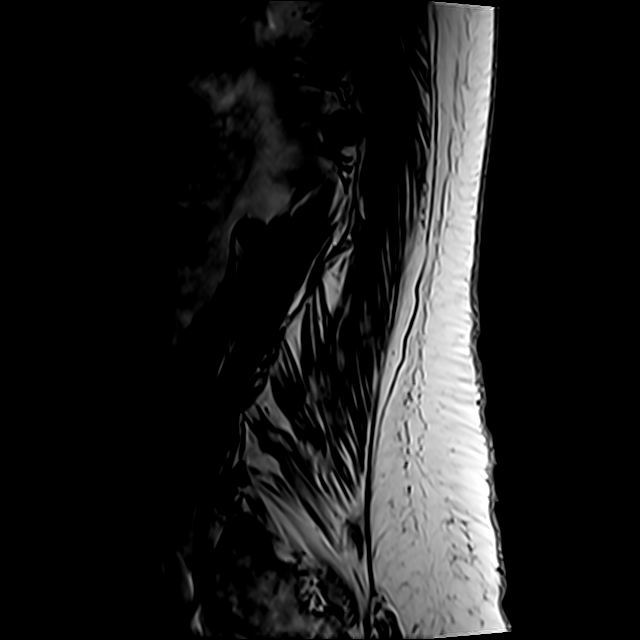

[Series 5: T1 · axial · 4.0mm · 0.78mm/px · z∈[-439,-230]mm · 11 of 41 slices shown (2 of 2)]
[im 1/41]
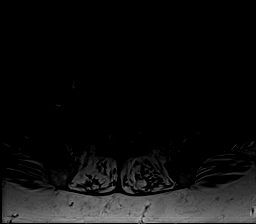
[im 5/41]
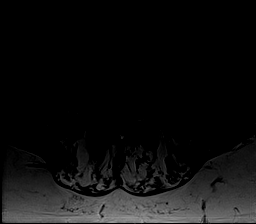
[im 9/41]
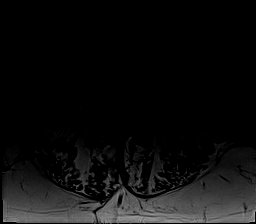
[im 13/41]
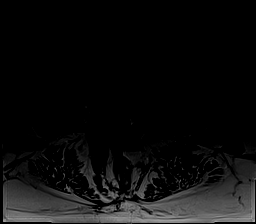
[im 17/41]
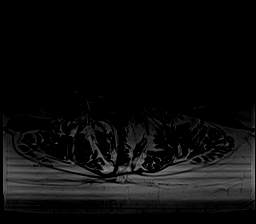
[im 21/41]
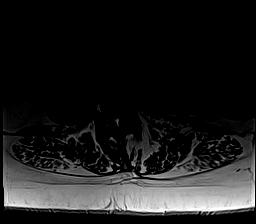
[im 25/41]
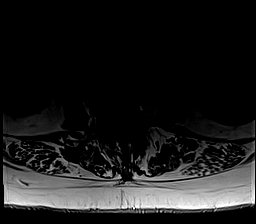
[im 29/41]
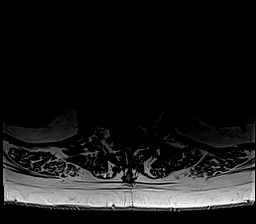
[im 33/41]
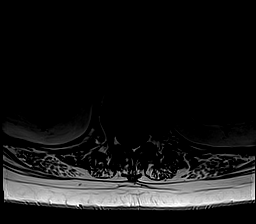
[im 37/41]
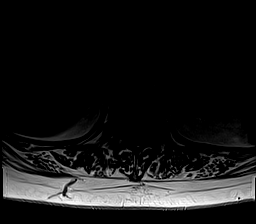
[im 41/41]
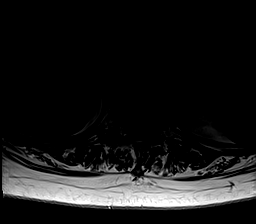

[Series 6: T2 · axial · 4.0mm · 0.78mm/px · z∈[-439,-229]mm · 11 of 41 slices shown (1 of 2)]
[im 1/41]
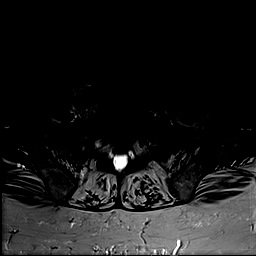
[im 5/41]
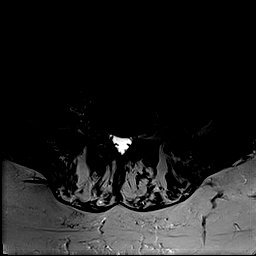
[im 9/41]
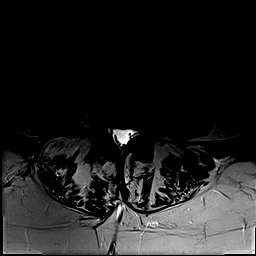
[im 13/41]
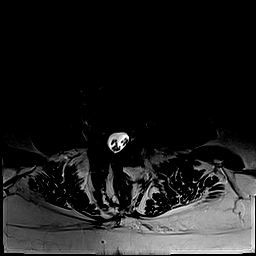
[im 17/41]
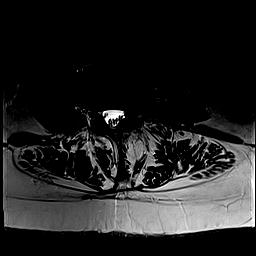
[im 21/41]
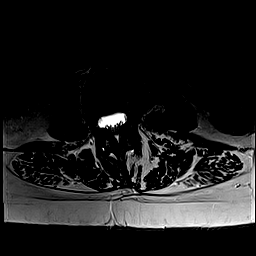
[im 25/41]
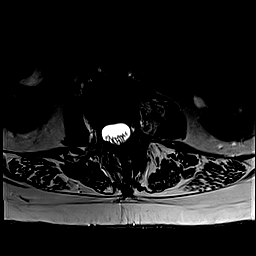
[im 29/41]
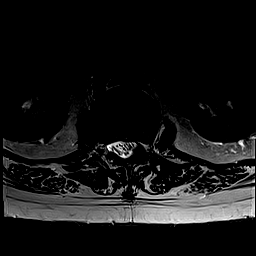
[im 33/41]
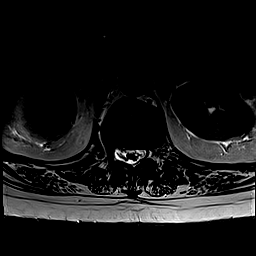
[im 37/41]
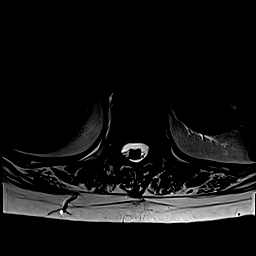
[im 41/41]
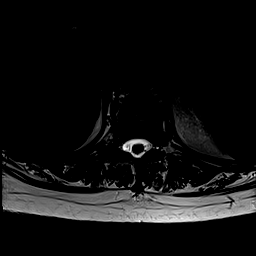

[Series 7: T2 · sagittal · 4.0mm · 1.02mm/px · 4 of 13 slices shown (2 of 2)]
[im 1/13]
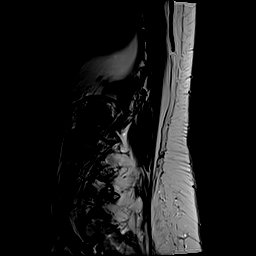
[im 5/13]
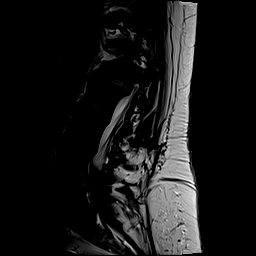
[im 9/13]
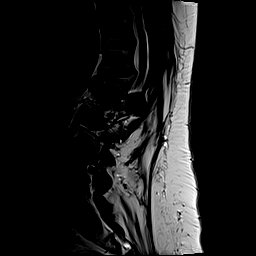
[im 13/13]
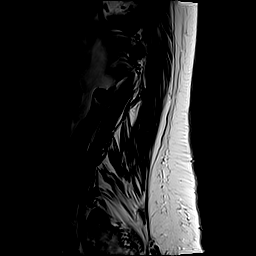

[Series 8: T1 fat-sat post-contrast · sagittal · 4.0mm · 0.41mm/px · 4 of 13 slices shown]
[im 1/13]
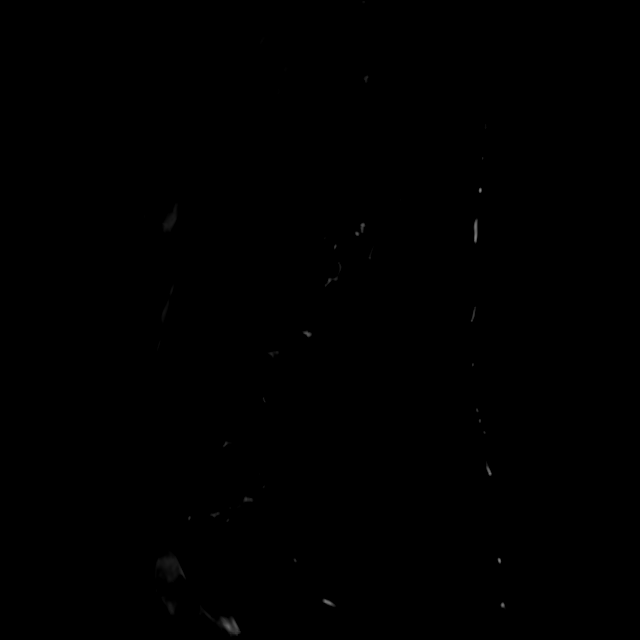
[im 5/13]
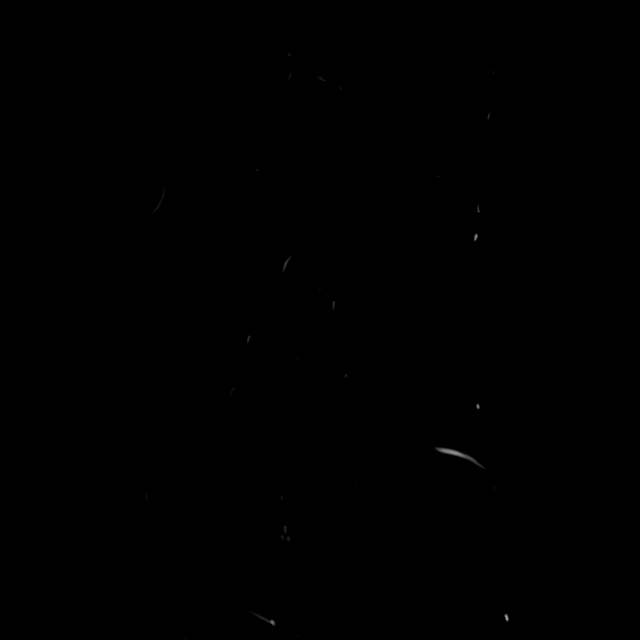
[im 9/13]
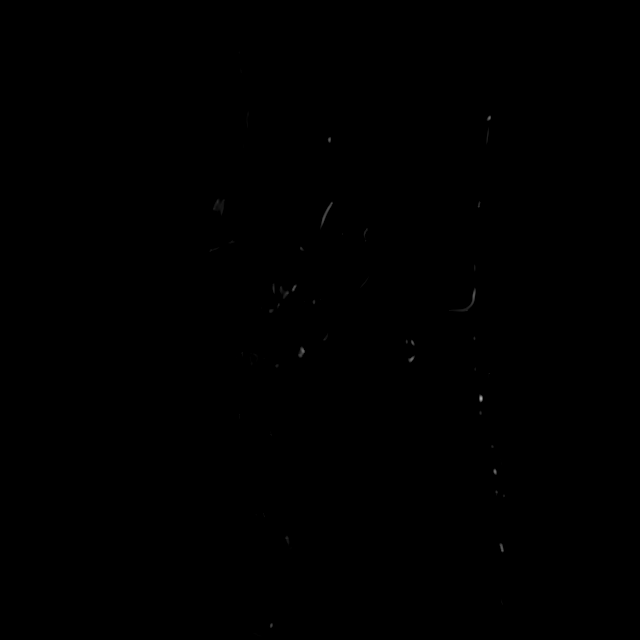
[im 13/13]
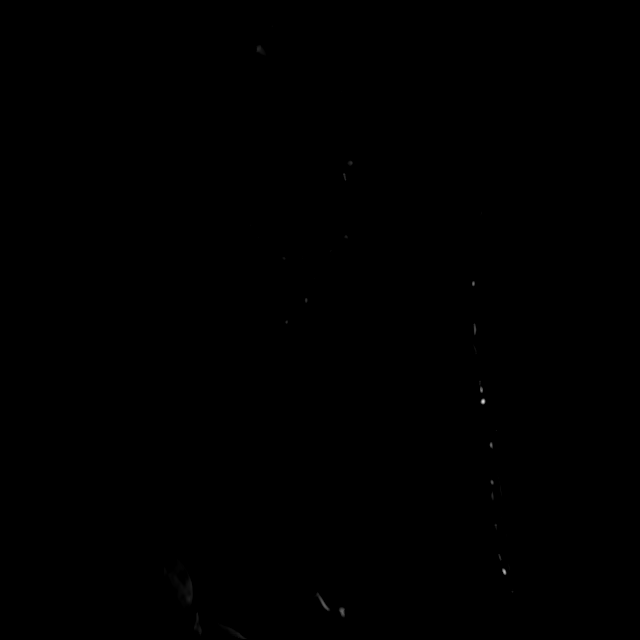

[33 of 48 positions shown; findings below may reference images not displayed]

FINDINGS: Segmentation: 5 lumbar type vertebral bodies as numbered previously.

Alignment: Scoliotic curvature convex to the right with the apex at
L3. Increased kyphotic curvature at the thoracolumbar junction. 2 mm
retrolisthesis L1-2.

Vertebrae: Old healed superior endplate depression to the right of
midline at T12. Old healed wedge compression deformity at L1.

Conus medullaris and cauda equina: Conus extends to the L1 level.
Conus and cauda equina appear normal.

Paraspinal and other soft tissues: Negative

Disc levels:

T11-12: Minimal disc bulge.  No stenosis.

T12-L1: Mild disc bulge. Narrowing of the ventral subarachnoid space
but no compressive stenosis.

L1-2: Retrolisthesis of 2 mm. Endplate osteophytes and bulging of
the disc more prominent towards the left. Stenosis of the left
lateral recess that could possibly affect the left L2 nerve.

L2-3: Mild bulging of the disc. Mild facet hypertrophy on the left.
Mild left lateral recess narrowing without neural compression.

L3-4: Mild bulging of the disc. Mild facet hypertrophy. Previous
partial hemilaminectomy on the right. No compressive stenosis.

L4-5: Mild bulging of the disc. Facet and ligamentous hypertrophy
worse on the right. Narrowing of the subarticular lateral recess on
the right but without visible neural compression. Some potential the
right L5 nerve could be affected however. The facet arthritis could
be painful.

L5-S1: Mild bulging of the disc. Bilateral facet osteoarthritis. No
compressive stenosis. The facet arthritis could be painful.
IMPRESSION: Scoliotic curvature convex to the right with the apex at L3.

Old healed minor superior endplate fracture to the right of midline
at T12. Old healed wedge compression fracture at L1.

L1-2: Left lateral recess stenosis due to encroachment by endplate
osteophytes, bulging of the disc and mild facet and ligamentous
hypertrophy. Some potential the left L2 nerve could be affected.

Previous partial right hemilaminectomy at L3-4. No compressive
stenosis at that level presently.

L4-5: Mild bulging of the disc. Facet and ligamentous hypertrophy
worse on the right. Stenosis of the right lateral recess that could
possibly affect the right L5 nerve. The facet arthritis could be
painful.

L5-S1: Bilateral facet arthritis. No compressive stenosis. The facet
arthritis could be painful.

## 2021-07-07 MED ORDER — GADOBENATE DIMEGLUMINE 529 MG/ML IV SOLN
20.0000 mL | Freq: Once | INTRAVENOUS | Status: AC | PRN
  Administered 2021-07-07: 20 mL via INTRAVENOUS

## 2021-08-08 ENCOUNTER — Encounter: Payer: Self-pay | Admitting: Emergency Medicine

## 2021-08-08 ENCOUNTER — Other Ambulatory Visit: Payer: Self-pay

## 2021-08-08 ENCOUNTER — Ambulatory Visit
Admission: EM | Admit: 2021-08-08 | Discharge: 2021-08-08 | Disposition: A | Discharge status: Home / Self Care | Payer: Medicare HMO | Attending: Emergency Medicine | Admitting: Emergency Medicine

## 2021-08-08 DIAGNOSIS — J069 Acute upper respiratory infection, unspecified: Secondary | ICD-10-CM | POA: Diagnosis not present

## 2021-08-08 MED ORDER — BENZONATATE 100 MG PO CAPS: 200.0000 mg | ORAL_CAPSULE | Freq: Three times a day (TID) | ORAL | 0 refills | Status: DC

## 2021-08-08 MED ORDER — MOLNUPIRAVIR EUA 200MG CAPSULE: 4.0000 | ORAL_CAPSULE | Freq: Two times a day (BID) | ORAL | 0 refills | Status: AC

## 2021-08-08 MED ORDER — IPRATROPIUM BROMIDE 0.06 % NA SOLN: 2.0000 | Freq: Four times a day (QID) | NASAL | 12 refills | Status: AC

## 2021-08-08 NOTE — ED Provider Notes (Signed)
[facility]    CSN: [id number] Arrival date & time: 08/08/21  1115      History   Chief Complaint Chief Complaint  Patient presents with   Headache    COVID Exposure   Cough    HPI LATANYA HEMMER is a 61 y.o. female.   HPI  61 year old female here for evaluation of respiratory symptoms.  Patient reports that last evening she developed a headache, nasal congestion, postnasal drip, sore throat, nonproductive cough, and body aches.  She denies fever, runny nose or nasal discharge, shortness breath or wheezing, or GI complaints.  Her husband is currently being treated for COVID and tested +2 days before her symptoms started.  She is requesting treatment for potential COVID-19.  She does have significant medical history to include hypertension, arthritis, fibromyalgia, and cancer.  Patient is also on Humira.  Past Medical History:  Diagnosis Date   Arthritis    HNP- lumbar, DJD, spondylosis   Cancer (HCC)    r side of neck- basal cell ca   Depression    depression, trouble sleeping    Fibromyalgia    no medical treatment - treats naturally /w exercise    Hypertension    Osteoarthritis     Patient Active Problem List   Diagnosis Date Noted   Depressive disorder 01/05/2021   Disorder of bursae of shoulder region 01/05/2021   Enthesopathy of hip region 01/05/2021   Insomnia 01/05/2021   Low back pain 01/05/2021   Adalimumab (Humira) long-term use 08/25/2020   Allergies 08/25/2020   Anxiety 08/25/2020   Asthma 08/25/2020   Essential (primary) hypertension 08/25/2020   Fibromyalgia 08/25/2020   Glaucoma 08/25/2020   Hormone replacement therapy 08/25/2020   Migraines 08/25/2020   Morbid obesity with BMI of 40.0-44.9, adult ([place]) 08/25/2020   Narcotic drug use 08/25/2020   Postmenopausal 08/25/2020   Vitamin D deficiency 08/25/2020   Primary osteoarthritis of right knee 11/07/2019   High risk medication use 12/13/2017   Nuclear sclerotic cataract of both  eyes 12/13/2017   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   Posterior synechiae (iris), left eye 12/13/2017   Retinal edema 12/13/2017   Chronic neck and back pain 04/05/2017   S/P cervical spinal fusion 04/05/2017   S/P lumbar laminectomy 04/05/2017   Onychomycosis 05/01/2016   Chronic left shoulder pain 10/06/2015   Greater trochanteric bursitis 08/21/2015   DDD (degenerative disc disease), lumbar 12/29/2014   DDD (degenerative disc disease), cervical 12/29/2014   Sacroiliac joint disease 12/29/2014   Lumbar facet arthropathy 12/29/2014   Bilateral occipital neuralgia 12/29/2014    Past Surgical History:  Procedure Laterality Date   BACK SURGERY     corpectomy, cerv. fusion -2004   CARPAL TUNNEL RELEASE Right    CATARACT EXTRACTION W/PHACO Left 11/26/2019   Procedure: CATARACT EXTRACTION PHACO AND INTRAOCULAR LENS PLACEMENT (IOC) LEFT INJECTION OF INTRAVITREAL KENALOG;  Surgeon: Eulogio Bear, MD;  Location: [facility];  Service: Ophthalmology;  Laterality: Left;  1.18 0:21.7   CATARACT EXTRACTION W/PHACO Right 12/31/2019   Procedure: CATARACT EXTRACTION PHACO AND INTRAOCULAR LENS PLACEMENT (IOC) RIGHT INTRAVITREAL KENALOG INJ 0.64  00:15.9;  Surgeon: Eulogio Bear, MD;  Location: [facility];  Service: Ophthalmology;  Laterality: Right;   FOOT OSTEOTOMY W/ PLANTAR FASCIA RELEASE     R foot, [place] Evans procedure    LASER ABLATION     both legs, sclerotherapy    LUMBAR LAMINECTOMY/DECOMPRESSION  MICRODISCECTOMY  09/17/2011   Procedure: LUMBAR LAMINECTOMY/DECOMPRESSION MICRODISCECTOMY;  Surgeon: Floyce Stakes, MD;  Location: [facility];  Service: Neurosurgery;  Laterality: N/A;  Left Lumbar three-four, Right Lumbar four-five Discectomies   TONSILLECTOMY     as a child    VAGINAL DELIVERY     VARICOSE VEIN SURGERY     segmental, bilateral     OB History   No obstetric history on  file.      Home Medications    Prior to Admission medications   Medication Sig Start Date End Date Taking? Authorizing Provider  benzonatate (TESSALON) 100 MG capsule Take 2 capsules (200 mg total) by mouth every 8 (eight) hours. 08/08/21  Yes Margarette Canada, NP  ipratropium (ATROVENT) 0.06 % nasal spray Place 2 sprays into both nostrils 4 (four) times daily. 08/08/21  Yes Margarette Canada, NP  molnupiravir EUA (LAGEVRIO) 200 mg CAPS capsule Take 4 capsules (800 mg total) by mouth 2 (two) times daily for 5 days. 08/08/21 08/13/21 Yes Margarette Canada, NP  Adalimumab (HUMIRA PEN) 40 MG/0.4ML PNKT Inject 80mg  SQ on Day 1, 40mg  on Day 8, then 40mg  every other week Quantity 4 Maintenance: Inject 40mg  SQ every other week Quantity 2 02/10/18   [provider]  albuterol (PROVENTIL HFA;VENTOLIN HFA) 108 (90 Base) MCG/ACT inhaler Inhale into the lungs. 11/19/16   [provider]  brimonidine (ALPHAGAN) 0.2 % ophthalmic solution Place 1 drop into both eyes every 8 hours. 11/11/20   [provider]  busPIRone (BUSPAR) 15 MG tablet Take by mouth. 06/12/20   [provider]  BUSPIRONE HCL PO Take by mouth.    [provider]  citalopram (CELEXA) 20 MG tablet Take 20 mg by mouth daily.    [provider]  cycloSPORINE modified (NEORAL) 25 MG capsule Take 75 mg by mouth 2 (two) times daily. 12/07/20   [provider]  diclofenac (VOLTAREN) 75 MG EC tablet Take by mouth.    [provider]  diclofenac sodium (VOLTAREN) 1 % GEL Apply 2 g topically 4 (four) times daily. Rub into affected area of foot 2 to 4 times daily 10/20/16   Trula Slade, DPM  esomeprazole (NEXIUM) 20 MG capsule Take by mouth.    [provider]  estradiol (VIVELLE-DOT) 0.025 MG/24HR estradiol 0.025 mg/24 hr semiweekly transdermal patch 06/11/20   [provider]  fluticasone (FLONASE) 50 MCG/ACT nasal spray Place into the nose. 11/19/16   [provider]   folic acid (FOLVITE) 1 MG tablet Take 1 tablet by mouth daily. 02/10/18   [provider]  HYDROcodone-acetaminophen (NORCO/VICODIN) 5-325 MG tablet Limit one half to one tablet by mouth per day if tolerated 03/08/16   Mohammed Kindle, MD  lansoprazole (PREVACID) 15 MG capsule Take by mouth.    [provider]  lidocaine (LIDODERM) 5 % PLEASE SEE ATTACHED FOR DETAILED DIRECTIONS 10/08/20   [provider]  Loratadine 10 MG CAPS Take by mouth.    [provider]  meloxicam (MOBIC) 7.5 MG tablet meloxicam 7.5 mg tablet  Take 1 tablet twice a day by oral route with meals.    [provider]  progesterone (PROMETRIUM) 100 MG capsule progesterone micronized 100 mg capsule 06/11/20   [provider]  propranolol (INDERAL) 10 MG tablet Take 10 mg by mouth daily.    [provider]  spironolactone (ALDACTONE) 25 MG tablet Take 25 mg by mouth daily.    [provider]  timolol (TIMOPTIC) 0.5 %  ophthalmic solution 1 drop 2 (two) times daily. 12/19/20   [provider]  tiZANidine (ZANAFLEX) 2 MG tablet Take by mouth. 12/31/20   [provider]  topiramate (TOPAMAX) 25 MG capsule Take 50 mg by mouth 2 (two) times daily.    [provider]  topiramate (TOPAMAX) 25 MG tablet topiramate 25 mg tablet 06/30/20   [provider]  Vitamin D, Ergocalciferol, (DRISDOL) 50000 UNITS CAPS Take 50,000 Units by mouth every 7 (seven) days. Friday    [provider]    Family History Family History  Problem Relation Age of Onset   Hypertension Mother    Thyroid disease Mother    Depression Mother    Stroke Maternal Grandmother    Other Maternal Grandmother    Anesthesia problems Neg Hx    Hypotension Neg Hx    Malignant hyperthermia Neg Hx    Pseudochol deficiency Neg Hx    Breast cancer Neg Hx     Social History Social History   Tobacco Use   Smoking status: Never   Smokeless tobacco: Never   Vaping Use   Vaping Use: Never used  Substance Use Topics   Alcohol use: No   Drug use: No     Allergies   Sulfa antibiotics, Sulfasalazine, Azathioprine, Demerol [meperidine hcl], Gluten, Gluten meal, and Morphine and related   Review of Systems Review of Systems  Constitutional:  Negative for activity change, appetite change and fever.  HENT:  Positive for congestion, postnasal drip and sore throat. Negative for ear pain and rhinorrhea.   Respiratory:  Positive for cough. Negative for shortness of breath and wheezing.   Gastrointestinal:  Negative for diarrhea, nausea and vomiting.  Musculoskeletal:  Positive for arthralgias and myalgias.  Neurological:  Positive for headaches.  Hematological: Negative.   Psychiatric/Behavioral: Negative.      Physical Exam Triage Vital Signs ED Triage Vitals  Enc Vitals Group     BP 08/08/21 1159 93/66     Pulse Rate 08/08/21 1159 68     Resp 08/08/21 1159 14     Temp 08/08/21 1159 98.6 F (37 C)     Temp Source 08/08/21 1159 Oral     SpO2 08/08/21 1159 97 %     Weight 08/08/21 1157 238 lb 1.6 oz (108 kg)     Height --      Head Circumference --      Peak Flow --      Pain Score 08/08/21 1157 2     Pain Loc --      Pain Edu? --      Excl. in [place]? --    No data found.  Updated Vital Signs BP 93/66 (BP Location: Left Arm)    Pulse 68    Temp 98.6 F (37 C) (Oral)    Resp 14    Wt 238 lb 1.6 oz (108 kg)    LMP 08/27/2011    SpO2 97%    BMI 39.62 kg/m   Visual Acuity Right Eye Distance:   Left Eye Distance:   Bilateral Distance:    Right Eye Near:   Left Eye Near:    Bilateral Near:     Physical Exam Vitals and nursing note reviewed.  Constitutional:      General: She is not in acute distress.    Appearance: Normal appearance. She is not ill-appearing.  HENT:     Head: Normocephalic and atraumatic.     Right Ear: Tympanic membrane, ear canal  and external ear normal. There is no impacted cerumen.     Left Ear:  Tympanic membrane, ear canal and external ear normal. There is no impacted cerumen.     Nose: Congestion and rhinorrhea present.     Mouth/Throat:     Mouth: Mucous membranes are moist.     Pharynx: Oropharynx is clear. Posterior oropharyngeal erythema present.  Cardiovascular:     Rate and Rhythm: Normal rate and regular rhythm.     Pulses: Normal pulses.     Heart sounds: Normal heart sounds. No murmur heard.   No friction rub. No gallop.  Pulmonary:     Effort: Pulmonary effort is normal.     Breath sounds: Normal breath sounds. No wheezing, rhonchi or rales.  Musculoskeletal:     Cervical back: Normal range of motion and neck supple.  Lymphadenopathy:     Cervical: No cervical adenopathy.  Skin:    General: Skin is warm and dry.     Capillary Refill: Capillary refill takes less than 2 seconds.     Findings: No erythema or rash.  Neurological:     General: No focal deficit present.     Mental Status: She is alert and oriented to person, place, and time.  Psychiatric:        Mood and Affect: Mood normal.        Behavior: Behavior normal.        Thought Content: Thought content normal.        Judgment: Judgment normal.     UC Treatments / Results  Labs (all labs ordered are listed, but only abnormal results are displayed) Labs Reviewed - No data to display  EKG   Radiology No results found.  Procedures Procedures (including critical care time)  Medications Ordered in UC Medications - No data to display  Initial Impression / Assessment and Plan / UC Course  I have reviewed the triage vital signs and the nursing notes.  Pertinent labs & imaging results that were available during my care of the patient were reviewed by me and considered in my medical decision making (see chart for details).  Patient is a nontoxic-appearing 61 year old female here for evaluation of respiratory complaints in the setting of being exposed to [place] through her husband.  She is  requesting treatment given her close exposure.  Her physical exam includes pearly-gray tympanic membranes bilaterally with normal light reflex and clear external auditory canals.  Nasal mucosa is erythematous and edematous with clear discharge in both nares.  Oropharyngeal exam reveals mild posterior oropharyngeal erythema and clear postnasal drip.  No cervical lymphadenopathy appreciable exam.  Cardiopulmonary exam shows clear lung sounds in all fields.  We will treat patient for potential COVID with molnupiravir twice daily for 5 days.  [place] of Atrovent nasal spray and Tessalon Perles to help with cough and congestion.   Final Clinical Impressions(s) / UC Diagnoses   Final diagnoses:  Viral URI with cough     Discharge Instructions      You will have to quarantine for 5 days from the start of your symptoms.  After 5 days you can break quarantine if your symptoms have improved and you have not had a fever for 24 hours without taking Tylenol or ibuprofen.  Use over-the-counter Tylenol and ibuprofen as needed for body aches and fever.  Use the Atrovent nasal spray, 2 squirts in each nostril every 6 hours, as needed for runny nose and postnasal drip.  Use the [employer] every  8 hours during the day.  Take them with a small sip of water.  They may give you some numbness to the base of your tongue or a metallic taste in your mouth, this is normal.  Take the molnupiravir twice daily for 5 days for treatment of COVID 19.  Return for reevaluation or see your primary care provider for any new or worsening symptoms.   If you develop any increased shortness of breath-especially at rest, you are unable to speak in full sentences, or is a late sign your lips are turning blue you need to go the ER for evaluation.      ED Prescriptions     Medication Sig Dispense Auth. Provider   molnupiravir EUA (LAGEVRIO) 200 mg CAPS capsule Take 4 capsules (800 mg total) by mouth 2 (two) times daily for 5  days. 40 capsule Margarette Canada, NP   ipratropium (ATROVENT) 0.06 % nasal spray Place 2 sprays into both nostrils 4 (four) times daily. 15 mL Margarette Canada, NP   benzonatate (TESSALON) 100 MG capsule Take 2 capsules (200 mg total) by mouth every 8 (eight) hours. 21 capsule Margarette Canada, NP      PDMP not reviewed this encounter.   Margarette Canada, NP 08/08/21 1311

## 2021-08-08 NOTE — Discharge Instructions (Signed)
You will have to quarantine for 5 days from the start of your symptoms.  After 5 days you can break quarantine if your symptoms have improved and you have not had a fever for 24 hours without taking Tylenol or ibuprofen.  Use over-the-counter Tylenol and ibuprofen as needed for body aches and fever.  Use the Atrovent nasal spray, 2 squirts in each nostril every 6 hours, as needed for runny nose and postnasal drip.  Use the Tessalon Perles every 8 hours during the day.  Take them with a small sip of water.  They may give you some numbness to the base of your tongue or a metallic taste in your mouth, this is normal.  Take the molnupiravir twice daily for 5 days for treatment of COVID 19.  Return for reevaluation or see your primary care provider for any new or worsening symptoms.   If you develop any increased shortness of breath-especially at rest, you are unable to speak in full sentences, or is a late sign your lips are turning blue you need to go the ER for evaluation.

## 2021-08-08 NOTE — ED Triage Notes (Signed)
Patient c/o headache, congestion, and cough that started this morning.  Patient denies fevers.  Patient states that her husband tested positive for COVID on Wed.

## 2021-10-22 ENCOUNTER — Ambulatory Visit: Payer: Medicare HMO

## 2021-10-26 ENCOUNTER — Ambulatory Visit: Payer: Medicare HMO | Admitting: Dermatology

## 2021-10-26 DIAGNOSIS — D2239 Melanocytic nevi of other parts of face: Secondary | ICD-10-CM | POA: Diagnosis not present

## 2021-10-26 DIAGNOSIS — L738 Other specified follicular disorders: Secondary | ICD-10-CM | POA: Diagnosis not present

## 2021-10-26 DIAGNOSIS — I8393 Asymptomatic varicose veins of bilateral lower extremities: Secondary | ICD-10-CM | POA: Diagnosis not present

## 2021-10-26 NOTE — Patient Instructions (Signed)

## 2021-10-26 NOTE — Progress Notes (Signed)
? ?  Follow-Up Visit ?  ?Subjective  ?Jody Turner is a 61 y.o. female who presents for the following: Other (Spots on face - she just wants them checked today. If they need to be treated, she will come back (she is hosting a big party this weekend)) and Rash (Discoloration of lower legs. Was treated for "matted veins" with a vein dr but it did not help.).She gets regular yearly sclerotherapy treatments with her vascular surgeon.  She has had multiple vein treatments including vein stripping and IVLT in past. ?The patient has spots, moles and lesions to be evaluated, some may be new or changing and the patient has concerns that these could be cancer. ? ?The following portions of the chart were reviewed this encounter and updated as appropriate:  ? Tobacco  Allergies  Meds  Problems  Med Hx  Surg Hx  Fam Hx   ?  ?Review of Systems:  No other skin or systemic complaints except as noted in HPI or Assessment and Plan. ? ?Objective  ?Well appearing patient in no apparent distress; mood and affect are within normal limits. ? ?A focused examination was performed including face, legs. Relevant physical exam findings are noted in the Assessment and Plan. ? ?Left prox nasolabial ?0.3 cm flesh colored papule ? ? ? ? ? ? ? ?Right cheek ?Yellow papule ? ? ? ?Assessment & Plan  ?Fibrous papule of nose ?Left prox nasal ala ?Benign-appearing.  Observation.  Call clinic for new or changing lesions.  Recommend daily use of broad spectrum spf 30+ sunscreen to sun-exposed areas.  ?May consider shave removal in the future ? ?Sebaceous hyperplasia ?Right cheek ?Benign-appearing.  Observation.  Call clinic for new or changing lesions.  Recommend daily use of broad spectrum spf 30+ sunscreen to sun-exposed areas.  ?Discussed ED treatment. ? ?Spider veins of both lower extremities ?Legs ?Discussed laser treatments.  ?Discussed the risk of treatment not working well. Advised patient this can be caused by hydrostatic pressure of the  deeper varicose veins.She has had vein stripping and IVLT in the past ? ?Discussed the treatment option of BBL/laser.  Typically we recommend 1-3 treatment sessions about 5-8 weeks apart for best results.  The patient's condition may require "maintenance treatments" in the future.  The fee for BBL / laser treatments is $350 per treatment session for the whole face.  A fee can be quoted for other parts of the body. ?Insurance typically does not pay for BBL/laser treatments and therefore the fee is an out-of-pocket cost.. ? ?Return for Shave removal of fibrous papule. ? ?I, Ashok Cordia, CMA, am acting as scribe for Sarina Ser, MD . ?Documentation: I have reviewed the above documentation for accuracy and completeness, and I agree with the above. ? ?Sarina Ser, MD ? ?

## 2021-10-27 ENCOUNTER — Encounter: Payer: Self-pay | Admitting: Dermatology

## 2021-11-05 ENCOUNTER — Ambulatory Visit
Admission: RE | Admit: 2021-11-05 | Discharge: 2021-11-05 | Disposition: A | Discharge status: Home / Self Care | Payer: Medicare HMO | Source: Ambulatory Visit | Attending: Obstetrics and Gynecology | Admitting: Obstetrics and Gynecology

## 2021-11-05 DIAGNOSIS — Z1239 Encounter for other screening for malignant neoplasm of breast: Secondary | ICD-10-CM

## 2021-11-05 IMAGING — MG MM DIGITAL SCREENING BILAT W/ TOMO AND CAD
6 of 10 series · 6 of 30 positions shown · non-contrast
Comparison: Previous exam(s).

CLINICAL DATA: Screening.

EXAM:
DIGITAL SCREENING BILATERAL MAMMOGRAM WITH TOMOSYNTHESIS AND CAD
TECHNIQUE: Bilateral screening digital craniocaudal and mediolateral oblique
mammograms were obtained. Bilateral screening digital breast
tomosynthesis was performed. The images were evaluated with
computer-aided detection.

[L CC synth-2D (1 of 2)]
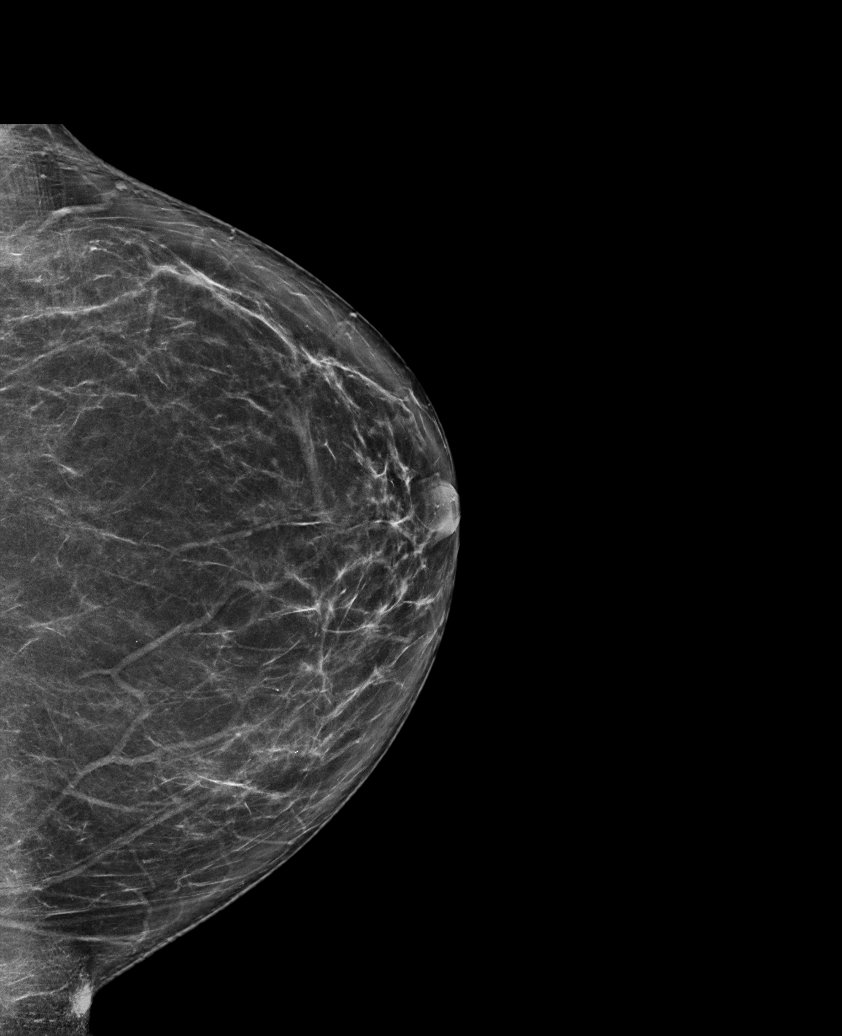

[R MLO synth-2D]
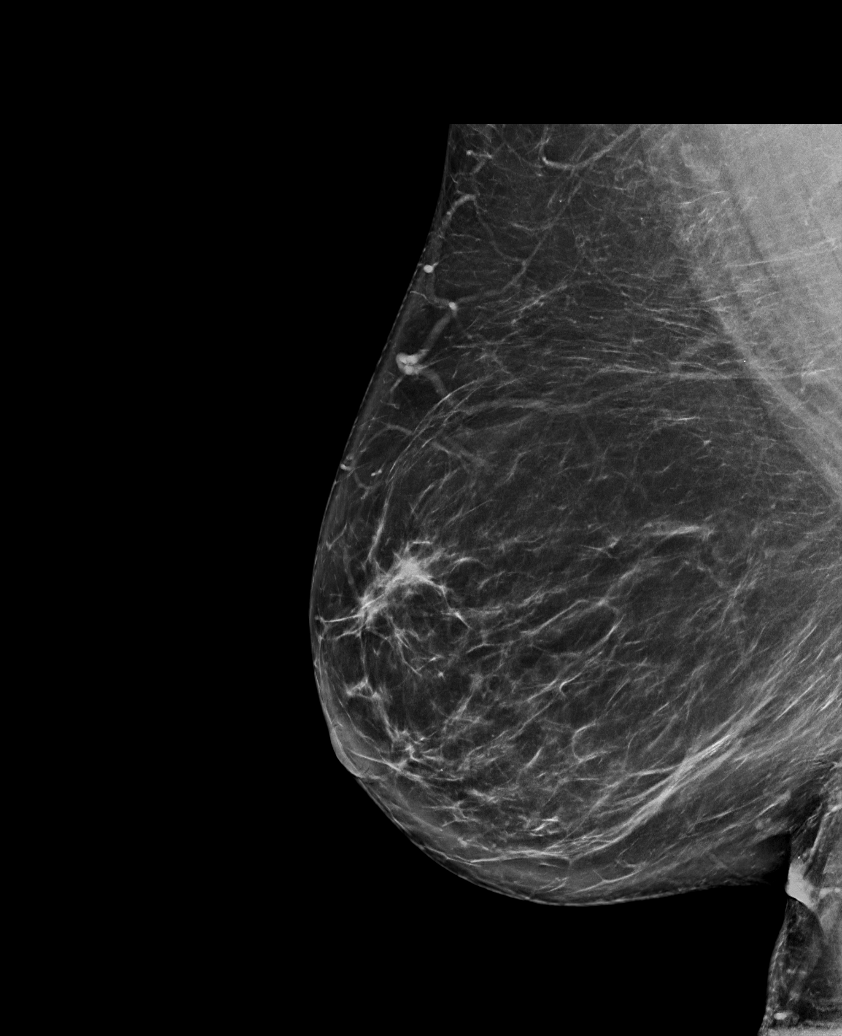

[R CC synth-2D]
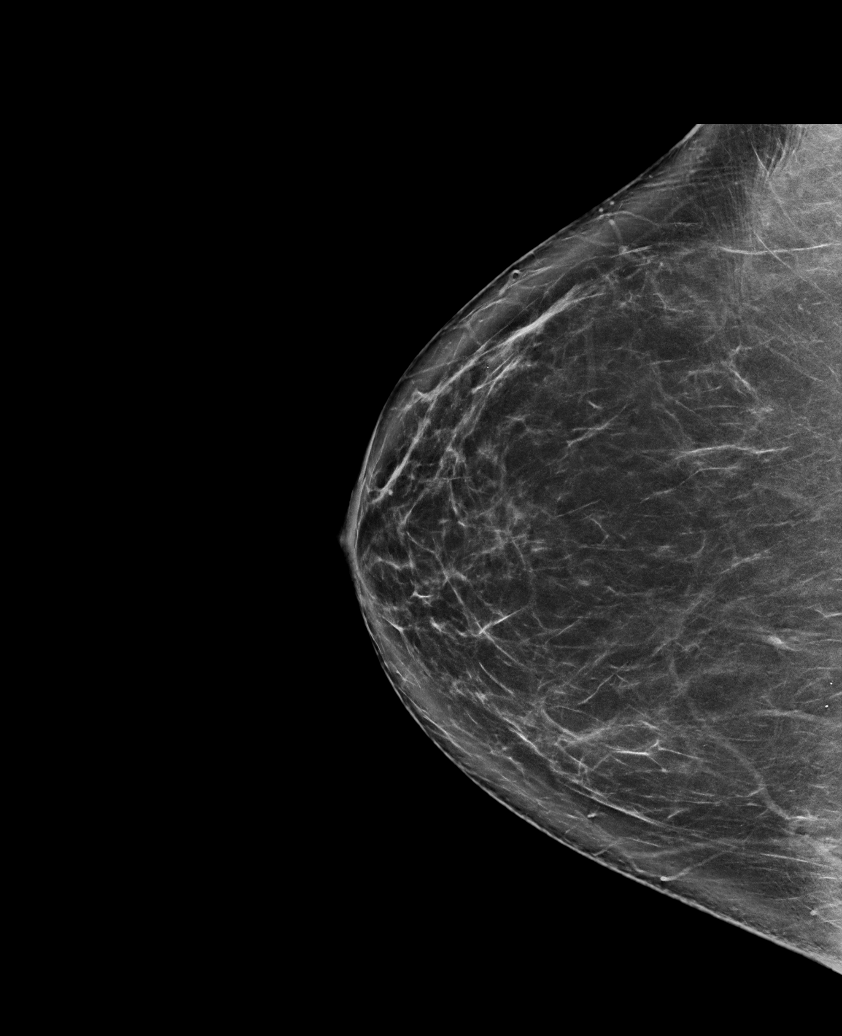

[L MLO synth-2D]
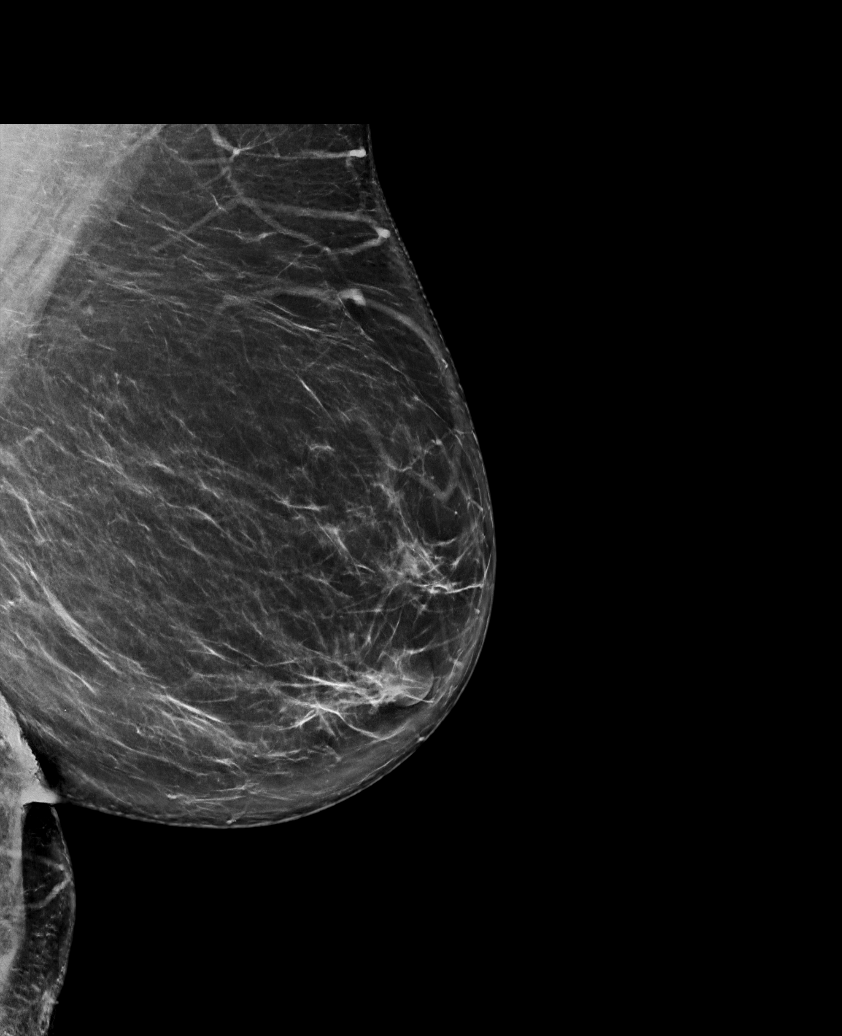

[L CC synth-2D (2 of 2)]
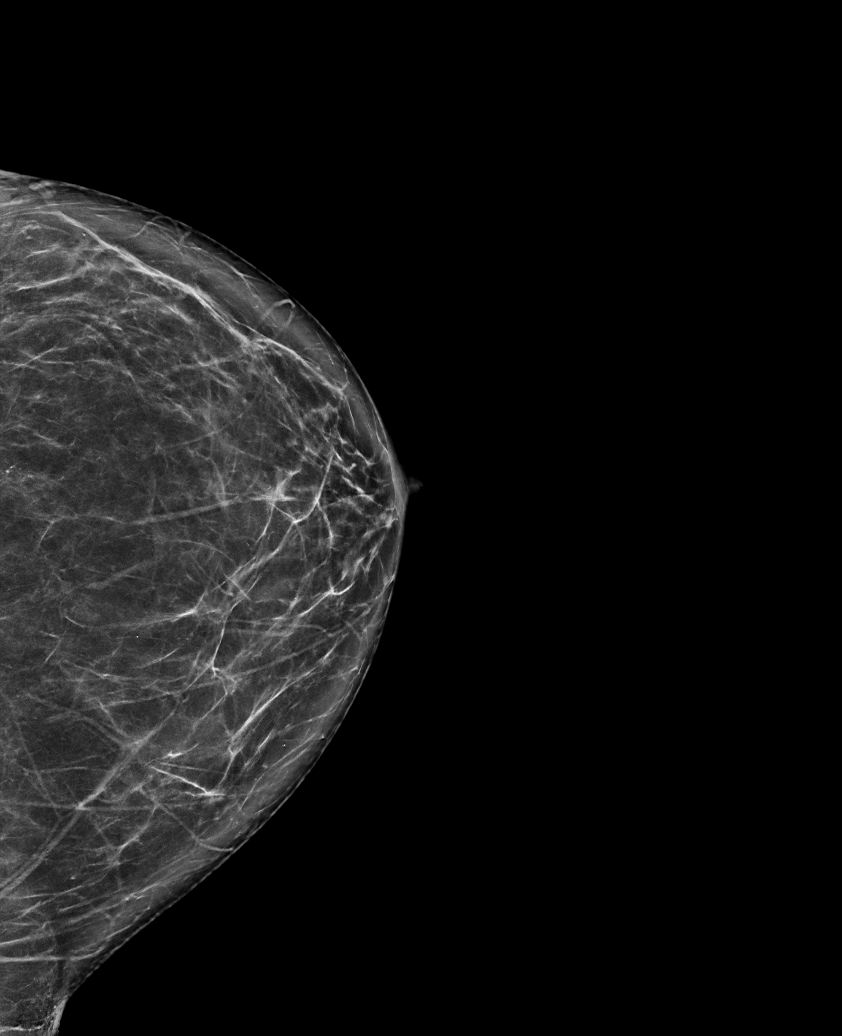

[R CC tomo · tomo slice 47/92.0]
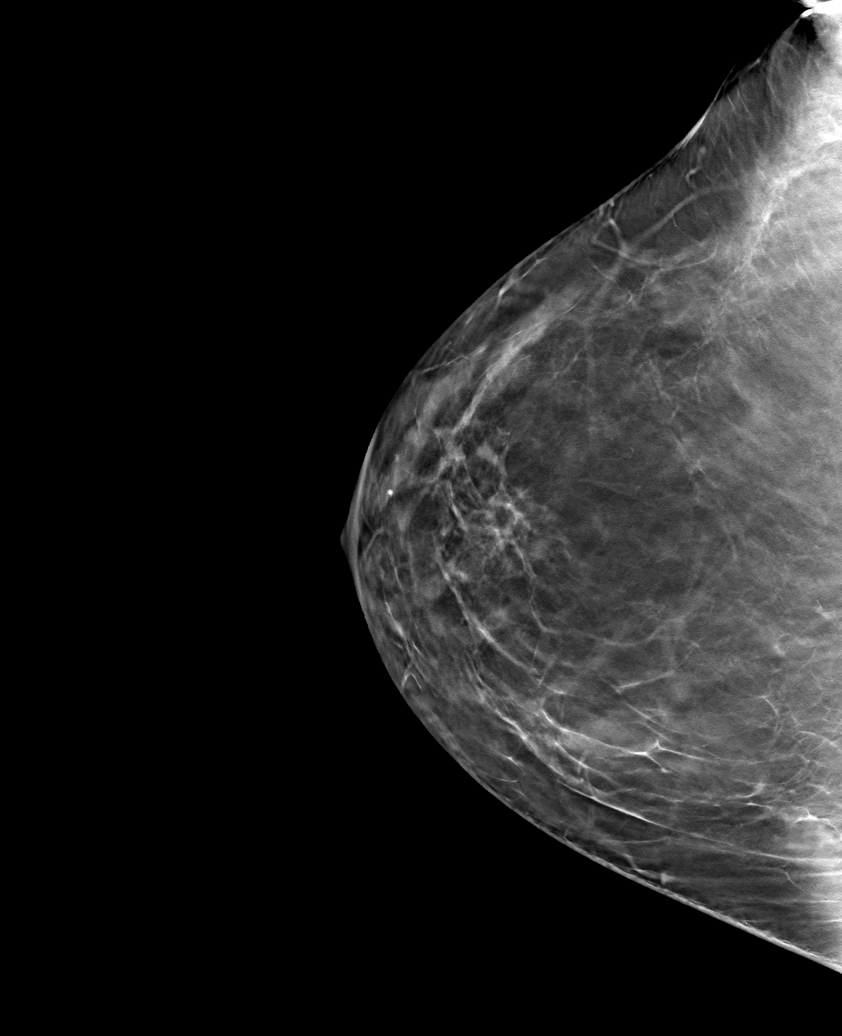

[6 of 30 positions shown; findings below may reference images not displayed]

ACR Breast Density Category b: There are scattered areas of
fibroglandular density.
FINDINGS: There are no findings suspicious for malignancy.
IMPRESSION: No mammographic evidence of malignancy. A result letter of this
screening mammogram will be mailed directly to the patient.

RECOMMENDATION:
Screening mammogram in one year. (Code:[BY])

BI-RADS CATEGORY  1: Negative.

## 2021-12-01 ENCOUNTER — Ambulatory Visit: Payer: Medicare HMO | Admitting: Dermatology

## 2021-12-03 ENCOUNTER — Ambulatory Visit: Payer: Medicare HMO | Admitting: Dermatology

## 2021-12-07 ENCOUNTER — Ambulatory Visit: Payer: Medicare HMO | Admitting: Dermatology

## 2021-12-07 DIAGNOSIS — C44311 Basal cell carcinoma of skin of nose: Secondary | ICD-10-CM | POA: Diagnosis not present

## 2021-12-07 DIAGNOSIS — C4491 Basal cell carcinoma of skin, unspecified: Secondary | ICD-10-CM

## 2021-12-07 DIAGNOSIS — D485 Neoplasm of uncertain behavior of skin: Secondary | ICD-10-CM

## 2021-12-07 HISTORY — DX: Basal cell carcinoma of skin, unspecified: C44.91

## 2021-12-07 NOTE — Patient Instructions (Addendum)

## 2021-12-07 NOTE — Progress Notes (Signed)
   Follow-Up Visit   Subjective  Jody Turner is a 61 y.o. female who presents for the following: Skin lesion (Previously identified as a longstanding, nonchanging probable fibrous papule of the L prox nasolabial - patient would like lesion removed today.).  The following portions of the chart were reviewed this encounter and updated as appropriate:   Tobacco  Allergies  Meds  Problems  Med Hx  Surg Hx  Fam Hx     Review of Systems:  No other skin or systemic complaints except as noted in HPI or Assessment and Plan.  Objective  Well appearing patient in no apparent distress; mood and affect are within normal limits.  A focused examination was performed including the face. Relevant physical exam findings are noted in the Assessment and Plan.  L prox nasolabial 0.3 cm flesh colored papule.   Assessment & Plan  Neoplasm of uncertain behavior of skin L prox nasolabial  Epidermal / dermal shaving  Lesion diameter (cm):  0.3 Informed consent: discussed and consent obtained   Timeout: patient name, date of birth, surgical site, and procedure verified   Procedure prep:  Patient was prepped and draped in usual sterile fashion Prep type:  Isopropyl alcohol Anesthesia: the lesion was anesthetized in a standard fashion   Anesthetic:  1% lidocaine w/ epinephrine 1-100,000 buffered w/ 8.4% NaHCO3 Instrument used: #15 blade   Hemostasis achieved with: pressure, aluminum chloride and electrodesiccation   Outcome: patient tolerated procedure well   Post-procedure details: sterile dressing applied and wound care instructions given   Dressing type: bandage and petrolatum    Specimen 1 - Surgical pathology Differential Diagnosis: D48.5 r/o fibrous papule vs other  Check Margins: No   Return in about 1 year (around 12/08/2022) for TBSE.  Luther Redo, CMA, am acting as scribe for Sarina Ser, MD . Documentation: I have reviewed the above documentation for accuracy and  completeness, and I agree with the above.  Sarina Ser, MD

## 2021-12-09 ENCOUNTER — Telehealth: Payer: Self-pay

## 2021-12-09 NOTE — Telephone Encounter (Signed)
-----   Message from Ralene Bathe, MD sent at 12/08/2021  4:53 PM EDT ----- ?Diagnosis ?Skin , left prox nasolabial ?BASAL CELL CARCINOMA, NODULAR PATTERN, BASE INVOLVED ? ?Cancer - [facility] ?Schedule for treatment [facility]) ?Small spot ?

## 2021-12-09 NOTE — Telephone Encounter (Signed)
Patient informed of pathology results and appointment scheduled.  °

## 2021-12-18 ENCOUNTER — Encounter: Payer: Self-pay | Admitting: Dermatology

## 2022-01-12 ENCOUNTER — Ambulatory Visit (INDEPENDENT_AMBULATORY_CARE_PROVIDER_SITE_OTHER): Payer: Medicare HMO

## 2022-01-12 ENCOUNTER — Ambulatory Visit: Payer: Medicare HMO | Admitting: Podiatry

## 2022-01-12 DIAGNOSIS — M722 Plantar fascial fibromatosis: Secondary | ICD-10-CM

## 2022-01-12 MED ORDER — METHYLPREDNISOLONE 4 MG PO TBPK: ORAL_TABLET | ORAL | 0 refills | Status: DC

## 2022-01-12 NOTE — Patient Instructions (Signed)

## 2022-02-02 ENCOUNTER — Ambulatory Visit: Payer: Medicare HMO | Admitting: Dermatology

## 2022-02-02 ENCOUNTER — Encounter: Payer: Self-pay | Admitting: Dermatology

## 2022-02-02 DIAGNOSIS — L578 Other skin changes due to chronic exposure to nonionizing radiation: Secondary | ICD-10-CM

## 2022-02-02 DIAGNOSIS — C44319 Basal cell carcinoma of skin of other parts of face: Secondary | ICD-10-CM | POA: Diagnosis not present

## 2022-02-02 DIAGNOSIS — Z85828 Personal history of other malignant neoplasm of skin: Secondary | ICD-10-CM

## 2022-02-02 NOTE — Patient Instructions (Signed)
Electrodesiccation and Curettage ("Scrape and Burn") Wound Care Instructions  Leave the original bandage on for 24 hours if possible.  If the bandage becomes soaked or soiled before that time, it is OK to remove it and examine the wound.  A small amount of post-operative bleeding is normal.  If excessive bleeding occurs, remove the bandage, place gauze over the site and apply continuous pressure (no peeking) over the area for 30 minutes. If this does not work, please call our clinic as soon as possible or page your doctor if it is after hours.   Once a day, cleanse the wound with soap and water. It is fine to shower. If a thick crust develops you may use a Q-tip dipped into dilute hydrogen peroxide (mix 1:1 with water) to dissolve it.  Hydrogen peroxide can slow the healing process, so use it only as needed.    After washing, apply petroleum jelly (Vaseline) or an antibiotic ointment if your doctor prescribed one for you, followed by a bandage.    For best healing, the wound should be covered with a layer of ointment at all times. If you are not able to keep the area covered with a bandage to hold the ointment in place, this may mean re-applying the ointment several times a day.  Continue this wound care until the wound has healed and is no longer open. It may take several weeks for the wound to heal and close.  Itching and mild discomfort is normal during the healing process.  If you have any discomfort, you can take Tylenol (acetaminophen) or ibuprofen as directed on the bottle. (Please do not take these if you have an allergy to them or cannot take them for another reason).  Some redness, tenderness and white or yellow material in the wound is normal healing.  If the area becomes very sore and red, or develops a thick yellow-green material (pus), it may be infected; please notify us.    Wound healing continues for up to one year following surgery. It is not unusual to experience pain in the scar  from time to time during the interval.  If the pain becomes severe or the scar thickens, you should notify the office.    A slight amount of redness in a scar is expected for the first six months.  After six months, the redness will fade and the scar will soften and fade.  The color difference becomes less noticeable with time.  If there are any problems, return for a post-op surgery check at your earliest convenience.  To improve the appearance of the scar, you can use silicone scar gel, cream, or sheets (such as Mederma or Serica) every night for up to one year. These are available over the counter (without a prescription).  Please call our office at [phone] for any questions or concerns.     Due to recent changes in healthcare laws, you may see results of your pathology and/or laboratory studies on MyChart before the doctors have had a chance to review them. We understand that in some cases there may be results that are confusing or concerning to you. Please understand that not all results are received at the same time and often the doctors may need to interpret multiple results in order to provide you with the best plan of care or course of treatment. Therefore, we ask that you please give us 2 business days to thoroughly review all your results before contacting the office for clarification. Should   we see a critical lab result, you will be contacted sooner.   If You Need Anything After Your Visit  If you have any questions or concerns for your doctor, please call our main line at [phone] and press option 4 to reach your doctor's medical assistant. If no one answers, please leave a voicemail as directed and we will return your call as soon as possible. Messages left after 4 pm will be answered the following business day.   You may also send us a message via MyChart. We typically respond to MyChart messages within 1-2 business days.  For prescription refills, please ask your  pharmacy to contact our office. Our fax number is [phone].  If you have an urgent issue when the clinic is closed that cannot wait until the next business day, you can page your doctor at the number below.    Please note that while we do our best to be available for urgent issues outside of office hours, we are not available 24/7.   If you have an urgent issue and are unable to reach us, you may choose to seek medical care at your doctor's office, retail clinic, urgent care center, or emergency room.  If you have a medical emergency, please immediately call 911 or go to the emergency department.  Pager Numbers  - Dr. Kowalski: [phone]  - Dr. Moye: [phone]  - Dr. Stewart: [phone]  In the event of inclement weather, please call our main line at [phone] for an update on the status of any delays or closures.  Dermatology Medication Tips: Please keep the boxes that topical medications come in in order to help keep track of the instructions about where and how to use these. Pharmacies typically print the medication instructions only on the boxes and not directly on the medication tubes.   If your medication is too expensive, please contact our office at [phone] option 4 or send us a message through MyChart.   We are unable to tell what your co-pay for medications will be in advance as this is different depending on your insurance coverage. However, we may be able to find a substitute medication at lower cost or fill out paperwork to get insurance to cover a needed medication.   If a prior authorization is required to get your medication covered by your insurance company, please allow us 1-2 business days to complete this process.  Drug prices often vary depending on where the prescription is filled and some pharmacies may offer cheaper prices.  The website www.goodrx.com contains coupons for medications through different pharmacies. The prices here do not  account for what the cost may be with help from insurance (it may be cheaper with your insurance), but the website can give you the price if you did not use any insurance.  - You can print the associated coupon and take it with your prescription to the pharmacy.  - You may also stop by our office during regular business hours and pick up a GoodRx coupon card.  - If you need your prescription sent electronically to a different pharmacy, notify our office through [facility] MyChart or by phone at [phone] option 4.     Si Usted Necesita Algo Despus de Su Visita  Tambin puede enviarnos un mensaje a travs de MyChart. Por lo general respondemos a los mensajes de MyChart en el transcurso de 1 a 2 das hbiles.  Para renovar recetas, por favor pida a su farmacia que se ponga en   contacto con nuestra oficina. Nuestro nmero de fax es el [phone].  Si tiene un asunto urgente cuando la clnica est cerrada y que no puede esperar hasta el siguiente da hbil, puede llamar/localizar a su doctor(a) al nmero que aparece a continuacin.   Por favor, tenga en cuenta que aunque hacemos todo lo posible para estar disponibles para asuntos urgentes fuera del horario de oficina, no estamos disponibles las 24 horas del da, los 7 das de la semana.   Si tiene un problema urgente y no puede comunicarse con nosotros, puede optar por buscar atencin mdica  en el consultorio de su doctor(a), en una clnica privada, en un centro de atencin urgente o en una sala de emergencias.  Si tiene una emergencia mdica, por favor llame inmediatamente al 911 o vaya a la sala de emergencias.  Nmeros de bper  - Dr. Kowalski: [phone]  - Dra. Moye: [phone]  - Dra. Stewart: [phone]  En caso de inclemencias del tiempo, por favor llame a nuestra lnea principal al [phone] para una actualizacin sobre el estado de cualquier retraso o cierre.  Consejos para la medicacin en dermatologa: Por  favor, guarde las cajas en las que vienen los medicamentos de uso tpico para ayudarle a seguir las instrucciones sobre dnde y cmo usarlos. Las farmacias generalmente imprimen las instrucciones del medicamento slo en las cajas y no directamente en los tubos del medicamento.   Si su medicamento es muy caro, por favor, pngase en contacto con nuestra oficina llamando al [phone] y presione la opcin 4 o envenos un mensaje a travs de MyChart.   No podemos decirle cul ser su copago por los medicamentos por adelantado ya que esto es diferente dependiendo de la cobertura de su seguro. Sin embargo, es posible que podamos encontrar un medicamento sustituto a menor costo o llenar un formulario para que el seguro cubra el medicamento que se considera necesario.   Si se requiere una autorizacin previa para que su compaa de seguros cubra su medicamento, por favor permtanos de 1 a 2 das hbiles para completar este proceso.  Los precios de los medicamentos varan con frecuencia dependiendo del lugar de dnde se surte la receta y alguna farmacias pueden ofrecer precios ms baratos.  El sitio web www.goodrx.com tiene cupones para medicamentos de diferentes farmacias. Los precios aqu no tienen en cuenta lo que podra costar con la ayuda del seguro (puede ser ms barato con su seguro), pero el sitio web puede darle el precio si no utiliz ningn seguro.  - Puede imprimir el cupn correspondiente y llevarlo con su receta a la farmacia.  - Tambin puede pasar por nuestra oficina durante el horario de atencin regular y recoger una tarjeta de cupones de GoodRx.  - Si necesita que su receta se enve electrnicamente a una farmacia diferente, informe a nuestra oficina a travs de MyChart de [facility] o por telfono llamando al [phone] y presione la opcin 4.  

## 2022-02-02 NOTE — Progress Notes (Signed)
   Follow-Up Visit   Subjective  Jody Turner is a 61 y.o. female who presents for the following: Bx proven BCC (L prox nasal ala - patient is here today for [facility] ).  The following portions of the chart were reviewed this encounter and updated as appropriate:   Tobacco  Allergies  Meds  Problems  Med Hx  Surg Hx  Fam Hx     Review of Systems:  No other skin or systemic complaints except as noted in HPI or Assessment and Plan.  Objective  Well appearing patient in no apparent distress; mood and affect are within normal limits.  A focused examination was performed including the face. Relevant physical exam findings are noted in the Assessment and Plan.  L prox nasal ala Pink biopsy site.   Assessment & Plan  Basal cell carcinoma (BCC) of skin of other part of face L prox nasal ala  Destruction of lesion Complexity: extensive   Destruction method: electrodesiccation and curettage   Informed consent: discussed and consent obtained   Timeout:  patient name, date of birth, surgical site, and procedure verified Procedure prep:  Patient was prepped and draped in usual sterile fashion Prep type:  Isopropyl alcohol Anesthesia: the lesion was anesthetized in a standard fashion   Anesthetic:  1% lidocaine w/ epinephrine 1-100,000 buffered w/ 8.4% NaHCO3 Curettage performed in three different directions: Yes   Electrodesiccation performed over the curetted area: Yes   Curettage cycles:  3 Lesion length (cm):  0.3 Lesion width (cm):  0.3 Margin per side (cm):  0.2 Final wound size (cm):  0.7 Hemostasis achieved with:  pressure, aluminum chloride and electrodesiccation Outcome: patient tolerated procedure well with no complications   Post-procedure details: sterile dressing applied and wound care instructions given   Dressing type: petrolatum    Actinic Damage - chronic, secondary to cumulative UV radiation exposure/sun exposure over time - diffuse scaly erythematous macules  with underlying dyspigmentation - Recommend daily broad spectrum sunscreen SPF 30+ to sun-exposed areas, reapply every 2 hours as needed.  - Recommend staying in the shade or wearing long sleeves, sun glasses (UVA+UVB protection) and wide brim hats (4-inch brim around the entire circumference of the hat). - Call for new or changing lesions.  History of Basal Cell Carcinoma of the Skin - R ant neck 2015 Dr. Evorn Gong  - No evidence of recurrence today - Recommend regular full body skin exams - Recommend daily broad spectrum sunscreen SPF 30+ to sun-exposed areas, reapply every 2 hours as needed.  - Call if any new or changing lesions are noted between office visits  Return in about 6 months (around 08/05/2022) for TBSE - Hx BCC.  Luther Redo, CMA, am acting as scribe for Sarina Ser, MD . Documentation: I have reviewed the above documentation for accuracy and completeness, and I agree with the above.  Sarina Ser, MD

## 2022-02-10 ENCOUNTER — Encounter: Payer: Self-pay | Admitting: Dermatology

## 2022-02-11 ENCOUNTER — Ambulatory Visit (INDEPENDENT_AMBULATORY_CARE_PROVIDER_SITE_OTHER): Payer: Medicare HMO

## 2022-02-11 ENCOUNTER — Ambulatory Visit: Payer: Medicare HMO | Admitting: Podiatry

## 2022-02-11 DIAGNOSIS — M722 Plantar fascial fibromatosis: Secondary | ICD-10-CM

## 2022-02-11 DIAGNOSIS — M25572 Pain in left ankle and joints of left foot: Secondary | ICD-10-CM

## 2022-02-11 DIAGNOSIS — M7752 Other enthesopathy of left foot: Secondary | ICD-10-CM

## 2022-02-11 MED ORDER — TRIAMCINOLONE ACETONIDE 10 MG/ML IJ SUSP
10.0000 mg | Freq: Once | INTRAMUSCULAR | Status: AC
  Administered 2022-02-11: 10 mg

## 2022-02-11 NOTE — Patient Instructions (Signed)
While at your visit today you received a steroid injection in your foot or ankle to help with your pain. Along with having the steroid medication there is some "numbing" medication in the shot that you received. Due to this you may notice some numbness to the area for the next couple of hours.   I would recommend limiting activity for the next few days to help the steroid injection take affect.    The actually benefit from the steroid injection may take up to 2-7 days to see a difference. You may actually experience a small (as in 10%) INCREASE in pain in the first 24 hours---that is common. It would be best if you can ice the area today and take anti-inflammatory medications (such as Ibuprofen, Motrin, or Aleve) if you are able to take these medications. If you were prescribed another medication to help with the pain go ahead and start that medication today    Things to watch out for that you should contact us or a health care provider urgently would include: 1. Unusual (as in more than 10%) increase in pain 2. New fever > 101.5 3. New swelling or redness of the injected area.  4. Streaking of red lines around the area injected.  If you have any questions or concerns about this, please give our office a call at [phone].   Walking Boot, Adult  A walking boot holds your foot or ankle in place after an injury or a medical procedure. This helps with healing and prevents further injury. It has a hard, rigid outer frame that limits movement and supports your foot and leg. The inner lining is a layer of padded material. Walking boots also have adjustable straps to secure them over the foot and leg. A walking boot may be prescribed if you can put weight (bear weight) on your injured foot. How much you can walk while wearing the boot will depend on the type and severity of your injury. How to put on a walking boot There are different types of walking boots. Each type has specific instructions about  how to wear it properly. Follow instructions from your health care provider, such as: Ask someone to help you put on the boot, if needed. Sit to put on your boot. Doing this is more comfortable and helps to prevent falls. Open up the boot fully. Place your foot into the boot so your heel rests against the back. Your toes should be supported by the base of the boot. They should not hang over the front edge. Adjust the straps so the boot fits securely but is not too tight. Do not bend the hard frame of the boot to get a good fit. How to walk with a walking boot How much you can walk will depend on your injury. Some tips for managing with a boot include: Do not try to walk without wearing the boot unless your health care provider approves. Use other assistive walking devices, including crutches or canes, as told by your health care provider. On your uninjured foot, wear a shoe with a heel that is close to the height of the walking boot. Be careful when walking on surfaces that are uneven or wet. How to reduce swelling while using a walking boot  Rest your injured foot or leg as much as possible. If directed, put ice on the injured area. To do this: Put ice in a plastic bag. Place a towel between your skin and the bag. Leave the ice   on for 20 minutes, 2-3 times a day. Remove the ice if your skin turns bright red. This is very important. If you cannot feel pain, heat, or cold, you have a greater risk of damage to the area. Keep your injured foot or leg raised (elevated) above the level of your heart for at least 2?3 hours each day or as told by your health care provider. If swelling gets worse, loosen the boot. Rest and elevate your foot and leg. How to care for your skin and foot while using a walking boot Wear a long sock to protect your foot and leg from rubbing inside the boot. Take off the boot one time each day to check the injured area. Check your foot, the surrounding skin, and your leg  to make sure there are no sores, rashes, swelling, or wounds. The skin should be a healthy color, not pale or blue. Try to notice if your walking pattern (gait) in the boot is fairly normal and that you are walking without a noticeable limp. Follow instructions from your health care provider about taking care of your incision or wound, if this applies. Clean and wash the injured area as told by your health care provider. Gently dry your foot and leg before putting the boot back on. Removing your walking boot Follow directions from your health care provider for removing the walking boot. Generally, it is okay to remove your walking boot: When you are resting or sleeping. To clean your foot and leg. How to keep the walking boot clean Do not put any part of the boot in a washing machine or dryer. Do not use chemical cleaning products. These could irritate your skin, especially if you have a wound or an incision. Do not soak the liner of the boot. Use a washcloth with mild soap and water to clean the frame and the liner of the boot by hand. Allow the boot to air-dry completely before you put it back on your foot. Follow these instructions at home: Activity Your activity will be restricted depending on the type and severity of your injury. Follow instructions from your health care provider. Also: Bathe and shower as told by your health care provider. Do not do any activities that could make your injury worse. Do not drive if your affected foot is the one that you use for driving. Contact a health care provider if: The boot is cracked or damaged. The boot does not fit properly. Your foot or leg hurts. You have a rash, sore, or open sore (ulcer) on your foot or leg. The skin on your foot or leg is pale. You have a wound or incision on the foot and it is getting worse. Your skin becomes painful, red, or irritated. Your swelling does not get better or it gets worse. Get help right away if: You  have numbness in your foot or leg. The skin on your foot or leg is cold, blue, or gray. Summary A walking boot holds your foot or ankle in place after an injury or a medical procedure. There are different types of walking boots. Follow instructions about how to correctly wear your boot. Ask someone to help you put on the boot, if needed. It is important to check your skin and foot every day. Call your health care provider if you notice a rash or sore on your foot or leg. This information is not intended to replace advice given to you by your health care provider. Make sure you   discuss any questions you have with your health care provider. Document Revised: 05/05/2020 Document Reviewed: 05/05/2020 Elsevier Patient Education  2023 [employer]  

## 2022-02-11 NOTE — Progress Notes (Signed)
Subjective:  Chief Complaint  Patient presents with   Plantar Fasciitis    Left foot plantar fasciitis, pt is having a flare up, pain rate is a 8 out of 10, pain is in the heel and arch TX medrol pack did help some, Ice, Night splint, pain meds      61 year old female presents the office today with the above complaints.  She states that the steroids were very helpful but since she is no longer on this the pains come back she points to the anterior medial aspect the ankle as well as the plantar fascia gets discomfort.  She is requesting steroid injection today.  No recent injuries.  She has been icing as well as wearing supportive shoes as well as a night splint.  Objective: AAO x3, NAD DP/PT pulses palpable bilaterally, CRT less than 3 seconds There is tenderness palpation on the plantar medial tubercle of the calcaneus at the insertion of plantar fascia on the left side there is also tenderness on the anteromedial aspect of the ankle joint.  Trace edema there is no erythema or warmth.  No crepitation or restriction of range of motion.  No area pinpoint tenderness.  No pain with calf compression, swelling, warmth, erythema  Assessment: Capsulitis left ankle, Plantar fasciitis  Plan: -All treatment options discussed with the patient including all alternatives, risks, complications.  -X-rays of the left ankle obtained.  No evidence of acute fracture.  Joint space maintained. -Inject ankle (medial approach)-see procedure note below -Inject plantar fascia-see procedure note below. -CAM boot for mobilization -Patient encouraged to call the office with any questions, concerns, change in symptoms.   Procedure: Injection Tendon/Ligament; ankle Discussed alternatives, risks, complications and verbal consent was obtained.  Location: Left plantar fascia at the glabrous junction; medial approach; anteromedial ankle joint Skin Prep: Alcohol. Injectate: 0.5cc 0.5% marcaine plain, 0.5 cc 2% lidocaine  plain and, 1 cc kenalog 10 into both the ankle and plantar fascia Disposition: Patient tolerated procedure well. Injection site dressed with a band-aid.  Post-injection care was discussed and return precautions discussed.   Return in about 3 weeks (around 03/04/2022).  Trula Slade DPM

## 2022-02-22 ENCOUNTER — Other Ambulatory Visit: Payer: Self-pay | Admitting: Podiatry

## 2022-02-22 DIAGNOSIS — M7752 Other enthesopathy of left foot: Secondary | ICD-10-CM

## 2022-03-04 ENCOUNTER — Ambulatory Visit: Payer: Medicare HMO | Admitting: Podiatry

## 2022-03-04 DIAGNOSIS — M722 Plantar fascial fibromatosis: Secondary | ICD-10-CM | POA: Diagnosis not present

## 2022-03-04 DIAGNOSIS — M7752 Other enthesopathy of left foot: Secondary | ICD-10-CM | POA: Diagnosis not present

## 2022-03-04 MED ORDER — TRIAMCINOLONE ACETONIDE 10 MG/ML IJ SUSP
10.0000 mg | Freq: Once | INTRAMUSCULAR | Status: AC
  Administered 2022-03-04: 10 mg

## 2022-03-04 MED ORDER — MELOXICAM 7.5 MG PO TABS
ORAL_TABLET | ORAL | 0 refills | Status: DC
Start: 2022-03-04 — End: 2022-12-08

## 2022-03-04 NOTE — Progress Notes (Signed)
Subjective:  Chief Complaint  Patient presents with   Plantar Fasciitis    Rm 11 3 week follow up PF. Pt there is some improvement but still has some pain especially without applying a ace wrap.     61 year old female with the above complaints.  She states today this started when she started helping to renovate a house.  No injuries that she reports.  She feels a pulling sensation on the bottom of her foot.  She is injection was helpful but still having discomfort.  She is able to wear the boot at times however she is back if she is not able to tolerate this.  She does have an ankle brace at home.  She has been using Ace bandage which helps as well.  Objective: AAO x3, NAD DP/PT pulses palpable bilaterally, CRT less than 3 seconds There is tenderness to palpation on plantar medial tubercle of the calcaneus and insertion of plantar fascia left foot.  Mild discomfort noted along the anterior medial ankle joint.  No crepitation or restriction with ankle joint range of motion.  There is no significant edema there is no erythema.  Flexor, extensor tendons appear intact. No pain with calf compression, swelling, warmth, erythema  Assessment: 61 year old female with plantar fasciitis, capsulitis  Plan: -All treatment options discussed with the patient including all alternatives, risks, complications.  -She was proceed with repeat steroid injection.  See procedure notes below.  Recommend immobilization given the injections.  She is not able to tolerate the cam boot much but when she is not able to wear this recommended the ankle brace.  Continue ice, elevation.  If no improvement consider MRI. -Patient encouraged to call the office with any questions, concerns, change in symptoms.   Procedure: Injection Tendon/Ligament Discussed alternatives, risks, complications and verbal consent was obtained.  Location: Left plantar fascia at the glabrous junction; medial approach. Skin Prep:  Alcohol. Injectate: 0.5cc 0.5% marcaine plain, 0.5 cc 2% lidocaine plain and, 1 cc kenalog 10. Disposition: Patient tolerated procedure well. Injection site dressed with a band-aid.  Post-injection care was discussed and return precautions discussed.   Procedure: Injection intermediate joint Discussed alternatives, risks, complications and verbal consent was obtained.  Location: Left anteromedial ankle joint Skin Prep: Betadine. Injectate: 0.5cc 0.5% marcaine plain, 0.5 cc 2% lidocaine plain and, 1 cc kenalog 10. Disposition: Patient tolerated procedure well. Injection site dressed with a band-aid.  Post-injection care was discussed and return precautions discussed.    Trula Slade DPM

## 2022-04-02 ENCOUNTER — Ambulatory Visit: Payer: Medicare HMO | Admitting: Podiatry

## 2022-05-10 ENCOUNTER — Ambulatory Visit (INDEPENDENT_AMBULATORY_CARE_PROVIDER_SITE_OTHER): Payer: Medicare HMO | Admitting: Podiatry

## 2022-05-10 DIAGNOSIS — M722 Plantar fascial fibromatosis: Secondary | ICD-10-CM | POA: Diagnosis not present

## 2022-05-10 DIAGNOSIS — M792 Neuralgia and neuritis, unspecified: Secondary | ICD-10-CM

## 2022-05-10 DIAGNOSIS — M7752 Other enthesopathy of left foot: Secondary | ICD-10-CM

## 2022-05-10 MED ORDER — GABAPENTIN 100 MG PO CAPS: 100.0000 mg | ORAL_CAPSULE | Freq: Every day | ORAL | 3 refills | Status: AC

## 2022-05-10 NOTE — Patient Instructions (Signed)
Gabapentin Capsules or Tablets What is this medication? GABAPENTIN (GA ba pen tin) treats nerve pain. It may also be used to prevent and control seizures in people with epilepsy. It works by calming overactive nerves in your body. This medicine may be used for other purposes; ask your health care provider or pharmacist if you have questions. COMMON BRAND NAME(S): Active-PAC with Gabapentin, Gabarone, Gralise, Neurontin What should I tell my care team before I take this medication? They need to know if you have any of these conditions: Alcohol or substance use disorder Kidney disease Lung or breathing disease Suicidal thoughts, plans, or attempt; a previous suicide attempt by you or a family member An unusual or allergic reaction to gabapentin, other medications, foods, dyes, or preservatives Pregnant or trying to get pregnant Breast-feeding How should I use this medication? Take this medication by mouth with a glass of water. Follow the directions on the prescription label. You can take it with or without food. If it upsets your stomach, take it with food. Take your medication at regular intervals. Do not take it more often than directed. Do not stop taking except on your care team's advice. If you are directed to break the 600 or 800 mg tablets in half as part of your dose, the extra half tablet should be used for the next dose. If you have not used the extra half tablet within 28 days, it should be thrown away. A special MedGuide will be given to you by the pharmacist with each prescription and refill. Be sure to read this information carefully each time. Talk to your care team about the use of this medication in children. While this medication may be prescribed for children as young as 3 years for selected conditions, precautions do apply. Overdosage: If you think you have taken too much of this medicine contact a poison control center or emergency room at once. NOTE: This medicine is only for  you. Do not share this medicine with others. What if I miss a dose? If you miss a dose, take it as soon as you can. If it is almost time for your next dose, take only that dose. Do not take double or extra doses. What may interact with this medication? Alcohol Antihistamines for allergy, cough, and cold Certain medications for anxiety or sleep Certain medications for depression like amitriptyline, fluoxetine, sertraline Certain medications for seizures like phenobarbital, primidone Certain medications for stomach problems General anesthetics like halothane, isoflurane, methoxyflurane, propofol Local anesthetics like lidocaine, pramoxine, tetracaine Medications that relax muscles for surgery Opioid medications for pain Phenothiazines like chlorpromazine, mesoridazine, prochlorperazine, thioridazine This list may not describe all possible interactions. Give your health care provider a list of all the medicines, herbs, non-prescription drugs, or dietary supplements you use. Also tell them if you smoke, drink alcohol, or use illegal drugs. Some items may interact with your medicine. What should I watch for while using this medication? Visit your care team for regular checks on your progress. You may want to keep a record at home of how you feel your condition is responding to treatment. You may want to share this information with your care team at each visit. You should contact your care team if your seizures get worse or if you have any new types of seizures. Do not stop taking this medication or any of your seizure medications unless instructed by your care team. Stopping your medication suddenly can increase your seizures or their severity. This medication may cause serious skin   reactions. They can happen weeks to months after starting the medication. Contact your care team right away if you notice fevers or flu-like symptoms with a rash. The rash may be red or purple and then turn into blisters or  peeling of the skin. Or, you might notice a red rash with swelling of the face, lips or lymph nodes in your neck or under your arms. Wear a medical identification bracelet or chain if you are taking this medication for seizures. Carry a card that lists all your medications. This medication may affect your coordination, reaction time, or judgment. Do not drive or operate machinery until you know how this medication affects you. Sit up or stand slowly to reduce the risk of dizzy or fainting spells. Drinking alcohol with this medication can increase the risk of these side effects. Your mouth may get dry. Chewing sugarless gum or sucking hard candy, and drinking plenty of water may help. Watch for new or worsening thoughts of suicide or depression. This includes sudden changes in mood, behaviors, or thoughts. These changes can happen at any time but are more common in the beginning of treatment or after a change in dose. Call your care team right away if you experience these thoughts or worsening depression. If you become pregnant while using this medication, you may enroll in the [place] Antiepileptic Drug Pregnancy Registry by calling 1-888-233-2334. This registry collects information about the safety of antiepileptic medication use during pregnancy. What side effects may I notice from receiving this medication? Side effects that you should report to your care team as soon as possible: Allergic reactions or angioedema--skin rash, itching, hives, swelling of the face, eyes, lips, tongue, arms, or legs, trouble swallowing or breathing Rash, fever, and swollen lymph nodes Thoughts of suicide or self harm, worsening mood, feelings of depression Trouble breathing Unusual changes in mood or behavior in children after use such as difficulty concentrating, hostility, or restlessness Side effects that usually do not require medical attention (report to your care team if they continue or are  bothersome): Dizziness Drowsiness Nausea Swelling of ankles, feet, or hands Vomiting This list may not describe all possible side effects. Call your doctor for medical advice about side effects. You may report side effects to FDA at 1-800-FDA-1088. Where should I keep my medication? Keep out of reach of children and pets. Store at room temperature between 15 and 30 degrees C (59 and 86 degrees F). Get rid of any unused medication after the expiration date. This medication may cause accidental overdose and death if taken by other adults, children, or pets. To get rid of medications that are no longer needed or have expired: Take the medication to a medication take-back program. Check with your pharmacy or law enforcement to find a location. If you cannot return the medication, check the label or package insert to see if the medication should be thrown out in the garbage or flushed down the toilet. If you are not sure, ask your care team. If it is safe to put it in the trash, empty the medication out of the container. Mix the medication with cat litter, dirt, coffee grounds, or other unwanted substance. Seal the mixture in a bag or container. Put it in the trash. NOTE: This sheet is a summary. It may not cover all possible information. If you have questions about this medicine, talk to your doctor, pharmacist, or health care provider.  2023 Elsevier/Gold Standard (2020-07-15 00:00:00)  

## 2022-05-12 NOTE — Progress Notes (Signed)
Subjective: Chief Complaint  Patient presents with   capsulitis     Left foot ankle pain,medial top side of the ankle,  patient states she is doing worse than the last visit, having some swelling, Rate of pain 5 out of 10, TX: injection (seemed to help some ), Topmax, patient is using Ace bandage to help with the pain       61 year old female with the above complaints.  States that the injection was not very helpful.  She also was on prednisone for another issue and that did not help her foot either.  She still points on the anterior medial aspect the ankle but also describes burning sensation to her ankle going up her leg.  No recent injury or changes.    Objective: AAO x3, NAD DP/PT pulses palpable bilaterally, CRT less than 3 seconds There is tenderness to palpation on plantar medial tubercle of the calcaneus and insertion of plantar fascia left foot.  Also some tenderness to palpation on the anterior aspect ankle but this appears to be more nerve related.  No edema, erythema.  Flexor, extensor tendons appear to be intact.  MMT 5/5.  No Tinel sign. No pain with calf compression, swelling, warmth, erythema  Assessment: 61 year old female with plantar fasciitis, capsulitis; concern for nerve issue left leg   Plan: -All treatment options discussed with the patient including all alternatives, risks, complications. -We will treat this with steroid injection, oral steroids and this has not helped at all.  I do think she is got some plantar fascia is in continue stretching, icing as well as supportive shoes but also to refer her to neurology.  I also ordered an MRI of the left ankle. -Discussed gabapentin.  She will start a low-dose.  We will start only 100 mg at nighttime and titrate up.  Discussed side effects.  Trula Slade DPM

## 2022-05-26 ENCOUNTER — Ambulatory Visit
Admission: RE | Admit: 2022-05-26 | Discharge: 2022-05-26 | Disposition: A | Discharge status: Home / Self Care | Payer: Medicare HMO | Source: Ambulatory Visit | Attending: Podiatry | Admitting: Podiatry

## 2022-05-26 DIAGNOSIS — M7752 Other enthesopathy of left foot: Secondary | ICD-10-CM

## 2022-05-27 ENCOUNTER — Other Ambulatory Visit: Payer: Medicare HMO

## 2022-06-02 ENCOUNTER — Telehealth: Payer: Self-pay

## 2022-06-03 ENCOUNTER — Other Ambulatory Visit: Payer: Self-pay

## 2022-06-03 ENCOUNTER — Telehealth: Payer: Self-pay | Admitting: Podiatry

## 2022-06-03 DIAGNOSIS — M7752 Other enthesopathy of left foot: Secondary | ICD-10-CM

## 2022-06-03 NOTE — Telephone Encounter (Signed)
Referral has been sent.

## 2022-06-03 NOTE — Telephone Encounter (Signed)
-----   Message from Trula Slade, DPM sent at 06/01/2022  7:02 PM EST ----- Jody Turner, can you please schedule a follow up to discuss MRI results? I did send her a mychart message with the results. Thanks!

## 2022-06-03 NOTE — Telephone Encounter (Signed)
I spoke with the patient , she is going to follow up with Dr Andree Elk at [facility] to discuss surgery.  She said to thank you for everything you have done.

## 2022-07-16 ENCOUNTER — Telehealth: Payer: Medicare HMO | Admitting: Emergency Medicine

## 2022-07-16 DIAGNOSIS — R0989 Other specified symptoms and signs involving the circulatory and respiratory systems: Secondary | ICD-10-CM | POA: Diagnosis not present

## 2022-07-16 MED ORDER — BENZONATATE 100 MG PO CAPS
200.0000 mg | ORAL_CAPSULE | Freq: Three times a day (TID) | ORAL | 0 refills | Status: DC
Start: 2022-07-16 — End: 2023-11-21

## 2022-07-16 MED ORDER — OSELTAMIVIR PHOSPHATE 75 MG PO CAPS: 75.0000 mg | ORAL_CAPSULE | Freq: Two times a day (BID) | ORAL | 0 refills | Status: DC

## 2022-07-16 NOTE — Patient Instructions (Signed)
Amado Nash, thank you for joining Carvel Getting, NP for today's virtual visit.  While this provider is not your primary care provider (PCP), if your PCP is located in our provider database this encounter information will be shared with them immediately following your visit.   Speed account gives you access to today's visit and all your visits, tests, and labs performed at [facility] " click here if you don't have a [facility] account or go to [email or web address]  Consent: (Patient) Jody Turner provided verbal consent for this virtual visit at the beginning of the encounter.  Current Medications:  Current Outpatient Medications:    oseltamivir (TAMIFLU) 75 MG capsule, Take 1 capsule (75 mg total) by mouth 2 (two) times daily., Disp: 10 capsule, Rfl: 0   Adalimumab 40 MG/0.4ML PNKT, Inject '80mg'$  SQ on Day 1, '40mg'$  on Day 8, then '40mg'$  every other week Quantity 4 Maintenance: Inject '40mg'$  SQ every other week Quantity 2, Disp: , Rfl:    albuterol (PROVENTIL HFA;VENTOLIN HFA) 108 (90 Base) MCG/ACT inhaler, Inhale into the lungs., Disp: , Rfl:    benzonatate (TESSALON) 100 MG capsule, Take 2 capsules (200 mg total) by mouth every 8 (eight) hours., Disp: 21 capsule, Rfl: 0   brimonidine (ALPHAGAN) 0.2 % ophthalmic solution, Place 1 drop into both eyes every 8 hours., Disp: , Rfl:    busPIRone (BUSPAR) 15 MG tablet, Take by mouth., Disp: , Rfl:    BUSPIRONE HCL PO, Take by mouth., Disp: , Rfl:    citalopram (CELEXA) 20 MG tablet, Take 20 mg by mouth daily., Disp: , Rfl:    cycloSPORINE modified (NEORAL) 25 MG capsule, Take 75 mg by mouth 2 (two) times daily., Disp: , Rfl:    diclofenac (VOLTAREN) 75 MG EC tablet, Take by mouth., Disp: , Rfl:    diclofenac sodium (VOLTAREN) 1 % GEL, Apply 2 g topically 4 (four) times daily. Rub into affected area of foot 2 to 4 times daily, Disp: 100 g, Rfl: 2   esomeprazole (NEXIUM) 20 MG capsule, Take by mouth., Disp: , Rfl:     estradiol (VIVELLE-DOT) 0.025 MG/24HR, estradiol 0.025 mg/24 hr semiweekly transdermal patch, Disp: , Rfl:    fluticasone (FLONASE) 50 MCG/ACT nasal spray, Place into the nose., Disp: , Rfl:    folic acid (FOLVITE) 1 MG tablet, Take 1 tablet by mouth daily., Disp: , Rfl:    gabapentin (NEURONTIN) 100 MG capsule, Take 1 capsule (100 mg total) by mouth at bedtime., Disp: 90 capsule, Rfl: 3   HYDROcodone-acetaminophen (NORCO/VICODIN) 5-325 MG tablet, Limit one half to one tablet by mouth per day if tolerated, Disp: 30 tablet, Rfl: 0   ipratropium (ATROVENT) 0.06 % nasal spray, Place 2 sprays into both nostrils 4 (four) times daily., Disp: 15 mL, Rfl: 12   lansoprazole (PREVACID) 15 MG capsule, Take by mouth., Disp: , Rfl:    lidocaine (LIDODERM) 5 %, PLEASE SEE ATTACHED FOR DETAILED DIRECTIONS, Disp: , Rfl:    Loratadine 10 MG CAPS, Take by mouth., Disp: , Rfl:    meloxicam (MOBIC) 7.5 MG tablet, meloxicam 7.5 mg tablet  Take 1 tablet twice a day by oral route with meals., Disp: 30 tablet, Rfl: 0   methylPREDNISolone (MEDROL DOSEPAK) 4 MG TBPK tablet, Take as directed, Disp: 21 tablet, Rfl: 0   progesterone (PROMETRIUM) 100 MG capsule, progesterone micronized 100 mg capsule, Disp: , Rfl:    propranolol (INDERAL) 10 MG tablet, Take 10 mg by mouth  daily., Disp: , Rfl:    spironolactone (ALDACTONE) 25 MG tablet, Take 25 mg by mouth daily., Disp: , Rfl:    timolol (TIMOPTIC) 0.5 % ophthalmic solution, 1 drop 2 (two) times daily., Disp: , Rfl:    tiZANidine (ZANAFLEX) 2 MG tablet, Take by mouth., Disp: , Rfl:    topiramate (TOPAMAX) 25 MG capsule, Take 50 mg by mouth 2 (two) times daily., Disp: , Rfl:    Vitamin D, Ergocalciferol, (DRISDOL) 50000 UNITS CAPS, Take 50,000 Units by mouth every 7 (seven) days. Friday, Disp: , Rfl:    Medications ordered in this encounter:  Meds ordered this encounter  Medications   oseltamivir (TAMIFLU) 75 MG capsule    Sig: Take 1 capsule (75 mg total) by mouth 2  (two) times daily.    Dispense:  10 capsule    Refill:  0   benzonatate (TESSALON) 100 MG capsule    Sig: Take 2 capsules (200 mg total) by mouth every 8 (eight) hours.    Dispense:  21 capsule    Refill:  0     *If you need refills on other medications prior to your next appointment, please contact your pharmacy*  Follow-Up: Call back or seek an in-person evaluation if the symptoms worsen or if the condition fails to improve as anticipated.  [place] [phone]  Other Instructions Continue using over-the-counter medicine to help manage her symptoms such as Mucinex.  Saline nasal spray can help with congestion as well and can be used as often as needed.   If you have been instructed to have an in-person evaluation today at a local Urgent Care facility, please use the link below. It will take you to a list of all of our available [facility], including address, phone number and hours of operation. Please do not delay care.  [facility]  If you or a family member do not have a primary care provider, use the link below to schedule a visit and establish care. When you choose a [facility] primary care physician or advanced practice provider, you gain a long-term partner in health. Find a Primary Care Provider  Learn more about [facility]'s in-office and virtual care options: [facility]

## 2022-07-16 NOTE — Progress Notes (Signed)
Virtual Visit Consent   MIKAL WISMAN, you are scheduled for a virtual visit with a [facility] provider today. Just as with appointments in the office, your consent must be obtained to participate. Your consent will be active for this visit and any virtual visit you may have with one of our providers in the next 365 days. If you have a MyChart account, a copy of this consent can be sent to you electronically.  As this is a virtual visit, video technology does not allow for your provider to perform a traditional examination. This may limit your provider's ability to fully assess your condition. If your provider identifies any concerns that need to be evaluated in person or the need to arrange testing (such as labs, EKG, etc.), we will make arrangements to do so. Although advances in technology are sophisticated, we cannot ensure that it will always work on either your end or our end. If the connection with a video visit is poor, the visit may have to be switched to a telephone visit. With either a video or telephone visit, we are not always able to ensure that we have a secure connection.  By engaging in this virtual visit, you consent to the provision of healthcare and authorize for your insurance to be billed (if applicable) for the services provided during this visit. Depending on your insurance coverage, you may receive a charge related to this service.  I need to obtain your verbal consent now. Are you willing to proceed with your visit today? HENREITTA SPITTLER has provided verbal consent on 07/16/2022 for a virtual visit (video or telephone). Carvel Getting, NP  Date: 07/16/2022 11:33 AM  Virtual Visit via Video Note   I, Carvel Getting, connected with  CHELBI HERBER  ([id number], 01-06-1961) on 07/16/22 at 11:30 AM EST by a video-enabled telemedicine application and verified that I am speaking with the correct person using two identifiers.  Location: Patient: Virtual Visit Location Patient:  Home Provider: Virtual Visit Location Provider: Home Office   I discussed the limitations of evaluation and management by telemedicine and the availability of in person appointments. The patient expressed understanding and agreed to proceed.    History of Present Illness: Jody Turner is a 61 y.o. who identifies as a female who was assigned female at birth, and is being seen today for suspected influenza.  Patient and her husband have been sick for the last couple of days starting the evening of 07/14/2022.  Her husband was seen in a walk-in clinic yesterday tested for COVID, strep, and flu, and tested positive for influenza.  Patient suspects that she has influenza since they have the exact same symptoms.  She complains of cough, nasal congestion, headache, feeling weak, body aches, and chills.  She has not had a fever.  She did get the flu vaccine this year and is disappointed that she thinks she has influenza.  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  BP 147/65, heart rate 71, respiratory rate 15, SpO2 98%.  Temp 96.5 F.  Has been using Mucinex for her symptoms.  Request Tamiflu and Tessalon.  HPI: HPI  Problems:  Patient Active Problem List   Diagnosis Date Noted   Depressive disorder 01/05/2021   Disorder of bursae of shoulder region 01/05/2021   Enthesopathy of hip region 01/05/2021   Insomnia 01/05/2021   Low back pain 01/05/2021   Adalimumab (Humira) long-term use 08/25/2020   Allergies 08/25/2020  Anxiety 08/25/2020   Asthma 08/25/2020   Essential (primary) hypertension 08/25/2020   Fibromyalgia 08/25/2020   Glaucoma 08/25/2020   Hormone replacement therapy 08/25/2020   Migraines 08/25/2020   Morbid obesity with BMI of 40.0-44.9, adult ([place]) 08/25/2020   Narcotic drug use 08/25/2020   Postmenopausal 08/25/2020   Vitamin D deficiency 08/25/2020   Primary osteoarthritis of right knee 11/07/2019   High risk medication use 12/13/2017   Nuclear sclerotic cataract of both  eyes 12/13/2017   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   Posterior synechiae (iris), left eye 12/13/2017   Retinal edema 12/13/2017   Chronic neck and back pain 04/05/2017   S/P cervical spinal fusion 04/05/2017   S/P lumbar laminectomy 04/05/2017   Onychomycosis 05/01/2016   Chronic left shoulder pain 10/06/2015   Greater trochanteric bursitis 08/21/2015   DDD (degenerative disc disease), lumbar 12/29/2014   DDD (degenerative disc disease), cervical 12/29/2014   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  Medicated  with zofran pre-procedure and given vistaril with demeral.  Pt was not nauseated upon discharge.   Gluten Itching    Bloating    Gluten Meal Itching and Other (See Comments)    Bloating  Bloating  Bloating  Bloating  Bloating     Morphine And Related Nausea And Vomiting    Patient can take this but needs medication for nausea with it   Medications:  Current Outpatient Medications:    oseltamivir (TAMIFLU) 75 MG capsule, Take 1 capsule (75 mg total) by mouth 2 (two) times daily., Disp: 10 capsule, Rfl: 0   Adalimumab 40 MG/0.4ML PNKT, Inject '80mg'$  SQ on Day 1, '40mg'$  on Day 8, then '40mg'$  every other week Quantity 4 Maintenance: Inject '40mg'$  SQ every other week Quantity 2, Disp: , Rfl:    albuterol (PROVENTIL HFA;VENTOLIN HFA) 108 (90 Base) MCG/ACT inhaler, Inhale into the lungs., Disp: , Rfl:    benzonatate (TESSALON) 100 MG capsule, Take 2 capsules (200 mg total) by mouth every 8 (eight) hours., Disp: 21 capsule, Rfl: 0   brimonidine  (ALPHAGAN) 0.2 % ophthalmic solution, Place 1 drop into both eyes every 8 hours., Disp: , Rfl:    busPIRone (BUSPAR) 15 MG tablet, Take by mouth., Disp: , Rfl:    BUSPIRONE HCL PO, Take by mouth., Disp: , Rfl:    citalopram (CELEXA) 20 MG tablet, Take 20 mg by mouth daily., Disp: , Rfl:    cycloSPORINE modified (NEORAL) 25 MG capsule, Take 75 mg by mouth 2 (two) times daily., Disp: , Rfl:    diclofenac (VOLTAREN) 75 MG EC tablet, Take by mouth., Disp: , Rfl:    diclofenac sodium (VOLTAREN) 1 % GEL, Apply 2 g topically 4 (four) times daily. Rub into affected area of foot 2 to 4 times daily, Disp: 100 g, Rfl: 2   esomeprazole (NEXIUM) 20 MG capsule, Take by mouth., Disp: , Rfl:    estradiol (VIVELLE-DOT) 0.025 MG/24HR, estradiol 0.025 mg/24 hr semiweekly transdermal patch, Disp: , Rfl:    fluticasone (FLONASE) 50 MCG/ACT nasal spray, Place into the nose., Disp: , Rfl:  folic acid (FOLVITE) 1 MG tablet, Take 1 tablet by mouth daily., Disp: , Rfl:    gabapentin (NEURONTIN) 100 MG capsule, Take 1 capsule (100 mg total) by mouth at bedtime., Disp: 90 capsule, Rfl: 3   HYDROcodone-acetaminophen (NORCO/VICODIN) 5-325 MG tablet, Limit one half to one tablet by mouth per day if tolerated, Disp: 30 tablet, Rfl: 0   ipratropium (ATROVENT) 0.06 % nasal spray, Place 2 sprays into both nostrils 4 (four) times daily., Disp: 15 mL, Rfl: 12   lansoprazole (PREVACID) 15 MG capsule, Take by mouth., Disp: , Rfl:    lidocaine (LIDODERM) 5 %, PLEASE SEE ATTACHED FOR DETAILED DIRECTIONS, Disp: , Rfl:    Loratadine 10 MG CAPS, Take by mouth., Disp: , Rfl:    meloxicam (MOBIC) 7.5 MG tablet, meloxicam 7.5 mg tablet  Take 1 tablet twice a day by oral route with meals., Disp: 30 tablet, Rfl: 0   methylPREDNISolone (MEDROL DOSEPAK) 4 MG TBPK tablet, Take as directed, Disp: 21 tablet, Rfl: 0   progesterone (PROMETRIUM) 100 MG capsule, progesterone micronized 100 mg capsule, Disp: , Rfl:    propranolol (INDERAL) 10 MG  tablet, Take 10 mg by mouth daily., Disp: , Rfl:    spironolactone (ALDACTONE) 25 MG tablet, Take 25 mg by mouth daily., Disp: , Rfl:    timolol (TIMOPTIC) 0.5 % ophthalmic solution, 1 drop 2 (two) times daily., Disp: , Rfl:    tiZANidine (ZANAFLEX) 2 MG tablet, Take by mouth., Disp: , Rfl:    topiramate (TOPAMAX) 25 MG capsule, Take 50 mg by mouth 2 (two) times daily., Disp: , Rfl:    Vitamin D, Ergocalciferol, (DRISDOL) 50000 UNITS CAPS, Take 50,000 Units by mouth every 7 (seven) days. Friday, Disp: , Rfl:   Observations/Objective: Patient is well-developed, well-nourished in no acute distress.  Resting comfortably  at home.  Head is normocephalic, atraumatic.  No labored breathing.  Speech is clear and coherent with logical content.  Patient is alert and oriented at baseline.    Assessment and Plan: 1. Suspected novel influenza A virus infection  Prescribed Tamiflu and Tessalon Perles as requested.  Reviewed supportive care.  Follow Up Instructions: I discussed the assessment and treatment plan with the patient. The patient was provided an opportunity to ask questions and all were answered. The patient agreed with the plan and demonstrated an understanding of the instructions.  A copy of instructions were sent to the patient via MyChart unless otherwise noted below.     The patient was advised to call back or seek an in-person evaluation if the symptoms worsen or if the condition fails to improve as anticipated.  Time:  I spent 10 minutes with the patient via telehealth technology discussing the above problems/concerns.    Carvel Getting, NP

## 2022-08-05 ENCOUNTER — Ambulatory Visit: Payer: Medicare HMO | Admitting: Dermatology

## 2022-08-05 VITALS — BP 125/74 | HR 77

## 2022-08-05 DIAGNOSIS — L821 Other seborrheic keratosis: Secondary | ICD-10-CM

## 2022-08-05 DIAGNOSIS — D1801 Hemangioma of skin and subcutaneous tissue: Secondary | ICD-10-CM

## 2022-08-05 DIAGNOSIS — Z85828 Personal history of other malignant neoplasm of skin: Secondary | ICD-10-CM

## 2022-08-05 DIAGNOSIS — Z1283 Encounter for screening for malignant neoplasm of skin: Secondary | ICD-10-CM

## 2022-08-05 DIAGNOSIS — L578 Other skin changes due to chronic exposure to nonionizing radiation: Secondary | ICD-10-CM | POA: Diagnosis not present

## 2022-08-05 DIAGNOSIS — L814 Other melanin hyperpigmentation: Secondary | ICD-10-CM | POA: Diagnosis not present

## 2022-08-05 DIAGNOSIS — L719 Rosacea, unspecified: Secondary | ICD-10-CM | POA: Diagnosis not present

## 2022-08-05 DIAGNOSIS — D229 Melanocytic nevi, unspecified: Secondary | ICD-10-CM

## 2022-08-05 DIAGNOSIS — I8310 Varicose veins of unspecified lower extremity with inflammation: Secondary | ICD-10-CM

## 2022-08-05 NOTE — Progress Notes (Signed)
Follow-Up Visit   Subjective  Jody Turner is a 62 y.o. female who presents for the following: Hx of BCC (L prox nasalabial). The patient presents for Total-Body Skin Exam (TBSE) for skin cancer screening and mole check.  The patient has spots, moles and lesions to be evaluated, some may be new or changing and the patient has concerns that these could be cancer.   The following portions of the chart were reviewed this encounter and updated as appropriate:   Tobacco  Allergies  Meds  Problems  Med Hx  Surg Hx  Fam Hx     Review of Systems:  No other skin or systemic complaints except as noted in HPI or Assessment and Plan.  Objective  Well appearing patient in no apparent distress; mood and affect are within normal limits.  A full examination was performed including scalp, head, eyes, ears, nose, lips, neck, chest, axillae, abdomen, back, buttocks, bilateral upper extremities, bilateral lower extremities, hands, feet, fingers, toes, fingernails, and toenails. All findings within normal limits unless otherwise noted below.  face Erythema face   Assessment & Plan  Rosacea face  Rosacea is a chronic progressive skin condition usually affecting the face of adults, causing redness and/or acne bumps. It is treatable but not curable. It sometimes affects the eyes (ocular rosacea) as well. It may respond to topical and/or systemic medication and can flare with stress, sun exposure, alcohol, exercise, topical steroids (including hydrocortisone/cortisone 10) and some foods.  Daily application of broad spectrum spf 30+ sunscreen to face is recommended to reduce flares.  Lentigines - Scattered tan macules - Due to sun exposure - Benign-appearing, observe - Recommend daily broad spectrum sunscreen SPF 30+ to sun-exposed areas, reapply every 2 hours as needed. - Call for any changes - upper back  Seborrheic Keratoses - Stuck-on, waxy, tan-brown papules and/or plaques  -  Benign-appearing - Discussed benign etiology and prognosis. - Observe - Call for any changes - trunk, arms  Melanocytic Nevi - Tan-brown and/or pink-flesh-colored symmetric macules and papules - Benign appearing on exam today - Observation - Call clinic for new or changing moles - Recommend daily use of broad spectrum spf 30+ sunscreen to sun-exposed areas.  - trunk  Hemangiomas - Red papules - Discussed benign nature - Observe - Call for any changes - trunk  Actinic Damage - Chronic condition, secondary to cumulative UV/sun exposure - diffuse scaly erythematous macules with underlying dyspigmentation - Recommend daily broad spectrum sunscreen SPF 30+ to sun-exposed areas, reapply every 2 hours as needed.  - Staying in the shade or wearing long sleeves, sun glasses (UVA+UVB protection) and wide brim hats (4-inch brim around the entire circumference of the hat) are also recommended for sun protection.  - Call for new or changing lesions.  Skin cancer screening performed today.   History of Basal Cell Carcinoma of the Skin - No evidence of recurrence today - Recommend regular full body skin exams - Recommend daily broad spectrum sunscreen SPF 30+ to sun-exposed areas, reapply every 2 hours as needed.  - Call if any new or changing lesions are noted between office visits  - L prox nasolabial, neck  Varicose Veins/Spider Veins - Dilated blue, purple or red veins at the lower extremities - Reassured - Smaller vessels can be treated by sclerotherapy (a procedure to inject a medicine into the veins to make them disappear) if desired, but the treatment is not covered by insurance. Larger vessels may be covered if symptomatic and we  would refer to vascular surgeon if treatment desired.  - bil legs  Return in about 1 year (around 08/06/2023) for TBSE, Hx of BCC.  I, Othelia Pulling, RMA, am acting as scribe for Sarina Ser, MD . Documentation: I have reviewed the above  documentation for accuracy and completeness, and I agree with the above.  Sarina Ser, MD

## 2022-08-05 NOTE — Patient Instructions (Signed)
Due to recent changes in healthcare laws, you may see results of your pathology and/or laboratory studies on MyChart before the doctors have had a chance to review them. We understand that in some cases there may be results that are confusing or concerning to you. Please understand that not all results are received at the same time and often the doctors may need to interpret multiple results in order to provide you with the best plan of care or course of treatment. Therefore, we ask that you please give us 2 business days to thoroughly review all your results before contacting the office for clarification. Should we see a critical lab result, you will be contacted sooner.   If You Need Anything After Your Visit  If you have any questions or concerns for your doctor, please call our main line at [phone] and press option 4 to reach your doctor's medical assistant. If no one answers, please leave a voicemail as directed and we will return your call as soon as possible. Messages left after 4 pm will be answered the following business day.   You may also send us a message via MyChart. We typically respond to MyChart messages within 1-2 business days.  For prescription refills, please ask your pharmacy to contact our office. Our fax number is [phone].  If you have an urgent issue when the clinic is closed that cannot wait until the next business day, you can page your doctor at the number below.    Please note that while we do our best to be available for urgent issues outside of office hours, we are not available 24/7.   If you have an urgent issue and are unable to reach us, you may choose to seek medical care at your doctor's office, retail clinic, urgent care center, or emergency room.  If you have a medical emergency, please immediately call 911 or go to the emergency department.  Pager Numbers  - Dr. Kowalski: [phone]  - Dr. Moye: [phone]  - Dr. Stewart:  [phone]  In the event of inclement weather, please call our main line at [phone] for an update on the status of any delays or closures.  Dermatology Medication Tips: Please keep the boxes that topical medications come in in order to help keep track of the instructions about where and how to use these. Pharmacies typically print the medication instructions only on the boxes and not directly on the medication tubes.   If your medication is too expensive, please contact our office at [phone] option 4 or send us a message through MyChart.   We are unable to tell what your co-pay for medications will be in advance as this is different depending on your insurance coverage. However, we may be able to find a substitute medication at lower cost or fill out paperwork to get insurance to cover a needed medication.   If a prior authorization is required to get your medication covered by your insurance company, please allow us 1-2 business days to complete this process.  Drug prices often vary depending on where the prescription is filled and some pharmacies may offer cheaper prices.  The website www.goodrx.com contains coupons for medications through different pharmacies. The prices here do not account for what the cost may be with help from insurance (it may be cheaper with your insurance), but the website can give you the price if you did not use any insurance.  - You can print the associated coupon and take it with   your prescription to the pharmacy.  - You may also stop by our office during regular business hours and pick up a GoodRx coupon card.  - If you need your prescription sent electronically to a different pharmacy, notify our office through [facility] MyChart or by phone at [phone] option 4.     Si Usted Necesita Algo Despus de Su Visita  Tambin puede enviarnos un mensaje a travs de MyChart. Por lo general respondemos a los mensajes de MyChart en el transcurso de 1 a 2  das hbiles.  Para renovar recetas, por favor pida a su farmacia que se ponga en contacto con nuestra oficina. Nuestro nmero de fax es el [phone].  Si tiene un asunto urgente cuando la clnica est cerrada y que no puede esperar hasta el siguiente da hbil, puede llamar/localizar a su doctor(a) al nmero que aparece a continuacin.   Por favor, tenga en cuenta que aunque hacemos todo lo posible para estar disponibles para asuntos urgentes fuera del horario de oficina, no estamos disponibles las 24 horas del da, los 7 das de la semana.   Si tiene un problema urgente y no puede comunicarse con nosotros, puede optar por buscar atencin mdica  en el consultorio de su doctor(a), en una clnica privada, en un centro de atencin urgente o en una sala de emergencias.  Si tiene una emergencia mdica, por favor llame inmediatamente al 911 o vaya a la sala de emergencias.  Nmeros de bper  - Dr. Kowalski: [phone]  - Dra. Moye: [phone]  - Dra. Stewart: [phone]  En caso de inclemencias del tiempo, por favor llame a nuestra lnea principal al [phone] para una actualizacin sobre el estado de cualquier retraso o cierre.  Consejos para la medicacin en dermatologa: Por favor, guarde las cajas en las que vienen los medicamentos de uso tpico para ayudarle a seguir las instrucciones sobre dnde y cmo usarlos. Las farmacias generalmente imprimen las instrucciones del medicamento slo en las cajas y no directamente en los tubos del medicamento.   Si su medicamento es muy caro, por favor, pngase en contacto con nuestra oficina llamando al [phone] y presione la opcin 4 o envenos un mensaje a travs de MyChart.   No podemos decirle cul ser su copago por los medicamentos por adelantado ya que esto es diferente dependiendo de la cobertura de su seguro. Sin embargo, es posible que podamos encontrar un medicamento sustituto a menor costo o llenar un formulario para que el  seguro cubra el medicamento que se considera necesario.   Si se requiere una autorizacin previa para que su compaa de seguros cubra su medicamento, por favor permtanos de 1 a 2 das hbiles para completar este proceso.  Los precios de los medicamentos varan con frecuencia dependiendo del lugar de dnde se surte la receta y alguna farmacias pueden ofrecer precios ms baratos.  El sitio web www.goodrx.com tiene cupones para medicamentos de diferentes farmacias. Los precios aqu no tienen en cuenta lo que podra costar con la ayuda del seguro (puede ser ms barato con su seguro), pero el sitio web puede darle el precio si no utiliz ningn seguro.  - Puede imprimir el cupn correspondiente y llevarlo con su receta a la farmacia.  - Tambin puede pasar por nuestra oficina durante el horario de atencin regular y recoger una tarjeta de cupones de GoodRx.  - Si necesita que su receta se enve electrnicamente a una farmacia diferente, informe a nuestra oficina a travs de MyChart de    o por telfono llamando al [phone] y presione la opcin 4.  

## 2022-08-09 ENCOUNTER — Encounter: Payer: Self-pay | Admitting: Dermatology

## 2022-08-24 ENCOUNTER — Other Ambulatory Visit: Payer: Self-pay | Admitting: Obstetrics & Gynecology

## 2022-08-24 DIAGNOSIS — Z1239 Encounter for other screening for malignant neoplasm of breast: Secondary | ICD-10-CM

## 2022-08-24 DIAGNOSIS — Z1231 Encounter for screening mammogram for malignant neoplasm of breast: Secondary | ICD-10-CM

## 2022-11-08 ENCOUNTER — Ambulatory Visit
Admission: RE | Admit: 2022-11-08 | Discharge: 2022-11-08 | Disposition: A | Discharge status: Home / Self Care | Payer: Medicare HMO | Source: Ambulatory Visit | Attending: Nurse Practitioner | Admitting: Nurse Practitioner

## 2022-11-08 DIAGNOSIS — Z1231 Encounter for screening mammogram for malignant neoplasm of breast: Secondary | ICD-10-CM

## 2022-11-19 ENCOUNTER — Other Ambulatory Visit: Payer: Self-pay | Admitting: Podiatry

## 2022-12-08 ENCOUNTER — Ambulatory Visit: Payer: Self-pay

## 2022-12-08 ENCOUNTER — Ambulatory Visit
Admission: RE | Admit: 2022-12-08 | Discharge: 2022-12-08 | Disposition: A | Discharge status: Home / Self Care | Payer: Medicare HMO | Source: Ambulatory Visit | Attending: Family Medicine | Admitting: Family Medicine

## 2022-12-08 ENCOUNTER — Ambulatory Visit: Payer: Medicare HMO

## 2022-12-08 ENCOUNTER — Ambulatory Visit (INDEPENDENT_AMBULATORY_CARE_PROVIDER_SITE_OTHER): Payer: Medicare HMO

## 2022-12-08 ENCOUNTER — Other Ambulatory Visit: Payer: Self-pay

## 2022-12-08 VITALS — BP 102/58 | HR 65 | Temp 98.1°F | Resp 18

## 2022-12-08 DIAGNOSIS — J45901 Unspecified asthma with (acute) exacerbation: Secondary | ICD-10-CM | POA: Diagnosis not present

## 2022-12-08 DIAGNOSIS — M25551 Pain in right hip: Secondary | ICD-10-CM | POA: Diagnosis not present

## 2022-12-08 DIAGNOSIS — W19XXXA Unspecified fall, initial encounter: Secondary | ICD-10-CM | POA: Diagnosis not present

## 2022-12-08 DIAGNOSIS — M79604 Pain in right leg: Secondary | ICD-10-CM

## 2022-12-08 MED ORDER — ALBUTEROL SULFATE (2.5 MG/3ML) 0.083% IN NEBU
2.5000 mg | INHALATION_SOLUTION | Freq: Once | RESPIRATORY_TRACT | Status: AC
  Administered 2022-12-08: 2.5 mg via RESPIRATORY_TRACT

## 2022-12-08 MED ORDER — KETOROLAC TROMETHAMINE 60 MG/2ML IM SOLN
30.0000 mg | Freq: Once | INTRAMUSCULAR | Status: AC
  Administered 2022-12-08: 30 mg via INTRAMUSCULAR

## 2022-12-08 MED ORDER — MELOXICAM 7.5 MG PO TABS: ORAL_TABLET | ORAL | 0 refills | Status: AC

## 2022-12-08 MED ORDER — METHOCARBAMOL 500 MG PO TABS: 500.0000 mg | ORAL_TABLET | Freq: Two times a day (BID) | ORAL | 0 refills | Status: AC

## 2022-12-08 MED ORDER — PREDNISONE 10 MG (21) PO TBPK: ORAL_TABLET | Freq: Every day | ORAL | 0 refills | Status: DC

## 2022-12-08 NOTE — ED Provider Notes (Signed)
[facility]    CSN: [id number] Arrival date & time: 12/08/22  1558      History   Chief Complaint Chief Complaint  Patient presents with   Leg Pain    Appt   Hip Pain        Fall    HPI  HPI Jody Turner is a 62 y.o. female.   Jody Turner presents for leg and hip pain after falling over an electric wheelchair. Her husband moved the chair sideways and she tripped over in the dark.  She fell into the chair.  Her right leg lifted up. She had immediate pain in her hip and buttocks. She moved the chair and was able to get back to the bed. She has not been able to walk very much. Typically uses her electric wheelchair.   She is having some shortness of breath after walking into the urgent care. She left her albuterol inhaler in her other purse. She is having upper chest discomfort    Past Medical History:  Diagnosis Date   Arthritis    HNP- lumbar, DJD, spondylosis   Basal cell carcinoma 12/07/2021   L prox nasolabial - ED&C 02/02/22   Basal cell carcinoma 2015   Neck - treated many years ago by Dr. Adolphus Birchwood   Cancer [facility])    r side of neck- basal cell ca   Depression    depression, trouble sleeping    Fibromyalgia    no medical treatment - treats naturally /w exercise    Hypertension    Osteoarthritis     Patient Active Problem List   Diagnosis Date Noted   Depressive disorder 01/05/2021   Disorder of bursae of shoulder region 01/05/2021   Enthesopathy of hip region 01/05/2021   Insomnia 01/05/2021   Low back pain 01/05/2021   Adalimumab (Humira) long-term use 08/25/2020   Allergies 08/25/2020   Anxiety 08/25/2020   Asthma 08/25/2020   Essential (primary) hypertension 08/25/2020   Fibromyalgia 08/25/2020   Glaucoma 08/25/2020   Hormone replacement therapy 08/25/2020   Migraines 08/25/2020   Morbid obesity with BMI of 40.0-44.9, adult (HCC) 08/25/2020   Narcotic drug use 08/25/2020   Postmenopausal 08/25/2020   Vitamin D deficiency 08/25/2020    Primary osteoarthritis of right knee 11/07/2019   High risk medication use 12/13/2017   Nuclear sclerotic cataract of both eyes 12/13/2017   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   Posterior synechiae (iris), left eye 12/13/2017   Retinal edema 12/13/2017   Chronic neck and back pain 04/05/2017   S/P cervical spinal fusion 04/05/2017   S/P lumbar laminectomy 04/05/2017   Onychomycosis 05/01/2016   Chronic left shoulder pain 10/06/2015   Greater trochanteric bursitis 08/21/2015   DDD (degenerative disc disease), lumbar 12/29/2014   DDD (degenerative disc disease), cervical 12/29/2014   Sacroiliac joint disease 12/29/2014   Lumbar facet arthropathy 12/29/2014   Bilateral occipital neuralgia 12/29/2014    Past Surgical History:  Procedure Laterality Date   BACK SURGERY     corpectomy, cerv. fusion -2004   CARPAL TUNNEL RELEASE Right    CATARACT EXTRACTION W/PHACO Left 11/26/2019   Procedure: CATARACT EXTRACTION PHACO AND INTRAOCULAR LENS PLACEMENT (IOC) LEFT INJECTION OF INTRAVITREAL KENALOG;  Surgeon: Nevada Crane, MD;  Location: [facility];  Service: Ophthalmology;  Laterality: Left;  1.18 0:21.7   CATARACT EXTRACTION W/PHACO Right 12/31/2019   Procedure: CATARACT EXTRACTION PHACO AND INTRAOCULAR LENS PLACEMENT (IOC) RIGHT INTRAVITREAL  KENALOG INJ 0.64  00:15.9;  Surgeon: Nevada Crane, MD;  Location: [facility];  Service: Ophthalmology;  Laterality: Right;   FOOT OSTEOTOMY W/ PLANTAR FASCIA RELEASE     R foot, Coton Evans procedure    LASER ABLATION     both legs, sclerotherapy    LUMBAR LAMINECTOMY/DECOMPRESSION MICRODISCECTOMY  09/17/2011   Procedure: LUMBAR LAMINECTOMY/DECOMPRESSION MICRODISCECTOMY;  Surgeon: Karn Cassis, MD;  Location: [facility];  Service: Neurosurgery;  Laterality: N/A;  Left Lumbar three-four, Right Lumbar four-five Discectomies   TONSILLECTOMY     as a child     VAGINAL DELIVERY     VARICOSE VEIN SURGERY     segmental, bilateral     OB History   No obstetric history on file.      Home Medications    Prior to Admission medications   Medication Sig Start Date End Date Taking? Authorizing Provider  Adalimumab 40 MG/0.4ML PNKT Inject 80mg  SQ on Day 1, 40mg  on Day 8, then 40mg  every other week Quantity 4 Maintenance: Inject 40mg  SQ every other week Quantity 2 02/10/18  Yes [provider]  albuterol (PROVENTIL HFA;VENTOLIN HFA) 108 (90 Base) MCG/ACT inhaler Inhale into the lungs. 11/19/16  Yes [provider]  brimonidine (ALPHAGAN) 0.2 % ophthalmic solution Place 1 drop into both eyes every 8 hours. 11/11/20  Yes [provider]  busPIRone (BUSPAR) 15 MG tablet Take by mouth. 06/12/20  Yes [provider]  BUSPIRONE HCL PO Take by mouth.   Yes [provider]  citalopram (CELEXA) 20 MG tablet Take 20 mg by mouth daily.   Yes [provider]  cycloSPORINE modified (NEORAL) 25 MG capsule Take 75 mg by mouth 2 (two) times daily. 12/07/20  Yes [provider]  diclofenac sodium (VOLTAREN) 1 % GEL Apply 2 g topically 4 (four) times daily. Rub into affected area of foot 2 to 4 times daily 10/20/16  Yes Vivi Barrack, DPM  estradiol (VIVELLE-DOT) 0.025 MG/24HR estradiol 0.025 mg/24 hr semiweekly transdermal patch 06/11/20  Yes [provider]  fluticasone (FLONASE) 50 MCG/ACT nasal spray Place into the nose. 11/19/16  Yes [provider]  HYDROcodone-acetaminophen (NORCO/VICODIN) 5-325 MG tablet Limit one half to one tablet by mouth per day if tolerated 03/08/16  Yes Ewing Schlein, MD  ipratropium (ATROVENT) 0.06 % nasal spray Place 2 sprays into both nostrils 4 (four) times daily. 08/08/21  Yes Becky Augusta, NP  lidocaine (LIDODERM) 5 % PLEASE SEE ATTACHED FOR DETAILED DIRECTIONS 10/08/20  Yes [provider]  Loratadine 10 MG CAPS Take by mouth.   Yes [provider]  methocarbamol (ROBAXIN) 500 MG tablet Take 1 tablet (500 mg total) by mouth 2 (two) times daily. 12/08/22  Yes Jacy Howat, DO  predniSONE (STERAPRED UNI-PAK 21 TAB) 10 MG (21) TBPK tablet Take by mouth daily. Take 6 tabs by mouth daily for 1, then 5 tabs for 1 day, then 4 tabs for 1 day, then 3 tabs for 1 day, then 2 tabs for 1 day, then 1 tab for 1 day. 12/08/22  Yes Eddie Payette, Seward Meth, DO  progesterone (PROMETRIUM) 100 MG capsule progesterone micronized 100 mg capsule 06/11/20  Yes [provider]  propranolol (INDERAL) 10 MG tablet Take 10 mg by mouth daily.   Yes [provider]  spironolactone (ALDACTONE) 25 MG tablet Take 25 mg by mouth daily.   Yes [provider]  timolol (TIMOPTIC) 0.5 % ophthalmic solution 1 drop 2 (two) times daily.  12/19/20  Yes [provider]  tiZANidine (ZANAFLEX) 2 MG tablet Take by mouth. 12/31/20  Yes [provider]  topiramate (TOPAMAX) 25 MG capsule Take 50 mg by mouth 2 (two) times daily.   Yes [provider]  Vitamin D, Ergocalciferol, (DRISDOL) 50000 UNITS CAPS Take 50,000 Units by mouth every 7 (seven) days. Friday   Yes [provider]  benzonatate (TESSALON) 100 MG capsule Take 2 capsules (200 mg total) by mouth every 8 (eight) hours. 07/16/22   Cathlyn Parsons, NP  esomeprazole (NEXIUM) 20 MG capsule Take by mouth.    [provider]  folic acid (FOLVITE) 1 MG tablet Take 1 tablet by mouth daily. 02/10/18   [provider]  gabapentin (NEURONTIN) 100 MG capsule Take 1 capsule (100 mg total) by mouth at bedtime. 05/10/22   Vivi Barrack, DPM  lansoprazole (PREVACID) 15 MG capsule Take by mouth.    [provider]  meloxicam (MOBIC) 7.5 MG tablet meloxicam 7.5 mg tablet  Take 1 tablet twice a day by oral route with meals. 12/08/22   Katha Cabal, DO  oseltamivir (TAMIFLU) 75 MG capsule Take 1 capsule (75 mg total) by mouth 2 (two) times daily.  07/16/22   Cathlyn Parsons, NP    Family History Family History  Problem Relation Age of Onset   Hypertension Mother    Thyroid disease Mother    Depression Mother    Stroke Maternal Grandmother    Other Maternal Grandmother    Anesthesia problems Neg Hx    Hypotension Neg Hx    Malignant hyperthermia Neg Hx    Pseudochol deficiency Neg Hx    Breast cancer Neg Hx     Social History Social History   Tobacco Use   Smoking status: Never   Smokeless tobacco: Never  Vaping Use   Vaping Use: Never used  Substance Use Topics   Alcohol use: No   Drug use: No     Allergies   Sulfa antibiotics, Sulfasalazine, Azathioprine, Demerol [meperidine hcl], Gluten, Gluten meal, and Morphine and codeine   Review of Systems Review of Systems: :negative unless otherwise stated in HPI.      Physical Exam Triage Vital Signs ED Triage Vitals  Enc Vitals Group     BP 12/08/22 1626 (!) 102/58     Pulse Rate 12/08/22 1621 65     Resp 12/08/22 1608 16     Temp 12/08/22 1621 98.1 F (36.7 C)     Temp Source 12/08/22 1608 Oral     SpO2 --      Weight --      Height --      Head Circumference --      Peak Flow --      Pain Score 12/08/22 1618 7     Pain Loc --      Pain Edu? --      Excl. in GC? --    No data found.  Updated Vital Signs BP (!) 102/58   Pulse 65   Temp 98.1 F (36.7 C) (Oral)   Resp 18   LMP 08/27/2011   SpO2 94%   Visual Acuity Right Eye Distance:   Left Eye Distance:   Bilateral Distance:    Right Eye Near:   Left Eye Near:    Bilateral Near:     Physical Exam GEN: well appearing female in no acute distress  CVS: well perfused, regular rate and rhythm  RESP: speaking with pauses  in sentences, clear to auscultation bilaterally MSK: Hip, right: TTP noted at greater trochater, posterior thight, lateral hip and across buttocks. No obvious rash, deformity, erythema, ecchymosis, or edema.  Strength not assessed. Pelvic alignment unremarkable to  inspection and palpation. Antalgic gait  SKIN: warm, dry   UC Treatments / Results  Labs (all labs ordered are listed, but only abnormal results are displayed) Labs Reviewed - No data to display  EKG   Radiology DG Hip Unilat With Pelvis 2-3 Views Right  Result Date: 12/08/2022 CLINICAL DATA:  Right hip and right leg pain after a fall at home. EXAM: DG HIP (WITH OR WITHOUT PELVIS) 2-3V RIGHT; RIGHT FEMUR 2 VIEWS COMPARISON:  None Available. FINDINGS: Pelvis, right hip, and right femur appear intact. No acute or displaced fractures are identified. No focal bone lesion or bone destruction. Moderate degenerative changes in the right hip with joint space narrowing and endplate osteophyte formation. Degenerative changes in the lower lumbar spine. Soft tissues are unremarkable. IMPRESSION: No acute bony abnormalities.  Degenerative changes in the right hip. Electronically Signed   By: Burman Nieves M.D.   On: 12/08/2022 17:28   DG Femur Min 2 Views Right  Result Date: 12/08/2022 CLINICAL DATA:  Right hip and right leg pain after a fall at home. EXAM: DG HIP (WITH OR WITHOUT PELVIS) 2-3V RIGHT; RIGHT FEMUR 2 VIEWS COMPARISON:  None Available. FINDINGS: Pelvis, right hip, and right femur appear intact. No acute or displaced fractures are identified. No focal bone lesion or bone destruction. Moderate degenerative changes in the right hip with joint space narrowing and endplate osteophyte formation. Degenerative changes in the lower lumbar spine. Soft tissues are unremarkable. IMPRESSION: No acute bony abnormalities.  Degenerative changes in the right hip. Electronically Signed   By: Burman Nieves M.D.   On: 12/08/2022 17:28     Procedures Procedures (including critical care time)  Medications Ordered in UC Medications  albuterol (PROVENTIL) (2.5 MG/3ML) 0.083% nebulizer solution 2.5 mg (2.5 mg Nebulization Given 12/08/22 1640)  ketorolac (TORADOL) injection 30 mg (30 mg Intramuscular Given  12/08/22 1733)    Initial Impression / Assessment and Plan / UC Course  I have reviewed the triage vital signs and the nursing notes.  Pertinent labs & imaging results that were available during my care of the patient were reviewed by me and considered in my medical decision making (see chart for details).      Pt is a 62 y.o.  female with acute right leg pain after a fall at home last night.  On exam, pt has tenderness across hip and buttocks extending down right thigh.  Obtained right hip and femur plain films.  Personally interpreted by me were unremarkable for fracture or dislocation. Radiologist report reviewed and additionally notes moderate degenerative changes in right hip and lumbar spine. Discussed oral vs IM pain control and pt requesting IM pain control. Given Toradol IM 30 mg. Serum creatine in Jan 2024 was normal.   Patient to gradually return to normal activities, as tolerated and continue ordinary activities within the limits permitted by pain. Prescribed prednisone taper, Mobic and muscle relaxer (Robaxin) for pain relief. Continue home Norco.  Advised patient to avoid OTC NSAIDs while taking prescription NSAID. Counseled patient on red flag symptoms and when to seek immediate care. Patient to follow up with orthopedic provider, if symptoms do not improve with conservative treatment.   Her cardiopulmonary exam was unremarkable and pt satting 94 % on room air. Pt did not  bring her albuterol inhaler with her. Recommended albuterol nebulizer treatment and she is agreeable. Pt given Albuterol nebulizer with improvement in her respiratory status.    Return and ED precautions given. Understanding voiced. Discussed MDM, treatment plan and plan for follow-up with patient who agrees with plan.   Final Clinical Impressions(s) / UC Diagnoses   Final diagnoses:  Asthma with acute exacerbation, unspecified asthma severity, unspecified whether persistent  Fall, initial encounter  Right hip  pain  Right leg pain     Discharge Instructions      Your x-ray did not show any broken or dislocated bones.  It did show some age-related (degenerative) changes of your hip.  You were given an anti-inflammatory injection here for pain.  This should help with your pain.  Continue taking your home pain medications as prescribed. Call your pain medication provider to let them know if your take more for your new pain.      ED Prescriptions     Medication Sig Dispense Auth. Provider   meloxicam (MOBIC) 7.5 MG tablet meloxicam 7.5 mg tablet  Take 1 tablet twice a day by oral route with meals. 30 tablet Laquinta Hazell, DO   methocarbamol (ROBAXIN) 500 MG tablet Take 1 tablet (500 mg total) by mouth 2 (two) times daily. 30 tablet Annis Lagoy, DO   predniSONE (STERAPRED UNI-PAK 21 TAB) 10 MG (21) TBPK tablet Take by mouth daily. Take 6 tabs by mouth daily for 1, then 5 tabs for 1 day, then 4 tabs for 1 day, then 3 tabs for 1 day, then 2 tabs for 1 day, then 1 tab for 1 day. 21 tablet Katha Cabal, DO      PDMP not reviewed this encounter.   Katha Cabal, DO 12/08/22 2032

## 2022-12-08 NOTE — ED Triage Notes (Addendum)
Pt was walking and fell into a wheel chair and landed on right side, fell into wheel chair and struck right shoulder and right hip. Now c/o pain across shoulders and neck and lower back pain. States pain is radiating down right hip down right leg to right calf

## 2022-12-08 NOTE — Discharge Instructions (Addendum)
Your x-ray did not show any broken or dislocated bones.  It did show some age-related (degenerative) changes of your hip.  You were given an anti-inflammatory injection here for pain.  This should help with your pain.  Continue taking your home pain medications as prescribed. Call your pain medication provider to let them know if your take more for your new pain.

## 2023-08-10 ENCOUNTER — Ambulatory Visit: Payer: Medicare HMO | Admitting: Dermatology

## 2023-11-20 NOTE — Progress Notes (Unsigned)
 Cardiology Office Note  Date:  11/21/2023   ID:  Jody Turner, DOB 1960-08-09, MRN [id number]  PCP:  Lyle San, MD   Chief Complaint  Patient presents with   New Patient (Initial Visit)    Ref by Dr. Dean Every for tachycardia, chest pain, bradycardia. Patient c/o weakness & fatigue, shortness of breath with little to no activity & racing heart beats most days.   Patient was at [facility] ER on 09/05/2023; tachycardia-wore a 3 day heart monitor.     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 Dean Every for tachycardia, atypical chest pain, bradycardia weakness, fatigue, shortness of breath on exertion  Seen by primary care October 06, 2023 Primary CARE details that she had been seen in the ER for chest pain acute onset of tachycardia shortness of breath Presenting to the [facility] feb 2025 episode of chest pain, palpitations She looked at her Apple Watch and it said she was in atrial fibrillation rate 133 lasting 6 hours but resolved before she was seen in the ER She took propranolol which she has at home She did have a " wet cough" at the time, was not on her asthma medication Holter monitor was placed Echo was performed  During her hospital course she remained asymptomatic, no arrhythmia on the monitor  Echocardiogram September 05, 2023  normal LV size and function no LVH Otherwise normal study  Holter monitor Sinus rhythm, Heart rate range 192 bpm, 02/13 18:17:59,  the Minimum Heart Rate recorded was 48 bpm, 02/11 11:31:16, and the Average Heart Rate was 67 bpm. *There were 90 VE beats with a burden of <1 %. *There were 438 SVE beats with a burden of <1 %. There were 7 occurrences of Supraventricular Tachycardia with the Fastest episode 192 bpm, 02/13 18:17:57, and the Longest episode 15 beats, 02/13 18:17:57.   *There were 0 Patient Triggers.     Sleep study scheduled in 11/29/23 ordered by pulmonary Snores at home  EKG personally reviewed by myself on todays visit EKG Interpretation Date/Time:  Monday November 21 2023 08:21:22 EDT Ventricular Rate:  60 PR Interval:  174 QRS Duration:  84 QT Interval:  416 QTC Calculation: 416 R Axis:   22  Text Interpretation: Normal sinus rhythm Low voltage QRS When compared with ECG of 21-Nov-2023 08:21, No significant change was found Confirmed by Belva Boyden [phone]) on 11/21/2023 8:24:30 AM    PMH:   has a past medical history of Arthritis, Basal cell carcinoma (12/07/2021), Basal cell carcinoma (2015), Cancer (HCC), Depression, Fibromyalgia, Hypertension, and Osteoarthritis.  PSH:    Past Surgical History:  Procedure Laterality Date   BACK SURGERY     corpectomy, cerv. fusion -2004   CARPAL TUNNEL RELEASE Right    CATARACT EXTRACTION W/PHACO Left 11/26/2019   Procedure: CATARACT EXTRACTION PHACO AND INTRAOCULAR LENS PLACEMENT (IOC) LEFT INJECTION OF INTRAVITREAL KENALOG ;  Surgeon: Rosa College, MD;  Location: [facility];  Service: Ophthalmology;  Laterality: Left;  1.18 0:21.7   CATARACT EXTRACTION W/PHACO Right 12/31/2019   Procedure: CATARACT EXTRACTION PHACO AND INTRAOCULAR LENS PLACEMENT (IOC) RIGHT INTRAVITREAL KENALOG  INJ 0.64  00:15.9;  Surgeon: Rosa College, MD;  Location: [facility];  Service: Ophthalmology;  Laterality: Right;   FOOT OSTEOTOMY W/ PLANTAR FASCIA RELEASE     R foot, Coton Evans procedure    LASER  ABLATION     both legs, sclerotherapy    LUMBAR LAMINECTOMY/DECOMPRESSION MICRODISCECTOMY  09/17/2011   Procedure: LUMBAR LAMINECTOMY/DECOMPRESSION MICRODISCECTOMY;  Surgeon: Adelbert Adler, MD;  Location: [facility];  Service: Neurosurgery;  Laterality: N/A;  Left Lumbar three-four, Right Lumbar four-five Discectomies   TONSILLECTOMY     as a child    VAGINAL DELIVERY     VARICOSE VEIN SURGERY      segmental, bilateral     Current Outpatient Medications  Medication Sig Dispense Refill   Adalimumab 40 MG/0.4ML PNKT Inject 80mg  SQ on Day 1, 40mg  on Day 8, then 40mg  every other week Quantity 4 Maintenance: Inject 40mg  SQ every other week Quantity 2     albuterol  (PROVENTIL  HFA;VENTOLIN  HFA) 108 (90 Base) MCG/ACT inhaler Inhale into the lungs.     brimonidine (ALPHAGAN) 0.2 % ophthalmic solution Place 1 drop into both eyes every 8 hours.     BUSPIRONE HCL PO Take by mouth.     citalopram  (CELEXA ) 20 MG tablet Take 20 mg by mouth daily.     cycloSPORINE modified (NEORAL) 25 MG capsule Take 75 mg by mouth 2 (two) times daily.     diclofenac  sodium (VOLTAREN ) 1 % GEL Apply 2 g topically 4 (four) times daily. Rub into affected area of foot 2 to 4 times daily 100 g 2   estradiol (VIVELLE-DOT) 0.025 MG/24HR estradiol 0.025 mg/24 hr semiweekly transdermal patch     fluticasone (FLONASE) 50 MCG/ACT nasal spray Place into the nose.     HYDROcodone -acetaminophen  (NORCO/VICODIN) 5-325 MG tablet Limit one half to one tablet by mouth per day if tolerated 30 tablet 0   ipratropium (ATROVENT ) 0.06 % nasal spray Place 2 sprays into both nostrils 4 (four) times daily. 15 mL 12   lidocaine  (LIDODERM ) 5 % PLEASE SEE ATTACHED FOR DETAILED DIRECTIONS     Loratadine 10 MG CAPS Take by mouth.     meloxicam  (MOBIC ) 7.5 MG tablet meloxicam  7.5 mg tablet  Take 1 tablet twice a day by oral route with meals. 30 tablet 0   progesterone (PROMETRIUM) 100 MG capsule progesterone micronized 100 mg capsule     propranolol (INDERAL) 20 MG tablet Take 1 tablet (20 mg total) by mouth 3 (three) times daily as needed (for fast heart rate). 90 tablet 2   spironolactone (ALDACTONE) 25 MG tablet Take 25 mg by mouth daily.     timolol (TIMOPTIC) 0.5 % ophthalmic solution 1 drop 2 (two) times daily.     tiZANidine  (ZANAFLEX ) 2 MG tablet Take by mouth.     topiramate  (TOPAMAX ) 25 MG capsule Take 50 mg by mouth 2 (two) times daily.      Vitamin D , Ergocalciferol , (DRISDOL ) 50000 UNITS CAPS Take 50,000 Units by mouth every 7 (seven) days. Friday     busPIRone (BUSPAR) 15 MG tablet Take by mouth.     gabapentin  (NEURONTIN ) 100 MG capsule Take 1 capsule (100 mg total) by mouth at bedtime. (Patient not taking: Reported on 11/21/2023) 90 capsule 3   methocarbamol  (ROBAXIN ) 500 MG tablet Take 1 tablet (500 mg total) by mouth 2 (two) times daily. (Patient not taking: Reported on 11/21/2023) 30 tablet 0   No current facility-administered medications for this visit.    Allergies:   Sulfa antibiotics, Sulfasalazine, Azathioprine, Morphine  sulfate, Gluten, Gluten meal, Meperidine  hcl, Meperidine  hcl, Morphine , and Morphine  and codeine   Social History:  The patient  reports that she has never smoked. She has never used smokeless tobacco. She  reports that she does not drink alcohol and does not use drugs.   Family History:   family history includes Depression in her mother; Emphysema in her brother; Heart failure in her father; Hypertension in her mother; Other in her maternal grandmother; Stroke in her maternal grandmother; Thyroid disease in her mother.    Review of Systems: Review of Systems  Constitutional: Negative.   HENT: Negative.    Respiratory: Negative.    Cardiovascular: Negative.   Gastrointestinal: Negative.   Musculoskeletal: Negative.   Neurological: Negative.   Psychiatric/Behavioral: Negative.    All other systems reviewed and are negative.  PHYSICAL EXAM: VS:  BP 120/78 (BP Location: Right Arm, Patient Position: Sitting, Cuff Size: Large)   Pulse 60   Ht 5\' 6"  (1.676 m)   Wt 269 lb 2 oz (122.1 kg)   LMP 08/27/2011   SpO2 98%   BMI 43.44 kg/m  , BMI Body mass index is 43.44 kg/m. GEN: obese, Well nourished, well developed, in no acute distress HEENT: normal Neck: no JVD, carotid bruits, or masses Cardiac: RRR; no murmurs, rubs, or gallops,no edema  Respiratory:  clear to auscultation bilaterally,  normal work of breathing GI: soft, nontender, nondistended, + BS MS: no deformity or atrophy Skin: warm and dry, no rash Neuro:  Strength and sensation are intact Psych: euthymic mood, full affect  Recent Labs: No results found for requested labs within last 365 days.    Lipid Panel No results found for: "CHOL", "HDL", "LDLCALC", "TRIG"    Wt Readings from Last 3 Encounters:  11/21/23 269 lb 2 oz (122.1 kg)  08/08/21 238 lb 1.6 oz (108 kg)  12/31/19 238 lb (108 kg)       ASSESSMENT AND PLAN:  Problem List Items Addressed This Visit   None Visit Diagnoses       Paroxysmal tachycardia (HCC)    -  Primary   Relevant Medications   propranolol (INDERAL) 20 MG tablet     Morbid obesity (HCC)         Atypical chest pain       Relevant Orders   EKG 12-Lead (Completed)     Shortness of breath         Weakness          Paroxysmal tachycardia Unclear etiology, possibly atrial fibrillation or atrial tachycardia No rhythm strips from the Apple Watch available Broke after propranolol 10 mg, no further arrhythmia on hospital telemetry or Holter monitor for 2 days No prior episodes and no episodes since that time Recommend pill in the pocket approach for now with propranolol 20 mg as needed for recurrent arrhythmia -Echocardiogram results reviewed, no structural abnormality noted that would contribute to atrial fibrillation - Sleep study scheduled next week - Discussed weight as a trigger for atrial fibrillation and other arrhythmia - Recommend she consider changing albuterol  to Xopenex both inhaler and nebulizer - Reports her asthma has been poorly controlled, unable to walk very far without having asthma attacks, stridor episodes with coughing fits Unable to exclude asthma as a trigger for her arrhythmia -No other cardiac testing needed at this time  Hospital records requested and reviewed from [facility]     Signed, Juanda Noon, M.D., Ph.D. [facility]  [place], [place] [phone]

## 2023-11-21 ENCOUNTER — Encounter: Payer: Self-pay | Admitting: Cardiovascular Disease

## 2023-11-21 ENCOUNTER — Ambulatory Visit: Attending: Cardiovascular Disease | Admitting: Cardiovascular Disease

## 2023-11-21 VITALS — BP 120/78 | HR 60 | Ht 66.0 in | Wt 269.1 lb

## 2023-11-21 DIAGNOSIS — R0789 Other chest pain: Secondary | ICD-10-CM | POA: Diagnosis not present

## 2023-11-21 DIAGNOSIS — R531 Weakness: Secondary | ICD-10-CM

## 2023-11-21 DIAGNOSIS — I495 Sick sinus syndrome: Secondary | ICD-10-CM

## 2023-11-21 DIAGNOSIS — R0602 Shortness of breath: Secondary | ICD-10-CM | POA: Diagnosis not present

## 2023-11-21 DIAGNOSIS — I479 Paroxysmal tachycardia, unspecified: Secondary | ICD-10-CM | POA: Diagnosis not present

## 2023-11-21 MED ORDER — PROPRANOLOL HCL 20 MG PO TABS: 20.0000 mg | ORAL_TABLET | Freq: Three times a day (TID) | ORAL | 2 refills | Status: AC | PRN

## 2023-11-21 NOTE — Patient Instructions (Addendum)
 Medication Instructions:  Continue propranolol 20 mg as needed for tachycardia spells  Ask pulmonary about xopenex  Apple watch for monitoring  If you need a refill on your cardiac medications before your next appointment, please call your pharmacy.   Lab work: No new labs needed  Testing/Procedures: No new testing needed  Follow-Up: At [facility], you and your health needs are our priority.  As part of our continuing mission to provide you with exceptional heart care, we have created designated Provider Care Teams.  These Care Teams include your primary Cardiologist (physician) and Advanced Practice Providers (APPs -  Physician Assistants and Nurse Practitioners) who all work together to provide you with the care you need, when you need it.  You will need a follow up appointment in 12 months  Providers on your designated Care Team:   Laneta Pintos, NP Varney Gentleman, PA-C Cadence Gennaro Khat, [place]  COVID-19 Vaccine Information can be found at: [email or web address] For questions related to vaccine distribution or appointments, please email [email or web address] or call [phone].

## 2024-01-20 ENCOUNTER — Ambulatory Visit: Admitting: Cardiovascular Disease

## 2024-02-09 ENCOUNTER — Ambulatory Visit: Admitting: Podiatry

## 2024-02-09 VITALS — Ht 66.0 in | Wt 269.1 lb

## 2024-02-09 DIAGNOSIS — L603 Nail dystrophy: Secondary | ICD-10-CM | POA: Diagnosis not present

## 2024-02-09 NOTE — Progress Notes (Unsigned)
 Subjective: Chief Complaint  Patient presents with   Nail Problem    RM 12 Patient is here is due to loss of right hallux nail. Patient states right hallux nail has fallen off twice in the last four months and when the nail grows it is rough.Patient states no trauma to the right foot. Patient is also concerned with the left hallux nail discoloration after injuring the toe two months ago.   63 year old female presents the office today with concerns of her right big toenail coming off.  She states that it will grow and eventually just fall off on its own.  She states the nail is off and there is no nail to look at today and she has no pain.  She does not report any swelling redness or any drainage.  No injuries.  She did injure her left big toenail couple months ago and she has blood underneath it because it is growing out she is not concerned about today now there is no pain with it and no signs of infection she reports.  Objective: AAO x3, NAD DP/PT pulses palpable bilaterally, CRT less than 3 seconds On the right foot particular the hallux there is no nail present.  There is no pain nailbed is intact there is no ulcerations.  There is no edema, erythema.  The left hallux toenail is some dried blood present under the toenail there is some clearing on the very proximal nail fold.  No signs of infection again noted today. No pain with calf compression, swelling, warmth, erythema  Assessment: Onychodystrophy right hallux toenail with subungual hematoma left hallux toenail  Plan: -All treatment options discussed with the patient including all alternatives, risks, complications.  -On the left side I do expect the nail to continue to grow out.  Monitoring signs or symptoms of infection. -There is no nail present on the right to evaluate today.  Discussed biotin supplements as the nail grows out to take a picture or to come back in so we can further evaluate this.  She says the nail grows out it will  split we discussed the nail strengthener/partner if this were to split as well.  Monitoring signs or symptoms of infection. -Patient encouraged to call the office with any questions, concerns, change in symptoms.   Jody Turner DPM

## 2024-03-24 ENCOUNTER — Ambulatory Visit: Admission: EM | Admit: 2024-03-24 | Discharge: 2024-03-24 | Disposition: A | Discharge status: Home / Self Care

## 2024-03-24 ENCOUNTER — Encounter: Payer: Self-pay | Admitting: Emergency Medicine

## 2024-03-24 DIAGNOSIS — Z20822 Contact with and (suspected) exposure to covid-19: Secondary | ICD-10-CM | POA: Diagnosis present

## 2024-03-24 LAB — SARS CORONAVIRUS 2 BY RT PCR: SARS Coronavirus 2 by RT PCR: NEGATIVE

## 2024-03-24 NOTE — ED Triage Notes (Signed)
 Patient states that her husbandwas diagnosed with COVID here yesterday.  Patient c/o cough, congestion, fatigue and sinus pressure that started this morning.  Patient unsure of fevers.

## 2024-03-24 NOTE — Discharge Instructions (Signed)
  1. Exposure to COVID-19 virus (Primary) - SARS Coronavirus 2 by RT PCR complaint UC is negative for COVID-19. -Continue to monitor symptoms for any change in severity if there is any escalation of current symptoms or development of new symptoms follow-up in ER for further evaluation and management.

## 2024-03-24 NOTE — ED Provider Notes (Signed)
 [facility]  Note:  This document was prepared using Conservation officer, historic buildings and may include unintentional dictation errors.  MRN: [id number] DOB: 10-21-1960  Subjective:   Jody Turner is a 63 y.o. female presenting for COVID testing in urgent care after exposure to her husband who tested positive for COVID yesterday.  Patient reports that she has some mild cough, nasal congestion, fatigue and sinus congestion that started this morning.  Patient denies any fever, shortness of breath, chest pain, weakness, dizziness.  Patient denies taking any over-the-counter medication to treat symptoms.  No current facility-administered medications for this encounter.  Current Outpatient Medications:    Dupilumab 300 MG/2ML SOAJ, Inject 300 mg into the skin., Disp: , Rfl:    Adalimumab 40 MG/0.4ML PNKT, Inject 80mg  SQ on Day 1, 40mg  on Day 8, then 40mg  every other week Quantity 4 Maintenance: Inject 40mg  SQ every other week Quantity 2, Disp: , Rfl:    albuterol  (PROVENTIL  HFA;VENTOLIN  HFA) 108 (90 Base) MCG/ACT inhaler, Inhale into the lungs., Disp: , Rfl:    brimonidine (ALPHAGAN) 0.2 % ophthalmic solution, Place 1 drop into both eyes every 8 hours., Disp: , Rfl:    busPIRone (BUSPAR) 15 MG tablet, Take by mouth., Disp: , Rfl:    BUSPIRONE HCL PO, Take by mouth., Disp: , Rfl:    citalopram  (CELEXA ) 20 MG tablet, Take 20 mg by mouth daily., Disp: , Rfl:    cycloSPORINE modified (NEORAL) 25 MG capsule, Take 75 mg by mouth 2 (two) times daily., Disp: , Rfl:    diclofenac  sodium (VOLTAREN ) 1 % GEL, Apply 2 g topically 4 (four) times daily. Rub into affected area of foot 2 to 4 times daily, Disp: 100 g, Rfl: 2   estradiol (VIVELLE-DOT) 0.025 MG/24HR, estradiol 0.025 mg/24 hr semiweekly transdermal patch, Disp: , Rfl:    fluticasone (FLONASE) 50 MCG/ACT nasal spray, Place into the nose., Disp: , Rfl:    gabapentin  (NEURONTIN ) 100 MG capsule, Take 1 capsule (100 mg total) by mouth at bedtime.  (Patient not taking: Reported on 11/21/2023), Disp: 90 capsule, Rfl: 3   HYDROcodone -acetaminophen  (NORCO/VICODIN) 5-325 MG tablet, Limit one half to one tablet by mouth per day if tolerated, Disp: 30 tablet, Rfl: 0   ipratropium (ATROVENT ) 0.06 % nasal spray, Place 2 sprays into both nostrils 4 (four) times daily., Disp: 15 mL, Rfl: 12   lidocaine  (LIDODERM ) 5 %, PLEASE SEE ATTACHED FOR DETAILED DIRECTIONS, Disp: , Rfl:    Loratadine 10 MG CAPS, Take by mouth., Disp: , Rfl:    meloxicam  (MOBIC ) 7.5 MG tablet, meloxicam  7.5 mg tablet  Take 1 tablet twice a day by oral route with meals., Disp: 30 tablet, Rfl: 0   methocarbamol  (ROBAXIN ) 500 MG tablet, Take 1 tablet (500 mg total) by mouth 2 (two) times daily. (Patient not taking: Reported on 11/21/2023), Disp: 30 tablet, Rfl: 0   progesterone (PROMETRIUM) 100 MG capsule, progesterone micronized 100 mg capsule, Disp: , Rfl:    propranolol  (INDERAL ) 20 MG tablet, Take 1 tablet (20 mg total) by mouth 3 (three) times daily as needed (for fast heart rate)., Disp: 90 tablet, Rfl: 2   spironolactone (ALDACTONE) 25 MG tablet, Take 25 mg by mouth daily., Disp: , Rfl:    timolol (TIMOPTIC) 0.5 % ophthalmic solution, 1 drop 2 (two) times daily., Disp: , Rfl:    tiZANidine  (ZANAFLEX ) 2 MG tablet, Take by mouth., Disp: , Rfl:    topiramate  (TOPAMAX ) 25 MG capsule, Take 50 mg by  mouth 2 (two) times daily., Disp: , Rfl:    Vitamin D , Ergocalciferol , (DRISDOL ) 50000 UNITS CAPS, Take 50,000 Units by mouth every 7 (seven) days. Friday, Disp: , Rfl:    Allergies  Allergen Reactions   Sulfa Antibiotics Shortness Of Breath   Sulfasalazine Shortness Of Breath and Other (See Comments)   Azathioprine Nausea And Vomiting and Other (See Comments)   Morphine  Sulfate Other (See Comments)   Gluten Itching    Bloating    Gluten Meal Itching and Other (See Comments)    Bloating   Bloating  Bloating , Bloating , Bloating   Meperidine  Hcl Other (See Comments)     meperidine    Meperidine  Hcl Other (See Comments) and Nausea Only    Patient can take this but needs medication for nausea with it.  Medicated  with zofran  pre-procedure and given vistaril  with demeral.  Pt was not nauseated upon discharge.   Morphine  Nausea Only and Other (See Comments)    Patient can take this but needs medication for nausea with it   Morphine  And Codeine Nausea And Vomiting    Patient can take this but needs medication for nausea with it    Past Medical History:  Diagnosis Date   Arthritis    HNP- lumbar, DJD, spondylosis   Basal cell carcinoma 12/07/2021   L prox nasolabial - ED&C 02/02/22   Basal cell carcinoma 2015   Neck - treated many years ago by Dr. Dela   Cancer [facility])    r side of neck- basal cell ca   Depression    depression, trouble sleeping    Fibromyalgia    no medical treatment - treats naturally /w exercise    Hypertension    Osteoarthritis      Past Surgical History:  Procedure Laterality Date   BACK SURGERY     corpectomy, cerv. fusion -2004   CARPAL TUNNEL RELEASE Right    CATARACT EXTRACTION W/PHACO Left 11/26/2019   Procedure: CATARACT EXTRACTION PHACO AND INTRAOCULAR LENS PLACEMENT (IOC) LEFT INJECTION OF INTRAVITREAL KENALOG ;  Surgeon: Myrna Adine Anes, MD;  Location: [facility];  Service: Ophthalmology;  Laterality: Left;  1.18 0:21.7   CATARACT EXTRACTION W/PHACO Right 12/31/2019   Procedure: CATARACT EXTRACTION PHACO AND INTRAOCULAR LENS PLACEMENT (IOC) RIGHT INTRAVITREAL KENALOG  INJ 0.64  00:15.9;  Surgeon: Myrna Adine Anes, MD;  Location: [facility];  Service: Ophthalmology;  Laterality: Right;   FOOT OSTEOTOMY W/ PLANTAR FASCIA RELEASE     R foot, Coton Evans procedure    LASER ABLATION     both legs, sclerotherapy    LUMBAR LAMINECTOMY/DECOMPRESSION MICRODISCECTOMY  09/17/2011   Procedure: LUMBAR LAMINECTOMY/DECOMPRESSION MICRODISCECTOMY;  Surgeon: Catalina CHRISTELLA Stains, MD;  Location: [facility];  Service:  Neurosurgery;  Laterality: N/A;  Left Lumbar three-four, Right Lumbar four-five Discectomies   TONSILLECTOMY     as a child    VAGINAL DELIVERY     VARICOSE VEIN SURGERY     segmental, bilateral     Family History  Problem Relation Age of Onset   Hypertension Mother    Thyroid disease Mother    Depression Mother    Heart failure Father    Emphysema Brother    Stroke Maternal Grandmother    Other Maternal Grandmother    Anesthesia problems Neg Hx    Hypotension Neg Hx    Malignant hyperthermia Neg Hx    Pseudochol deficiency Neg Hx    Breast cancer Neg Hx     Social  History   Tobacco Use   Smoking status: Never   Smokeless tobacco: Never  Vaping Use   Vaping status: Never Used  Substance Use Topics   Alcohol use: No   Drug use: No    ROS Refer to HPI for ROS details.  Objective:   Vitals: BP 129/85 (BP Location: Right Arm)   Pulse 68   Temp 98.2 F (36.8 C) (Oral)   Resp 15   Ht 5' 6 (1.676 m)   Wt 269 lb 2.9 oz (122.1 kg)   LMP 08/27/2011   SpO2 94%   BMI 43.45 kg/m   Physical Exam Vitals and nursing note reviewed.  Constitutional:      General: She is not in acute distress.    Appearance: She is well-developed. She is not ill-appearing or toxic-appearing.  HENT:     Head: Normocephalic and atraumatic.     Nose: Congestion and rhinorrhea present.     Mouth/Throat:     Mouth: Mucous membranes are moist.  Eyes:     General:        Right eye: No discharge.        Left eye: No discharge.     Extraocular Movements: Extraocular movements intact.     Conjunctiva/sclera: Conjunctivae normal.  Cardiovascular:     Rate and Rhythm: Normal rate.  Pulmonary:     Effort: Pulmonary effort is normal. No respiratory distress.     Breath sounds: No stridor. No wheezing.  Chest:     Chest wall: No tenderness.  Skin:    General: Skin is warm and dry.  Neurological:     General: No focal deficit present.     Mental Status: She is alert and oriented to  person, place, and time.  Psychiatric:        Mood and Affect: Mood normal.        Behavior: Behavior normal.     Procedures  Results for orders placed or performed during the hospital encounter of 03/24/24 (from the past 24 hours)  SARS Coronavirus 2 by RT PCR (hospital order, performed in [facility] hospital lab) *cepheid single result test* Anterior Nasal Swab     Status: None   Collection Time: 03/24/24 11:49 AM   Specimen: Anterior Nasal Swab  Result Value Ref Range   SARS Coronavirus 2 by RT PCR NEGATIVE NEGATIVE    Assessment and Plan :     Discharge Instructions       1. Exposure to COVID-19 virus (Primary) - SARS Coronavirus 2 by RT PCR complaint UC is negative for COVID-19. -Continue to monitor symptoms for any change in severity if there is any escalation of current symptoms or development of new symptoms follow-up in ER for further evaluation and management.      Yosselin Zoeller B Laurana Magistro   [place]wood Becraft, [place], [place] 03/24/24 1257

## 2024-04-05 ENCOUNTER — Ambulatory Visit: Payer: Medicare HMO | Admitting: Dermatology

## 2024-05-23 ENCOUNTER — Other Ambulatory Visit: Payer: Self-pay | Admitting: Family Medicine

## 2024-05-23 DIAGNOSIS — Z1231 Encounter for screening mammogram for malignant neoplasm of breast: Secondary | ICD-10-CM

## 2024-06-22 ENCOUNTER — Ambulatory Visit
Admission: RE | Admit: 2024-06-22 | Discharge: 2024-06-22 | Disposition: A | Discharge status: Home / Self Care | Payer: Self-pay | Source: Ambulatory Visit | Attending: Family Medicine | Admitting: Family Medicine

## 2024-06-22 VITALS — BP 127/75 | HR 57 | Temp 98.6°F | Resp 18

## 2024-06-22 DIAGNOSIS — B349 Viral infection, unspecified: Secondary | ICD-10-CM

## 2024-06-22 DIAGNOSIS — J4521 Mild intermittent asthma with (acute) exacerbation: Secondary | ICD-10-CM

## 2024-06-22 DIAGNOSIS — R0602 Shortness of breath: Secondary | ICD-10-CM

## 2024-06-22 DIAGNOSIS — J029 Acute pharyngitis, unspecified: Secondary | ICD-10-CM

## 2024-06-22 LAB — POCT RAPID STREP A (OFFICE): Rapid Strep A Screen: NEGATIVE

## 2024-06-22 LAB — POCT INFLUENZA A/B
Influenza A, POC: NEGATIVE
Influenza B, POC: NEGATIVE

## 2024-06-22 LAB — POC SOFIA SARS ANTIGEN FIA: SARS Coronavirus 2 Ag: NEGATIVE

## 2024-06-22 MED ORDER — IPRATROPIUM-ALBUTEROL 0.5-2.5 (3) MG/3ML IN SOLN
3.0000 mL | Freq: Once | RESPIRATORY_TRACT | Status: AC
  Administered 2024-06-22: 3 mL via RESPIRATORY_TRACT

## 2024-06-22 MED ORDER — ALBUTEROL SULFATE HFA 108 (90 BASE) MCG/ACT IN AERS: 1.0000 | INHALATION_SPRAY | Freq: Four times a day (QID) | RESPIRATORY_TRACT | 0 refills | Status: AC | PRN

## 2024-06-22 MED ORDER — PREDNISONE 20 MG PO TABS: 40.0000 mg | ORAL_TABLET | Freq: Every day | ORAL | 0 refills | Status: AC

## 2024-06-22 MED ORDER — BENZONATATE 100 MG PO CAPS: 100.0000 mg | ORAL_CAPSULE | Freq: Three times a day (TID) | ORAL | 0 refills | Status: AC

## 2024-06-22 NOTE — ED Triage Notes (Signed)
 Pt reports cough, sore throat, wheezing and SOB x 3 days hx of asthma. Pt states she has been treating with OTC meds but has found no relief.

## 2024-06-22 NOTE — ED Provider Notes (Signed)
 [facility]    CSN: [id number] Arrival date & time: 06/22/24  1339      History   Chief Complaint Chief Complaint  Patient presents with   Cough    sore throat, wheezing, ear fullness for past 3 days. - Entered by patient    HPI Jody Turner is a 63 y.o. female  presents for evaluation of URI symptoms for 3 days. Patient reports associated symptoms of cough, congestion, sore throat, shortness of breath and wheezing. Denies N/V/D, [place], body aches, ear pain. Patient does have a hx of asthma.  Only has albuterol  inhaler which she has been using with temporary improvement.  Patient is not an active smoker.   Reports no known sick contacts.  Pt has taken Mucinex OTC for symptoms. Pt has no other concerns at this time.     Cough Associated symptoms: shortness of breath, sore throat and wheezing     Past Medical History:  Diagnosis Date   A-fib (HCC)    Arthritis    HNP- lumbar, DJD, spondylosis   Basal cell carcinoma 12/07/2021   L prox nasolabial - ED&C 02/02/22   Basal cell carcinoma 2015   Neck - treated many years ago by Dr. Dela   Cancer [facility])    r side of neck- basal cell ca   Depression    depression, trouble sleeping    Fibromyalgia    no medical treatment - treats naturally /w exercise    Hypertension    Osteoarthritis     Patient Active Problem List   Diagnosis Date Noted   Depressive disorder 01/05/2021   Disorder of bursae of shoulder region 01/05/2021   Enthesopathy of hip region 01/05/2021   Insomnia 01/05/2021   Low back pain 01/05/2021   Adalimumab (Humira) long-term use 08/25/2020   Allergies 08/25/2020   Anxiety 08/25/2020   Asthma 08/25/2020   Essential (primary) hypertension 08/25/2020   Fibromyalgia 08/25/2020   Glaucoma 08/25/2020   Hormone replacement therapy 08/25/2020   Migraines 08/25/2020   Morbid obesity with BMI of 40.0-44.9, adult (HCC) 08/25/2020   Narcotic drug use 08/25/2020   Postmenopausal 08/25/2020    Vitamin D  deficiency 08/25/2020   Primary osteoarthritis of right knee 11/07/2019   High risk medication use 12/13/2017   Nuclear sclerotic cataract of both eyes 12/13/2017   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   Posterior synechiae (iris), left eye 12/13/2017   Retinal edema 12/13/2017   Chronic neck and back pain 04/05/2017   S/P cervical spinal fusion 04/05/2017   S/P lumbar laminectomy 04/05/2017   Onychomycosis 05/01/2016   Chronic left shoulder pain 10/06/2015   Greater trochanteric bursitis 08/21/2015   DDD (degenerative disc disease), lumbar 12/29/2014   DDD (degenerative disc disease), cervical 12/29/2014   Sacroiliac joint disease 12/29/2014   Lumbar facet arthropathy 12/29/2014   Bilateral occipital neuralgia 12/29/2014    Past Surgical History:  Procedure Laterality Date   BACK SURGERY     corpectomy, cerv. fusion -2004   CARPAL TUNNEL RELEASE Right    CATARACT EXTRACTION W/PHACO Left 11/26/2019   Procedure: CATARACT EXTRACTION PHACO AND INTRAOCULAR LENS PLACEMENT (IOC) LEFT INJECTION OF INTRAVITREAL KENALOG ;  Surgeon: Myrna Adine Anes, MD;  Location: [facility];  Service: Ophthalmology;  Laterality: Left;  1.18 0:21.7   CATARACT EXTRACTION W/PHACO Right 12/31/2019   Procedure: CATARACT EXTRACTION PHACO AND INTRAOCULAR LENS PLACEMENT (IOC) RIGHT INTRAVITREAL KENALOG  INJ 0.64  00:15.9;  Surgeon: Myrna,  Adine Anes, MD;  Location: [facility];  Service: Ophthalmology;  Laterality: Right;   FOOT OSTEOTOMY W/ PLANTAR FASCIA RELEASE     R foot, Coton Evans procedure    LASER ABLATION     both legs, sclerotherapy    LUMBAR LAMINECTOMY/DECOMPRESSION MICRODISCECTOMY  09/17/2011   Procedure: LUMBAR LAMINECTOMY/DECOMPRESSION MICRODISCECTOMY;  Surgeon: Catalina CHRISTELLA Stains, MD;  Location: [facility];  Service: Neurosurgery;  Laterality: N/A;  Left Lumbar three-four, Right Lumbar four-five  Discectomies   TONSILLECTOMY     as a child    VAGINAL DELIVERY     VARICOSE VEIN SURGERY     segmental, bilateral     OB History   No obstetric history on file.      Home Medications    Prior to Admission medications   Medication Sig Start Date End Date Taking? Authorizing Provider  albuterol  (VENTOLIN  HFA) 108 (90 Base) MCG/ACT inhaler Inhale 1-2 puffs into the lungs every 6 (six) hours as needed for wheezing or shortness of breath. 06/22/24  Yes Shalandra Leu, Jodi R, NP  benzonatate  (TESSALON ) 100 MG capsule Take 1 capsule (100 mg total) by mouth every 8 (eight) hours. 06/22/24  Yes Hiilani Jetter, Jodi R, NP  predniSONE  (DELTASONE ) 20 MG tablet Take 2 tablets (40 mg total) by mouth daily with breakfast for 5 days. 06/22/24 06/27/24 Yes Coron Rossano, Jodi R, NP  Adalimumab 40 MG/0.4ML PNKT Inject 80mg  SQ on Day 1, 40mg  on Day 8, then 40mg  every other week Quantity 4 Maintenance: Inject 40mg  SQ every other week Quantity 2 02/10/18   [provider]  brimonidine (ALPHAGAN) 0.2 % ophthalmic solution Place 1 drop into both eyes every 8 hours. 11/11/20   [provider]  busPIRone (BUSPAR) 15 MG tablet Take by mouth. 06/12/20   [provider]  BUSPIRONE HCL PO Take by mouth.    [provider]  citalopram  (CELEXA ) 20 MG tablet Take 20 mg by mouth daily.    [provider]  cycloSPORINE modified (NEORAL) 25 MG capsule Take 75 mg by mouth 2 (two) times daily. 12/07/20   [provider]  diclofenac  sodium (VOLTAREN ) 1 % GEL Apply 2 g topically 4 (four) times daily. Rub into affected area of foot 2 to 4 times daily 10/20/16   Gershon Donnice SAUNDERS, DPM  Dupilumab 300 MG/2ML SOAJ Inject 300 mg into the skin. 08/26/23   [provider]  estradiol (VIVELLE-DOT) 0.025 MG/24HR estradiol 0.025 mg/24 hr semiweekly transdermal patch 06/11/20   [provider]  fluticasone (FLONASE) 50 MCG/ACT nasal spray Place into the nose. 11/19/16   [provider]   gabapentin  (NEURONTIN ) 100 MG capsule Take 1 capsule (100 mg total) by mouth at bedtime. Patient not taking: Reported on 11/21/2023 05/10/22   Gershon Donnice SAUNDERS, DPM  HYDROcodone -acetaminophen  (NORCO/VICODIN) 5-325 MG tablet Limit one half to one tablet by mouth per day if tolerated 03/08/16   Dannial Hacker, MD  ipratropium (ATROVENT ) 0.06 % nasal spray Place 2 sprays into both nostrils 4 (four) times daily. 08/08/21   Bernardino Ditch, NP  lidocaine  (LIDODERM ) 5 % PLEASE SEE ATTACHED FOR DETAILED DIRECTIONS 10/08/20   [provider]  Loratadine 10 MG CAPS Take by mouth.    [provider]  meloxicam  (MOBIC ) 7.5 MG tablet meloxicam  7.5 mg tablet  Take 1 tablet twice a day by oral route with meals. 12/08/22   Brimage, Vondra, DO  methocarbamol  (ROBAXIN ) 500 MG tablet Take 1 tablet (500 mg total) by mouth 2 (two) times  daily. Patient not taking: Reported on 11/21/2023 12/08/22   Brimage, Vondra, DO  progesterone (PROMETRIUM) 100 MG capsule progesterone micronized 100 mg capsule 06/11/20   [provider]  propranolol  (INDERAL ) 20 MG tablet Take 1 tablet (20 mg total) by mouth 3 (three) times daily as needed (for fast heart rate). 11/21/23   Gollan, Timothy J, MD  spironolactone (ALDACTONE) 25 MG tablet Take 25 mg by mouth daily.    [provider]  timolol (TIMOPTIC) 0.5 % ophthalmic solution 1 drop 2 (two) times daily. 12/19/20   [provider]  tiZANidine  (ZANAFLEX ) 2 MG tablet Take by mouth. 12/31/20   [provider]  topiramate  (TOPAMAX ) 25 MG capsule Take 50 mg by mouth 2 (two) times daily.    [provider]  Vitamin D , Ergocalciferol , (DRISDOL ) 50000 UNITS CAPS Take 50,000 Units by mouth every 7 (seven) days. Friday    [provider]    Family History Family History  Problem Relation Age of Onset   Hypertension Mother    Thyroid disease Mother    Depression Mother    Heart failure Father    Emphysema Brother    Stroke  Maternal Grandmother    Other Maternal Grandmother    Anesthesia problems Neg Hx    Hypotension Neg Hx    Malignant hyperthermia Neg Hx    Pseudochol deficiency Neg Hx    Breast cancer Neg Hx     Social History Social History   Tobacco Use   Smoking status: Never   Smokeless tobacco: Never  Vaping Use   Vaping status: Never Used  Substance Use Topics   Alcohol use: No   Drug use: No     Allergies   Sulfa antibiotics, Sulfasalazine, Azathioprine, Morphine  sulfate, Gluten, Gluten meal, Meperidine  hcl, Meperidine  hcl, Morphine , and Morphine  and codeine   Review of Systems Review of Systems  HENT:  Positive for congestion and sore throat.   Respiratory:  Positive for cough, shortness of breath and wheezing.      Physical Exam Triage Vital Signs ED Triage Vitals  Encounter Vitals Group     BP 06/22/24 1345 127/75     Girls Systolic BP Percentile --      Girls Diastolic BP Percentile --      Boys Systolic BP Percentile --      Boys Diastolic BP Percentile --      Pulse Rate 06/22/24 1345 (!) 57     Resp 06/22/24 1345 18     Temp 06/22/24 1345 98.6 F (37 C)     Temp Source 06/22/24 1345 Oral     SpO2 06/22/24 1345 97 %     Weight --      Height --      Head Circumference --      Peak Flow --      Pain Score 06/22/24 1347 5     Pain Loc --      Pain Education --      Exclude from Growth Chart --    No data found.  Updated Vital Signs BP 127/75 (BP Location: Right Arm)   Pulse (!) 57   Temp 98.6 F (37 C) (Oral)   Resp 18   LMP 08/27/2011   SpO2 97%   Visual Acuity Right Eye Distance:   Left Eye Distance:   Bilateral Distance:    Right Eye Near:   Left Eye Near:    Bilateral Near:     Physical Exam Vitals and  nursing note reviewed.  Constitutional:      General: She is not in acute distress.    Appearance: She is well-developed. She is not ill-appearing.  HENT:     Head: Normocephalic and atraumatic.     Right Ear: Tympanic membrane and ear  canal normal.     Left Ear: Tympanic membrane and ear canal normal.     Nose: Congestion present.     Mouth/Throat:     Mouth: Mucous membranes are moist.     Pharynx: Oropharynx is clear. Uvula midline. Posterior oropharyngeal erythema present.     Tonsils: No tonsillar exudate or tonsillar abscesses.  Eyes:     Conjunctiva/sclera: Conjunctivae normal.     Pupils: Pupils are equal, round, and reactive to light.  Cardiovascular:     Rate and Rhythm: Regular rhythm. Bradycardia present.     Heart sounds: Normal heart sounds.     Comments: Heart rate 57 Pulmonary:     Effort: Pulmonary effort is normal.     Breath sounds: Normal breath sounds. No wheezing.  Musculoskeletal:     Cervical back: Normal range of motion and neck supple.  Lymphadenopathy:     Cervical: No cervical adenopathy.  Skin:    General: Skin is warm and dry.  Neurological:     General: No focal deficit present.     Mental Status: She is alert and oriented to person, place, and time.  Psychiatric:        Mood and Affect: Mood normal.        Behavior: Behavior normal.      UC Treatments / Results  Labs (all labs ordered are listed, but only abnormal results are displayed) Labs Reviewed  POCT RAPID STREP A (OFFICE) - Normal  POCT INFLUENZA A/B - Normal  POC SOFIA SARS ANTIGEN FIA - Normal    EKG   Radiology No results found.  Procedures Procedures (including critical care time)  Medications Ordered in UC Medications  ipratropium-albuterol  (DUONEB) 0.5-2.5 (3) MG/3ML nebulizer solution 3 mL (3 mLs Nebulization Given 06/22/24 1423)    Initial Impression / Assessment and Plan / UC Course  I have reviewed the triage vital signs and the nursing notes.  Pertinent labs & imaging results that were available during my care of the patient were reviewed by me and considered in my medical decision making (see chart for details).     Patient given DuoNeb in clinic for symptom relief.  Negative COVID flu  and strep throat testing.  Discussed viral illness and asthma exacerbation.  Refilled albuterol  inhaler will do prednisone  daily for 5 days.  Tessalon  as needed for cough.  Encourage rest fluids and PCP follow-up in 2 to 3 days for recheck.  ER precautions reviewed and patient verbalized understanding. Final Clinical Impressions(s) / UC Diagnoses   Final diagnoses:  Sore throat  Shortness of breath  Viral illness  Mild intermittent asthma with acute exacerbation     Discharge Instructions      You tested negative for COVID, flu, strep throat.  I have refilled your albuterol  inhaler to use as needed for wheezing or shortness of breath.  You may take Tessalon  3 times a day as needed for cough.  Start prednisone  daily for 5 days for your asthma symptoms.  Lots of rest and fluids.  Please follow-up with your PCP in 2 to 3 days for recheck.  Please go to the emergency room if you develop any worsening symptoms.  Hope you feel better soon!  ED Prescriptions     Medication Sig Dispense Auth. Provider   albuterol  (VENTOLIN  HFA) 108 (90 Base) MCG/ACT inhaler Inhale 1-2 puffs into the lungs every 6 (six) hours as needed for wheezing or shortness of breath. 1 each Loreda Myla SAUNDERS, NP   predniSONE  (DELTASONE ) 20 MG tablet Take 2 tablets (40 mg total) by mouth daily with breakfast for 5 days. 10 tablet Mischa Pollard, Jodi R, NP   benzonatate  (TESSALON ) 100 MG capsule Take 1 capsule (100 mg total) by mouth every 8 (eight) hours. 21 capsule Mylena Sedberry, Jodi R, NP      PDMP not reviewed this encounter.   Loreda Myla SAUNDERS, NP 06/22/24 1452

## 2024-06-22 NOTE — Discharge Instructions (Addendum)
 You tested negative for COVID, flu, strep throat.  I have refilled your albuterol  inhaler to use as needed for wheezing or shortness of breath.  You may take Tessalon  3 times a day as needed for cough.  Start prednisone  daily for 5 days for your asthma symptoms.  Lots of rest and fluids.  Please follow-up with your PCP in 2 to 3 days for recheck.  Please go to the emergency room if you develop any worsening symptoms.  Hope you feel better soon!

## 2024-06-29 ENCOUNTER — Ambulatory Visit
Admission: RE | Admit: 2024-06-29 | Discharge: 2024-06-29 | Disposition: A | Source: Ambulatory Visit | Attending: Family Medicine | Admitting: Family Medicine

## 2024-06-29 DIAGNOSIS — Z1231 Encounter for screening mammogram for malignant neoplasm of breast: Secondary | ICD-10-CM

## 2024-07-03 ENCOUNTER — Ambulatory Visit: Admitting: Dermatology

## 2024-08-20 ENCOUNTER — Ambulatory Visit: Admitting: Dermatology

## 2024-09-18 ENCOUNTER — Ambulatory Visit: Admitting: Dermatology

## 2024-11-22 ENCOUNTER — Ambulatory Visit: Admitting: Dermatology
